# Patient Record
Sex: Female | Born: 1980 | Race: Black or African American | Hispanic: No | Marital: Married | Smoking: Never smoker
Health system: Southern US, Community
[De-identification: ages and names within clinical notes are randomized; demographics above are authoritative.]

## PROBLEM LIST (undated history)

## (undated) ENCOUNTER — Inpatient Hospital Stay (HOSPITAL_COMMUNITY): Payer: Self-pay

## (undated) DIAGNOSIS — R87619 Unspecified abnormal cytological findings in specimens from cervix uteri: Secondary | ICD-10-CM

## (undated) DIAGNOSIS — O24419 Gestational diabetes mellitus in pregnancy, unspecified control: Secondary | ICD-10-CM

## (undated) DIAGNOSIS — IMO0002 Reserved for concepts with insufficient information to code with codable children: Secondary | ICD-10-CM

## (undated) HISTORY — DX: Reserved for concepts with insufficient information to code with codable children: IMO0002

## (undated) HISTORY — PX: NO PAST SURGERIES: SHX2092

## (undated) HISTORY — DX: Unspecified abnormal cytological findings in specimens from cervix uteri: R87.619

---

## 2004-04-29 LAB — SICKLE CELL SCREEN: Sickle Cell Screen: NEGATIVE

## 2004-05-01 ENCOUNTER — Ambulatory Visit (HOSPITAL_COMMUNITY): Admission: RE | Admit: 2004-05-01 | Discharge: 2004-05-01 | Payer: Self-pay | Admitting: *Deleted

## 2004-06-02 ENCOUNTER — Inpatient Hospital Stay (HOSPITAL_COMMUNITY): Admission: AD | Admit: 2004-06-02 | Discharge: 2004-06-02 | Payer: Self-pay | Admitting: Obstetrics and Gynecology

## 2004-06-02 ENCOUNTER — Inpatient Hospital Stay (HOSPITAL_COMMUNITY): Admission: AD | Admit: 2004-06-02 | Discharge: 2004-06-02 | Payer: Self-pay | Admitting: *Deleted

## 2004-06-12 ENCOUNTER — Ambulatory Visit: Payer: Self-pay | Admitting: Family Medicine

## 2004-06-26 ENCOUNTER — Ambulatory Visit: Payer: Self-pay | Admitting: Family Medicine

## 2004-07-10 ENCOUNTER — Ambulatory Visit: Payer: Self-pay | Admitting: Family Medicine

## 2004-07-16 ENCOUNTER — Ambulatory Visit: Payer: Self-pay | Admitting: *Deleted

## 2004-07-23 ENCOUNTER — Ambulatory Visit: Payer: Self-pay | Admitting: *Deleted

## 2004-07-30 ENCOUNTER — Ambulatory Visit: Payer: Self-pay | Admitting: *Deleted

## 2004-08-06 ENCOUNTER — Ambulatory Visit: Payer: Self-pay | Admitting: Obstetrics & Gynecology

## 2004-08-13 ENCOUNTER — Inpatient Hospital Stay (HOSPITAL_COMMUNITY): Admission: AD | Admit: 2004-08-13 | Discharge: 2004-08-16 | Payer: Self-pay | Admitting: Obstetrics and Gynecology

## 2004-08-13 ENCOUNTER — Ambulatory Visit: Payer: Self-pay | Admitting: Obstetrics & Gynecology

## 2004-08-14 ENCOUNTER — Ambulatory Visit: Payer: Self-pay | Admitting: Obstetrics and Gynecology

## 2006-01-07 ENCOUNTER — Ambulatory Visit (HOSPITAL_COMMUNITY): Admission: RE | Admit: 2006-01-07 | Discharge: 2006-01-07 | Payer: Self-pay | Admitting: *Deleted

## 2006-01-19 ENCOUNTER — Ambulatory Visit (HOSPITAL_COMMUNITY): Admission: RE | Admit: 2006-01-19 | Discharge: 2006-01-19 | Payer: Self-pay | Admitting: *Deleted

## 2006-05-27 ENCOUNTER — Inpatient Hospital Stay (HOSPITAL_COMMUNITY): Admission: AD | Admit: 2006-05-27 | Discharge: 2006-05-27 | Payer: Self-pay | Admitting: Obstetrics and Gynecology

## 2006-05-27 ENCOUNTER — Ambulatory Visit: Payer: Self-pay | Admitting: Obstetrics and Gynecology

## 2006-06-03 ENCOUNTER — Ambulatory Visit: Payer: Self-pay | Admitting: *Deleted

## 2006-06-03 ENCOUNTER — Inpatient Hospital Stay (HOSPITAL_COMMUNITY): Admission: RE | Admit: 2006-06-03 | Discharge: 2006-06-06 | Payer: Self-pay | Admitting: Obstetrics & Gynecology

## 2007-10-25 ENCOUNTER — Inpatient Hospital Stay (HOSPITAL_COMMUNITY): Admission: AD | Admit: 2007-10-25 | Discharge: 2007-10-25 | Payer: Self-pay | Admitting: Obstetrics & Gynecology

## 2008-03-23 ENCOUNTER — Ambulatory Visit (HOSPITAL_COMMUNITY): Admission: RE | Admit: 2008-03-23 | Discharge: 2008-03-23 | Payer: Self-pay | Admitting: Family Medicine

## 2008-03-26 ENCOUNTER — Ambulatory Visit: Payer: Self-pay | Admitting: Obstetrics & Gynecology

## 2008-03-26 ENCOUNTER — Encounter: Admission: RE | Admit: 2008-03-26 | Discharge: 2008-05-21 | Payer: Self-pay | Admitting: Obstetrics & Gynecology

## 2008-04-02 ENCOUNTER — Ambulatory Visit: Payer: Self-pay | Admitting: Family Medicine

## 2008-04-12 ENCOUNTER — Ambulatory Visit: Payer: Self-pay | Admitting: Family Medicine

## 2008-04-16 ENCOUNTER — Ambulatory Visit: Payer: Self-pay | Admitting: Obstetrics & Gynecology

## 2008-04-23 ENCOUNTER — Ambulatory Visit (HOSPITAL_COMMUNITY): Admission: RE | Admit: 2008-04-23 | Discharge: 2008-04-23 | Payer: Self-pay | Admitting: Obstetrics & Gynecology

## 2008-04-23 ENCOUNTER — Ambulatory Visit: Payer: Self-pay | Admitting: Obstetrics & Gynecology

## 2008-04-26 ENCOUNTER — Ambulatory Visit: Payer: Self-pay | Admitting: Gynecology

## 2008-04-30 ENCOUNTER — Ambulatory Visit: Payer: Self-pay | Admitting: Family Medicine

## 2008-05-07 ENCOUNTER — Ambulatory Visit: Payer: Self-pay | Admitting: Obstetrics & Gynecology

## 2008-05-10 ENCOUNTER — Ambulatory Visit: Payer: Self-pay | Admitting: Family Medicine

## 2008-05-14 ENCOUNTER — Ambulatory Visit: Payer: Self-pay | Admitting: Obstetrics & Gynecology

## 2008-05-17 ENCOUNTER — Ambulatory Visit: Payer: Self-pay | Admitting: Obstetrics & Gynecology

## 2008-05-21 ENCOUNTER — Ambulatory Visit: Payer: Self-pay | Admitting: Obstetrics & Gynecology

## 2008-05-24 ENCOUNTER — Ambulatory Visit: Payer: Self-pay | Admitting: Gynecology

## 2008-05-28 ENCOUNTER — Ambulatory Visit (HOSPITAL_COMMUNITY): Admission: RE | Admit: 2008-05-28 | Discharge: 2008-05-28 | Payer: Self-pay | Admitting: Obstetrics & Gynecology

## 2008-05-28 ENCOUNTER — Ambulatory Visit: Payer: Self-pay | Admitting: Obstetrics & Gynecology

## 2008-05-29 ENCOUNTER — Ambulatory Visit: Payer: Self-pay | Admitting: Advanced Practice Midwife

## 2008-05-29 ENCOUNTER — Inpatient Hospital Stay (HOSPITAL_COMMUNITY): Admission: RE | Admit: 2008-05-29 | Discharge: 2008-05-31 | Payer: Self-pay | Admitting: Obstetrics & Gynecology

## 2008-06-08 ENCOUNTER — Ambulatory Visit: Payer: Self-pay | Admitting: Obstetrics & Gynecology

## 2008-06-08 ENCOUNTER — Inpatient Hospital Stay (HOSPITAL_COMMUNITY): Admission: AD | Admit: 2008-06-08 | Discharge: 2008-06-08 | Payer: Self-pay | Admitting: Obstetrics & Gynecology

## 2011-07-01 LAB — CBC
HCT: 37.9
MCHC: 34.9
MCV: 78
Platelets: 283
RDW: 13.1

## 2011-07-01 LAB — URINE MICROSCOPIC-ADD ON

## 2011-07-01 LAB — URINALYSIS, ROUTINE W REFLEX MICROSCOPIC
Bilirubin Urine: NEGATIVE
Glucose, UA: NEGATIVE
Ketones, ur: NEGATIVE
Nitrite: NEGATIVE
Specific Gravity, Urine: 1.01
Urobilinogen, UA: 0.2

## 2011-07-01 LAB — POCT PREGNANCY, URINE
Operator id: 113551
Preg Test, Ur: POSITIVE

## 2011-07-01 LAB — ABO/RH: ABO/RH(D): B POS

## 2011-07-09 LAB — POCT URINALYSIS DIP (DEVICE)
Bilirubin Urine: NEGATIVE
Bilirubin Urine: NEGATIVE
Bilirubin Urine: NEGATIVE
Glucose, UA: 100 — AB
Glucose, UA: NEGATIVE
Glucose, UA: NEGATIVE
Glucose, UA: NEGATIVE
Hgb urine dipstick: NEGATIVE
Hgb urine dipstick: NEGATIVE
Ketones, ur: 15 — AB
Ketones, ur: 15 — AB
Ketones, ur: 15 — AB
Ketones, ur: NEGATIVE
Nitrite: NEGATIVE
Nitrite: NEGATIVE
Nitrite: NEGATIVE
Nitrite: NEGATIVE
Nitrite: NEGATIVE
Operator id: 297281
Operator id: 148111
Operator id: 194561
Protein, ur: NEGATIVE
Specific Gravity, Urine: 1.02
Urobilinogen, UA: 1
pH: 6.5
pH: 7

## 2011-07-10 LAB — POCT URINALYSIS DIP (DEVICE)
Bilirubin Urine: NEGATIVE
Glucose, UA: NEGATIVE
Hgb urine dipstick: NEGATIVE
Hgb urine dipstick: NEGATIVE
Ketones, ur: 15 — AB
Ketones, ur: 40 — AB
Nitrite: NEGATIVE
Nitrite: NEGATIVE
Nitrite: NEGATIVE
Operator id: 194561
Operator id: 297281
Protein, ur: 30 — AB
Protein, ur: NEGATIVE
Protein, ur: NEGATIVE
Specific Gravity, Urine: 1.015
Specific Gravity, Urine: 1.02
Specific Gravity, Urine: 1.01
Urobilinogen, UA: 1
Urobilinogen, UA: 0.2
Urobilinogen, UA: 0.2
pH: 6.5

## 2011-10-13 NOTE — L&D Delivery Note (Addendum)
Delivery Note At 11:24 PM a viable female was delivered via  (Presentation: ;Left Occiput Anterior ) after approximately 30second shoulder dystocia resolved w/ mcroberts and suprapubic pressure.  Spontaneous cry after stimulation on mom's abdomen. APGAR:8/9 ; Weight: 9lb 2.7oz, Placenta status: Intact, Spontaneous.  Cord: 3 vessels with the following complications: None.    Anesthesia: None  Episiotomy: None Lacerations: None Suture Repair: n/a Est. Blood Loss (mL):  Mom to postpartum.  Baby to nursery-stable.  Plans to breast/bottlefeed, wants nexplanon.  Desires circumcision, self pay- undecided about inpatient vs. Outpatient  Marge Duncans 06/25/2012, 11:46 PM

## 2011-12-25 ENCOUNTER — Encounter (HOSPITAL_COMMUNITY): Payer: Self-pay | Admitting: *Deleted

## 2011-12-25 ENCOUNTER — Inpatient Hospital Stay (HOSPITAL_COMMUNITY)
Admission: AD | Admit: 2011-12-25 | Discharge: 2011-12-25 | Disposition: A | Discharge status: Home / Self Care | Payer: Self-pay | Source: Ambulatory Visit | Attending: Obstetrics & Gynecology | Admitting: Obstetrics & Gynecology

## 2011-12-25 DIAGNOSIS — O219 Vomiting of pregnancy, unspecified: Secondary | ICD-10-CM

## 2011-12-25 DIAGNOSIS — O210 Mild hyperemesis gravidarum: Secondary | ICD-10-CM | POA: Insufficient documentation

## 2011-12-25 DIAGNOSIS — Z3480 Encounter for supervision of other normal pregnancy, unspecified trimester: Secondary | ICD-10-CM

## 2011-12-25 DIAGNOSIS — O99891 Other specified diseases and conditions complicating pregnancy: Secondary | ICD-10-CM | POA: Insufficient documentation

## 2011-12-25 DIAGNOSIS — R109 Unspecified abdominal pain: Secondary | ICD-10-CM | POA: Insufficient documentation

## 2011-12-25 HISTORY — DX: Gestational diabetes mellitus in pregnancy, unspecified control: O24.419

## 2011-12-25 LAB — GLUCOSE, CAPILLARY

## 2011-12-25 LAB — URINALYSIS, ROUTINE W REFLEX MICROSCOPIC
Glucose, UA: NEGATIVE mg/dL
Hgb urine dipstick: NEGATIVE
Leukocytes, UA: NEGATIVE
Specific Gravity, Urine: 1.015 (ref 1.005–1.030)
pH: 6 (ref 5.0–8.0)

## 2011-12-25 LAB — POCT PREGNANCY, URINE: Preg Test, Ur: POSITIVE — AB

## 2011-12-25 LAB — WET PREP, GENITAL
Trich, Wet Prep: NONE SEEN
Yeast Wet Prep HPF POC: NONE SEEN

## 2011-12-25 MED ORDER — PRENATAL RX 60-1 MG PO TABS: 1.0000 | ORAL_TABLET | Freq: Every day | ORAL | Status: AC

## 2011-12-25 MED ORDER — PROMETHAZINE HCL 12.5 MG PO TABS: 12.5000 mg | ORAL_TABLET | Freq: Four times a day (QID) | ORAL | Status: AC | PRN

## 2011-12-25 NOTE — Discharge Instructions (Signed)
Prenatal Care [facility]    [facility]  [phone]     Phone: [phone]          [facility]                      [facility]  @[place]  [place]     Phone: [phone]  Phone: [phone]         [facility]     Phone: [phone]  Phone: [phone]           [facility] [place]                hone: [phone]  Phone: [phone]         [facility] Francoise Ceo      Phone: [phone]  Phone: [phone]         [facility] Dept[facility]                Phone: [phone]  Women's Health   [phone]    Family [address] [place])          Phone: [phone] [facility]   Phone: [phone] of Pregnancy A Antepartum care is very important. Be sure you see your doctor and get prenatal care as soon as you think you are pregnant. At this time, you will be tested for infection, genetic abnormalities and potential problems with you and the pregnancy. This is the time to discuss diet, exercise, work, medications, labor, pain medication during labor and the possibility of a cesarean delivery. Ask any questions that may concern you. It is important to see your doctor regularly throughout your pregnancy. Avoid exposure to toxic substances and chemicals - such as cleaning solvents, lead and mercury, some insecticides, and paint. Pregnant women should avoid exposure to paint fumes, and fumes that cause you to feel ill, dizzy or faint. When possible, it is a good idea to have a pre-pregnancy consultation with your caregiver to begin some important recommendations your caregiver suggests such as, taking folic acid, exercising, quitting smoking, avoiding alcoholic beverages, etc. B Breastfeeding is the healthiest choice for both you and your baby. It has many nutritional benefits for the  baby and health benefits for the mother. It also creates a very tight and loving bond between the baby and mother. Talk to your doctor, your family and friends, and your employer about how you choose to feed your baby and how they can support you in your decision. Not all birth defects can be prevented, but a woman can take actions that may increase her chance of having a healthy baby. Many birth defects happen very early in pregnancy, sometimes before a woman even knows she is pregnant. Birth defects or abnormalities of any child in your or the father's family should be discussed with your caregiver. Get a good support bra as your breast size changes. Wear it especially when you exercise and when nursing.  C Celebrate the news of your pregnancy with the your spouse/father and family. Childbirth classes are helpful to take for you and the spouse/father because it helps to understand what happens during the pregnancy, labor and delivery. Cesarean delivery should be discussed with your doctor so you are prepared for that possibility. The pros and cons of circumcision if it is a boy, should be discussed with your  pediatrician. Cigarette smoking during pregnancy can result in low birth weight babies. It has been associated with infertility, miscarriages, tubal pregnancies, infant death (mortality) and poor health (morbidity) in childhood. Additionally, cigarette smoking may cause long-term learning disabilities. If you smoke, you should try to quit before getting pregnant and not smoke during the pregnancy. Secondary smoke may also harm a mother and her developing baby. It is a good idea to ask people to stop smoking around you during your pregnancy and after the baby is born. Extra calcium is necessary when you are pregnant and is found in your prenatal vitamin, in dairy products, green leafy vegetables and in calcium supplements. D A healthy diet according to your current weight and height, along with vitamins and  mineral supplements should be discussed with your caregiver. Domestic abuse or violence should be made known to your doctor right away to get the situation corrected. Drink more water when you exercise to keep hydrated. Discomfort of your back and legs usually develops and progresses from the middle of the second trimester through to delivery of the baby. This is because of the enlarging baby and uterus, which may also affect your balance. Do not take illegal drugs. Illegal drugs can seriously harm the baby and you. Drink extra fluids (water is best) throughout pregnancy to help your body keep up with the increases in your blood volume. Drink at least 6 to 8 glasses of water, fruit juice, or milk each day. A good way to know you are drinking enough fluid is when your urine looks almost like clear water or is very light yellow.  E Eat healthy to get the nutrients you and your unborn baby need. Your meals should include the five basic food groups. Exercise (30 minutes of light to moderate exercise a day) is important and encouraged during pregnancy, if there are no medical problems or problems with the pregnancy. Exercise that causes discomfort or dizziness should be stopped and reported to your caregiver. Emotions during pregnancy can change from being ecstatic to depression and should be understood by you, your partner and your family. F Fetal screening with ultrasound, amniocentesis and monitoring during pregnancy and labor is common and sometimes necessary. Take 400 micrograms of folic acid daily both before, when possible, and during the first few months of pregnancy to reduce the risk of birth defects of the brain and spine. All women who could possibly become pregnant should take a vitamin with folic acid, every day. It is also important to eat a healthy diet with fortified foods (enriched grain products, including cereals, rice, breads, and pastas) and foods with natural sources of folate (orange juice,  green leafy vegetables, beans, peanuts, broccoli, asparagus, peas, and lentils). The father should be involved with all aspects of the pregnancy including, the prenatal care, childbirth classes, labor, delivery, and postpartum time. Fathers may also have emotional concerns about being a father, financial needs, and raising a family. G Genetic testing should be done appropriately. It is important to know your family and the father's history. If there have been problems with pregnancies or birth defects in your family, report these to your doctor. Also, genetic counselors can talk with you about the information you might need in making decisions about having a family. You can call a major medical center in your area for help in finding a board-certified genetic counselor. Genetic testing and counseling should be done before pregnancy when possible, especially if there is a history of problems in the mother's  or father's family. Certain ethnic backgrounds are more at risk for genetic defects. H Get familiar with the hospital where you will be having your baby. Get to know how long it takes to get there, the labor and delivery area, and the hospital procedures. Be sure your medical insurance is accepted there. Get your home ready for the baby including, clothes, the baby's room (when possible), furniture and car seat. Hand washing is important throughout the day, especially after handling raw meat and poultry, changing the baby's diaper or using the bathroom. This can help prevent the spread of many bacteria and viruses that cause infection. Your hair may become dry and thinner, but will return to normal a few weeks after the baby is born. Heartburn is a common problem that can be treated by taking antacids recommended by your caregiver, eating smaller meals 5 or 6 times a day, not drinking liquids when eating, drinking between meals and raising the head of your bed 2 to 3 inches. I Insurance to cover you, the  baby, doctor and hospital should be reviewed so that you will be prepared to pay any costs not covered by your insurance plan. If you do not have medical insurance, there are usually clinics and services available for you in your community. Take 30 milligrams of iron during your pregnancy as prescribed by your doctor to reduce the risk of low red blood cells (anemia) later in pregnancy. All women of childbearing age should eat a diet rich in iron. J There should be a joint effort for the mother, father and any other children to adapt to the pregnancy financially, emotionally, and psychologically during the pregnancy. Join a support group for moms-to-be. Or, join a class on parenting or childbirth. Have the family participate when possible. K Know your limits. Let your caregiver know if you experience any of the following:   Pain of any kind.   Strong cramps.   You develop a lot of weight in a short period of time (5 pounds in 3 to 5 days).   Vaginal bleeding, leaking of amniotic fluid.   Headache, vision problems.   Dizziness, fainting, shortness of breath.   Chest pain.   Fever of 102 F (38.9 C) or higher.   Gush of clear fluid from your vagina.   Painful urination.   Domestic violence.   Irregular heartbeat (palpitations).   Rapid beating of the heart (tachycardia).   Constant feeling sick to your stomach (nauseous) and vomiting.   Trouble walking, fluid retention (edema).   Muscle weakness.   If your baby has decreased activity.   Persistent diarrhea.   Abnormal vaginal discharge.   Uterine contractions at 20-minute intervals.   Back pain that travels down your leg.  L Learn and practice that what you eat and drink should be in moderation and healthy for you and your baby. Legal drugs such as alcohol and caffeine are important issues for pregnant women. There is no safe amount of alcohol a woman can drink while pregnant. Fetal alcohol syndrome, a disorder  characterized by growth retardation, facial abnormalities, and central nervous system dysfunction, is caused by a woman's use of alcohol during pregnancy. Caffeine, found in tea, coffee, soft drinks and chocolate, should also be limited. Be sure to read labels when trying to cut down on caffeine during pregnancy. More than 200 foods, beverages, and over-the-counter medications contain caffeine and have a high salt content! There are coffees and teas that do not contain caffeine. M Medical  conditions such as diabetes, epilepsy, and high blood pressure should be treated and kept under control before pregnancy when possible, but especially during pregnancy. Ask your caregiver about any medications that may need to be changed or adjusted during pregnancy. If you are currently taking any medications, ask your caregiver if it is safe to take them while you are pregnant or before getting pregnant when possible. Also, be sure to discuss any herbs or vitamins you are taking. They are medicines, too! Discuss with your doctor all medications, prescribed and over-the-counter, that you are taking. During your prenatal visit, discuss the medications your doctor may give you during labor and delivery. N Never be afraid to ask your doctor or caregiver questions about your health, the progress of the pregnancy, family problems, stressful situations, and recommendation for a pediatrician, if you do not have one. It is better to take all precautions and discuss any questions or concerns you may have during your office visits. It is a good idea to write down your questions before you visit the doctor. O Over-the-counter cough and cold remedies may contain alcohol or other ingredients that should be avoided during pregnancy. Ask your caregiver about prescription, herbs or over-the-counter medications that you are taking or may consider taking while pregnant.  P Physical activity during pregnancy can benefit both you and your  baby by lessening discomfort and fatigue, providing a sense of well-being, and increasing the likelihood of early recovery after delivery. Light to moderate exercise during pregnancy strengthens the belly (abdominal) and back muscles. This helps improve posture. Practicing yoga, walking, swimming, and cycling on a stationary bicycle are usually safe exercises for pregnant women. Avoid scuba diving, exercise at high altitudes (over 3000 feet), skiing, horseback riding, contact sports, etc. Always check with your doctor before beginning any kind of exercise, especially during pregnancy and especially if you did not exercise before getting pregnant. Q Queasiness, stomach upset and morning sickness are common during pregnancy. Eating a couple of crackers or dry toast before getting out of bed. Foods that you normally love may make you feel sick to your stomach. You may need to substitute other nutritious foods. Eating 5 or 6 small meals a day instead of 3 large ones may make you feel better. Do not drink with your meals, drink between meals. Questions that you have should be written down and asked during your prenatal visits. R Read about and make plans to baby-proof your home. There are important tips for making your home a safer environment for your baby. Review the tips and make your home safer for you and your baby. Read food labels regarding calories, salt and fat content in the food. S Saunas, hot tubs, and steam rooms should be avoided while you are pregnant. Excessive high heat may be harmful during your pregnancy. Your caregiver will screen and examine you for sexually transmitted diseases and genetic disorders during your prenatal visits. Learn the signs of labor. Sexual relations while pregnant is safe unless there is a medical or pregnancy problem and your caregiver advises against it. T Traveling long distances should be avoided especially in the third trimester of your pregnancy. If you do have to  travel out of state, be sure to take a copy of your medical records and medical insurance plan with you. You should not travel long distances without seeing your doctor first. Most airlines will not allow you to travel after 36 weeks of pregnancy. Toxoplasmosis is an infection caused by a parasite that  can seriously harm an unborn baby. Avoid eating undercooked meat and handling cat litter. Be sure to wear gloves when gardening. Tingling of the hands and fingers is not unusual and is due to fluid retention. This will go away after the baby is born. U Womb (uterus) size increases during the first trimester. Your kidneys will begin to function more efficiently. This may cause you to feel the need to urinate more often. You may also leak urine when sneezing, coughing or laughing. This is due to the growing uterus pressing against your bladder, which lies directly in front of and slightly under the uterus during the first few months of pregnancy. If you experience burning along with frequency of urination or bloody urine, be sure to tell your doctor. The size of your uterus in the third trimester may cause a problem with your balance. It is advisable to maintain good posture and avoid wearing high heels during this time. An ultrasound of your baby may be necessary during your pregnancy and is safe for you and your baby. V Vaccinations are an important concern for pregnant women. Get needed vaccines before pregnancy. Center for Disease Control ([employer]) has clear guidelines for the use of vaccines during pregnancy. Review the list, be sure to discuss it with your doctor. Prenatal vitamins are helpful and healthy for you and the baby. Do not take extra vitamins except what is recommended. Taking too much of certain vitamins can cause overdose problems. Continuous vomiting should be reported to your caregiver. Varicose veins may appear especially if there is a family history of varicose veins. They should subside  after the delivery of the baby. Support hose helps if there is leg discomfort. W Being overweight or underweight during pregnancy may cause problems. Try to get within 15 pounds of your ideal weight before pregnancy. Remember, pregnancy is not a time to be dieting! Do not stop eating or start skipping meals as your weight increases. Both you and your baby need the calories and nutrition you receive from a healthy diet. Be sure to consult with your doctor about your diet. There is a formula and diet plan available depending on whether you are overweight or underweight. Your caregiver or nutritionist can help and advise you if necessary. X Avoid X-rays. If you must have dental work or diagnostic tests, tell your dentist or physician that you are pregnant so that extra care can be taken. X-rays should only be taken when the risks of not taking them outweigh the risk of taking them. If needed, only the minimum amount of radiation should be used. When X-rays are necessary, protective lead shields should be used to cover areas of the body that are not being X-rayed. Y Your baby loves you. Breastfeeding your baby creates a loving and very close bond between the two of you. Give your baby a healthy environment to live in while you are pregnant. Infants and children require constant care and guidance. Their health and safety should be carefully watched at all times. After the baby is born, rest or take a nap when the baby is sleeping. Z Get your ZZZs. Be sure to get plenty of rest. Resting on your side as often as possible, especially on your left side is advised. It provides the best circulation to your baby and helps reduce swelling. Try taking a nap for 30 to 45 minutes in the afternoon when possible. After the baby is born rest or take a nap when the baby is sleeping. Try  elevating your feet for that amount of time when possible. It helps the circulation in your legs and helps reduce swelling.  Most information  courtesy of the CDC. Document Released: 09/28/2005 Document Revised: 09/17/2011 Document Reviewed: 06/12/2009 [facility] Patient Information 2012 [employer]0

## 2011-12-25 NOTE — MAU Note (Signed)
No period for 3 months. C/O abdominal pain and bloating. Had a baby last August. States periods not regular since then.

## 2011-12-25 NOTE — MAU Provider Note (Signed)
Erika Hamadou30 y.[id number] @[redacted]w[redacted]d  Chief Complaint  Patient presents with  . Abdominal Pain    SUBJECTIVE  HPI: 2 wk hx of intermittent bilateral lower abd crampy pain, a little better when she lies down. Also with dysuria and urinary frequency and urgency. On OTC Monistat for vaginal itch, improved over past few days. Also persistent nausea and some vomiting.Hx ID GDM with P1 only. NPC.   Past Medical History  Diagnosis Date  . Gestational diabetes    Past Surgical History  Procedure Date  . No past surgeries    History   Social History  . Marital Status: Married    Spouse Name: N/A    Number of Children: N/A  . Years of Education: N/A   Occupational History  . Not on file.   Social History Main Topics  . Smoking status: Never Smoker   . Smokeless tobacco: Not on file  . Alcohol Use: No  . Drug Use: No  . Sexually Active: Yes   Other Topics Concern  . Not on file   Social History Narrative  . No narrative on file   No current facility-administered medications on file prior to encounter.   No current outpatient prescriptions on file prior to encounter.   No Known Allergies  ROS: Pertinent items in HPI  OBJECTIVE Blood pressure 134/86, pulse 107, temperature 99.1 F (37.3 C), temperature source Oral, resp. rate 18, last menstrual period 09/26/2011, SpO2 100.00%. GENERAL: Well-developed, well-nourished female in no acute distress.  ABDOMEN: Soft, nontender EXTREMITIES: Nontender, no edema EXTERNAL GENITALIA: normal Speculum  Exam: physiologic discharge, cx clean,long/closed/high, uterus 12 wk size    LAB RESULTS Results for orders placed during the hospital encounter of 12/25/11 (from the past 24 hour(s))  URINALYSIS, ROUTINE W REFLEX MICROSCOPIC     Status: Normal   Collection Time   12/25/11  8:56 AM      Component Value Range   Color, Urine YELLOW  YELLOW    APPearance CLEAR  CLEAR    Specific Gravity, Urine 1.015  1.005 - 1.030    pH 6.0  5.0 -  8.0    Glucose, UA NEGATIVE  NEGATIVE (mg/dL)   Hgb urine dipstick NEGATIVE  NEGATIVE    Bilirubin Urine NEGATIVE  NEGATIVE    Ketones, ur NEGATIVE  NEGATIVE (mg/dL)   Protein, ur NEGATIVE  NEGATIVE (mg/dL)   Urobilinogen, UA 0.2  0.0 - 1.0 (mg/dL)   Nitrite NEGATIVE  NEGATIVE    Leukocytes, UA NEGATIVE  NEGATIVE   POCT PREGNANCY, URINE     Status: Abnormal   Collection Time   12/25/11  8:59 AM      Component Value Range   Preg Test, Ur POSITIVE (*) NEGATIVE   WET PREP, GENITAL     Status: Abnormal   Collection Time   12/25/11 10:05 AM      Component Value Range   Yeast Wet Prep HPF POC NONE SEEN  NONE SEEN    Trich, Wet Prep NONE SEEN  NONE SEEN    Clue Cells Wet Prep HPF POC NONE SEEN  NONE SEEN    WBC, Wet Prep HPF POC FEW (*) NONE SEEN   GLUCOSE, CAPILLARY     Status: Abnormal   Collection Time   12/25/11 10:15 AM      Component Value Range   Glucose-Capillary 101 (*) 70 - 99 (mg/dL)   Comment 1 Notify RN      IMAGING  Bedside US: normal FHR and fetal activity ASSESSMENT  [id number] viable IOUP at [redacted]w[redacted]d, NPC  PLAN PNV, list of providers, preg verification letter

## 2011-12-26 LAB — URINE CULTURE
Colony Count: 10000
Culture  Setup Time: 201303151336

## 2011-12-26 LAB — GC/CHLAMYDIA PROBE AMP, GENITAL: GC Probe Amp, Genital: NEGATIVE

## 2011-12-27 ENCOUNTER — Other Ambulatory Visit: Payer: Self-pay | Admitting: Advanced Practice Midwife

## 2011-12-27 DIAGNOSIS — O2340 Unspecified infection of urinary tract in pregnancy, unspecified trimester: Secondary | ICD-10-CM

## 2011-12-27 MED ORDER — AMOXICILLIN 500 MG PO CAPS: 500.0000 mg | ORAL_CAPSULE | Freq: Two times a day (BID) | ORAL | Status: AC

## 2011-12-27 NOTE — Progress Notes (Signed)
Pt seen in MAU with crampy low abd pain on 3/15. UA negative, urine culture sent. Urine culture shows + GBS. Rx sent to pharmacy for Amoxicillin 500 mg 1 po bid x 7 days. Left message on patient's mobile number to call MAU on 3/17 at 6:10 PM.

## 2012-03-23 ENCOUNTER — Other Ambulatory Visit (HOSPITAL_COMMUNITY): Payer: Self-pay | Admitting: Family

## 2012-03-23 DIAGNOSIS — Z3689 Encounter for other specified antenatal screening: Secondary | ICD-10-CM

## 2012-03-23 LAB — OB RESULTS CONSOLE GBS: GBS: POSITIVE

## 2012-03-23 LAB — OB RESULTS CONSOLE GC/CHLAMYDIA: Gonorrhea: NEGATIVE

## 2012-03-23 LAB — OB RESULTS CONSOLE VARICELLA ZOSTER ANTIBODY, IGG
Varicella: IMMUNE
Varicella: IMMUNE

## 2012-03-23 LAB — OB RESULTS CONSOLE RUBELLA ANTIBODY, IGM: Rubella: IMMUNE

## 2012-03-23 LAB — OB RESULTS CONSOLE ABO/RH: RH Type: POSITIVE

## 2012-03-23 LAB — OB RESULTS CONSOLE HIV ANTIBODY (ROUTINE TESTING): HIV: NONREACTIVE

## 2012-03-23 LAB — CYSTIC FIBROSIS DIAGNOSTIC STUDY: INTERPRETATION-CFDNA: NEGATIVE

## 2012-03-23 LAB — GLUCOSE, 1 HOUR: Glucose, 1 hour: 256

## 2012-03-23 LAB — OB RESULTS CONSOLE HEPATITIS B SURFACE ANTIGEN: Hepatitis B Surface Ag: NEGATIVE

## 2012-03-28 ENCOUNTER — Other Ambulatory Visit: Payer: Self-pay

## 2012-03-28 ENCOUNTER — Encounter: Payer: Medicaid Other | Attending: Obstetrics and Gynecology | Admitting: Dietician

## 2012-03-28 DIAGNOSIS — O99810 Abnormal glucose complicating pregnancy: Secondary | ICD-10-CM | POA: Insufficient documentation

## 2012-03-28 DIAGNOSIS — Z713 Dietary counseling and surveillance: Secondary | ICD-10-CM | POA: Insufficient documentation

## 2012-03-28 LAB — POCT URINALYSIS DIP (DEVICE)
Glucose, UA: 250 mg/dL — AB
Hgb urine dipstick: NEGATIVE
Nitrite: NEGATIVE
Protein, ur: NEGATIVE mg/dL
Specific Gravity, Urine: 1.02 (ref 1.005–1.030)
Urobilinogen, UA: 0.2 mg/dL (ref 0.0–1.0)

## 2012-03-28 NOTE — Progress Notes (Unsigned)
Diabetes Education:  Seen for first time.  One Glucose at 167.  Completed review of the physiology of GDM.  Review of the diet for GDM.  Provided [place] handout which she stated she could read.  Nutirtion, Diabetes and Pregnancy  And the Carb Counting Guide.  Medicaid is not approved.  Provided a True Track Meter Kit Lot [id number] EXP: 2014/05/11 and 1 box of Strips Lot [id number] EXP: 2014/07/11 and 1 box lancets Lot: [id number] Exp: 2016/01/21.  Completed a return demonstration and the blood glucose following breakfast was 169 following a meal of cereal and milk.  Instructed to bring meter and glucose log to every clinic appointment.  Erika Matalyn Nawaz, RN, RD, CDE

## 2012-03-29 ENCOUNTER — Ambulatory Visit (HOSPITAL_COMMUNITY)
Admission: RE | Admit: 2012-03-29 | Discharge: 2012-03-29 | Disposition: A | Discharge status: Home / Self Care | Payer: Medicaid Other | Source: Ambulatory Visit | Attending: Family | Admitting: Family

## 2012-03-29 DIAGNOSIS — O358XX0 Maternal care for other (suspected) fetal abnormality and damage, not applicable or unspecified: Secondary | ICD-10-CM | POA: Insufficient documentation

## 2012-03-29 DIAGNOSIS — Z1389 Encounter for screening for other disorder: Secondary | ICD-10-CM | POA: Insufficient documentation

## 2012-03-29 DIAGNOSIS — Z363 Encounter for antenatal screening for malformations: Secondary | ICD-10-CM | POA: Insufficient documentation

## 2012-03-29 DIAGNOSIS — Z3689 Encounter for other specified antenatal screening: Secondary | ICD-10-CM

## 2012-03-29 DIAGNOSIS — O24419 Gestational diabetes mellitus in pregnancy, unspecified control: Secondary | ICD-10-CM

## 2012-03-30 DIAGNOSIS — O24419 Gestational diabetes mellitus in pregnancy, unspecified control: Secondary | ICD-10-CM

## 2012-04-04 ENCOUNTER — Ambulatory Visit (INDEPENDENT_AMBULATORY_CARE_PROVIDER_SITE_OTHER): Payer: Medicaid Other | Admitting: Obstetrics & Gynecology

## 2012-04-04 ENCOUNTER — Encounter: Payer: Medicaid Other | Admitting: Dietician

## 2012-04-04 VITALS — BP 109/63 | Temp 98.0°F | Ht 70.0 in | Wt 191.8 lb

## 2012-04-04 DIAGNOSIS — O0930 Supervision of pregnancy with insufficient antenatal care, unspecified trimester: Secondary | ICD-10-CM | POA: Insufficient documentation

## 2012-04-04 DIAGNOSIS — O0990 Supervision of high risk pregnancy, unspecified, unspecified trimester: Secondary | ICD-10-CM

## 2012-04-04 DIAGNOSIS — O24419 Gestational diabetes mellitus in pregnancy, unspecified control: Secondary | ICD-10-CM

## 2012-04-04 DIAGNOSIS — O99810 Abnormal glucose complicating pregnancy: Secondary | ICD-10-CM

## 2012-04-04 LAB — POCT URINALYSIS DIP (DEVICE)
Bilirubin Urine: NEGATIVE
Glucose, UA: NEGATIVE mg/dL
Hgb urine dipstick: NEGATIVE
Leukocytes, UA: NEGATIVE
Nitrite: NEGATIVE
Urobilinogen, UA: 0.2 mg/dL (ref 0.0–1.0)

## 2012-04-04 MED ORDER — GLYBURIDE 2.5 MG PO TABS: 2.5000 mg | ORAL_TABLET | Freq: Two times a day (BID) | ORAL | Status: DC

## 2012-04-04 NOTE — Patient Instructions (Signed)
Gestational Diabetes Mellitus Gestational diabetes mellitus (GDM) is diabetes that occurs only during pregnancy. This happens when the body cannot properly handle the glucose (sugar) that increases in the blood after eating. During pregnancy, insulin resistance (reduced sensitivity to insulin) occurs because of the release of hormones from the placenta. Usually, the pancreas of pregnant women produces enough insulin to overcome the resistance that occurs. However, in gestational diabetes, the insulin is there but it does not work effectively. If the resistance is severe enough that the pancreas does not produce enough insulin, extra glucose builds up in the blood.  WHO IS AT RISK FOR DEVELOPING GESTATIONAL DIABETES?  Women with a history of diabetes in the family.   Women over age 25.   Women who are overweight.   Women in certain ethnic groups (Hispanic, African American, Native American, Asian and Pacific Islander).  WHAT CAN HAPPEN TO THE BABY? If the mother's blood glucose is too high while she is pregnant, the extra sugar will travel through the umbilical cord to the baby. Some of the problems the baby may have are:  Large Baby - If the baby receives too much sugar, the baby will gain more weight. This may cause the baby to be too large to be born normally (vaginally) and a Cesarean section (C-section) may be needed.   Low Blood Glucose (hypoglycemia) - The baby makes extra insulin, in response to the extra sugar its gets from its mother. When the baby is born and no longer needs this extra insulin, the baby's blood glucose level may drop.   Jaundice (yellow coloring of the skin and eyes) - This is fairly common in babies. It is caused from a build-up of the chemical called bilirubin. This is rarely serious, but is seen more often in babies whose mothers had gestational diabetes.  RISKS TO THE MOTHER Women who have had gestational diabetes may be at higher risk for some problems,  including:  Preeclampsia or toxemia, which includes problems with high blood pressure. Blood pressure and protein levels in the urine must be checked frequently.   Infections.   Cesarean section (C-section) for delivery.   Developing Type 2 diabetes later in life. About 30-50% will develop diabetes later, especially if obese.  DIAGNOSIS  The hormones that cause insulin resistance are highest at about 24-28 weeks of pregnancy. If symptoms are experienced, they are much like symptoms you would normally expect during pregnancy.  GDM is often diagnosed using a two part method: 1. After 24-28 weeks of pregnancy, the woman drinks a glucose solution and takes a blood test. If the glucose level is high, a second test will be given.  2. Oral Glucose Tolerance Test (OGTT) which is 3 hours long - After not eating overnight, the blood glucose is checked. The woman drinks a glucose solution, and hourly blood glucose tests are taken.  If the woman has risk factors for GDM, the caregiver may test earlier than 24 weeks of pregnancy. TREATMENT  Treatment of GDM is directed at keeping the mother's blood glucose level normal, and may include:  Meal planning.   Taking insulin or other medicine to control your blood glucose level.   Exercise.   Keeping a daily record of the foods you eat.   Blood glucose monitoring and keeping a record of your blood glucose levels.   May monitor ketone levels in the urine, although this is no longer considered necessary in most pregnancies.  HOME CARE INSTRUCTIONS  While you are pregnant:    Follow your caregiver's advice regarding your prenatal appointments, meal planning, exercise, medicines, vitamins, blood and other tests, and physical activities.   Keep a record of your meals, blood glucose tests, and the amount of insulin you are taking (if any). Show this to your caregiver at every prenatal visit.   If you have GDM, you may have problems with hypoglycemia (low  blood glucose). You may suspect this if you become suddenly dizzy, feel shaky, and/or weak. If you think this is happening and you have a glucose meter, try to test your blood glucose level. Follow your caregiver's advice for when and how to treat your low blood glucose. Generally, the 15:15 rule is followed: Treat by consuming 15 grams of carbohydrates, wait 15 minutes, and recheck blood glucose. Examples of 15 grams of carbohydrates are:   1 cup skim or low-fat milk.    cup juice.   3-4 glucose tablets.   5-6 hard candies.   1 small box raisins.    cup regular soda pop.   Practice good hygiene, to avoid infections.   Do not smoke.  SEEK MEDICAL CARE IF:   You develop abnormal vaginal discharge, with or without itching.   You become weak and tired more than expected.   You seem to sweat a lot.   You have a sudden increase in weight, 5 pounds or more in one week.   You are losing weight, 3 pounds or more in a week.   Your blood glucose level is high, and you need instructions on what to do about it.  SEEK IMMEDIATE MEDICAL CARE IF:   You develop a severe headache.   You faint or pass out.   You develop nausea and vomiting.   You become disoriented or confused.   You have a convulsion.   You develop vision problems.   You develop stomach pain.   You develop vaginal bleeding.   You develop uterine contractions.   You have leaking or a gush of fluid from the vagina.  AFTER YOU HAVE THE BABY:  Go to all of your follow-up appointments, and have blood tests as advised by your caregiver.   Maintain a healthy lifestyle, to prevent diabetes in the future. This includes:   Following a healthy meal plan.   Controlling your weight.   Getting enough exercise and proper rest.   Do not smoke.   Breastfeed your baby if you can. This will lower the chance of you and your baby developing diabetes later in life.  For more information about diabetes, go to the American  Diabetes Association at: www.americandiabetesassociation.org. For more information about gestational diabetes, go to the American Congress of Obstetricians and Gynecologists at: www.acog.org. Document Released: 01/04/2001 Document Revised: 09/17/2011 Document Reviewed: 07/29/2009 [employer] Patient Information 2012 [employer]. 

## 2012-04-04 NOTE — Addendum Note (Signed)
Addended by: Faythe Casa on: 04/04/2012 04:02 PM   Modules accepted: Orders

## 2012-04-04 NOTE — Progress Notes (Signed)
First visit in [facility] this pregnancy. Had GDM prior pregnancy, late prenatal care.  Normal anatomy, EFW 81% on 03/29/12 scan.  Has meter from last time, started to check sugars.  Fasting 112-131, 2hr postprandials B: 114-169; L 99-180, D 110-189.  Discussed with Maggie (DM educator) who said they may be falsely elevated given that the battery is old; patient given a new meter. She was insulin dependent last pregnancy; and she reports adhering to a diabetic diet. Prescribed Glyburide 2.5 mg po bid, reevaluate in one week.  No other complaints or concerns.  Fetal movement and labor precautions reviewed.

## 2012-04-04 NOTE — Progress Notes (Signed)
Pulse 110 Patient reports some pelvic pressure and occasional contractions

## 2012-04-04 NOTE — Progress Notes (Signed)
Diabetes Education:  Review of diet and need to get the daily walking in.  Has elevated blood glucose across the day, fasting and post meal.  Review of dietary intake and recommended no cereal for breakfast, use as afternoon snack.  At mealtime, limit cooked rice to 2/3 cup rather than the 1 cup for lunch and dinner and to increase her intake of the non-starchy vegetables.  In previous pregnancy, she eventually used insulin to control her glucose.  Her desire is to try to not use insulin with this pregnancy.  She wished to not continuing to have GDM with each pregnancy.  To start glyburide 2.5 mg BID.  Will f/u next Monday.  Maggie Kylo Gavin, RN, RD, CDE

## 2012-04-07 LAB — CULTURE, OB URINE: Colony Count: 10000

## 2012-04-08 ENCOUNTER — Encounter: Payer: Self-pay | Admitting: Advanced Practice Midwife

## 2012-04-08 DIAGNOSIS — O99820 Streptococcus B carrier state complicating pregnancy: Secondary | ICD-10-CM | POA: Insufficient documentation

## 2012-04-11 ENCOUNTER — Ambulatory Visit (INDEPENDENT_AMBULATORY_CARE_PROVIDER_SITE_OTHER): Payer: Medicaid Other | Admitting: Obstetrics and Gynecology

## 2012-04-11 ENCOUNTER — Encounter: Payer: Self-pay | Admitting: Obstetrics and Gynecology

## 2012-04-11 VITALS — BP 103/63 | Temp 97.5°F | Wt 192.6 lb

## 2012-04-11 DIAGNOSIS — B951 Streptococcus, group B, as the cause of diseases classified elsewhere: Secondary | ICD-10-CM

## 2012-04-11 DIAGNOSIS — O24419 Gestational diabetes mellitus in pregnancy, unspecified control: Secondary | ICD-10-CM

## 2012-04-11 DIAGNOSIS — O0930 Supervision of pregnancy with insufficient antenatal care, unspecified trimester: Secondary | ICD-10-CM

## 2012-04-11 DIAGNOSIS — O99810 Abnormal glucose complicating pregnancy: Secondary | ICD-10-CM

## 2012-04-11 DIAGNOSIS — O99820 Streptococcus B carrier state complicating pregnancy: Secondary | ICD-10-CM

## 2012-04-11 DIAGNOSIS — O0990 Supervision of high risk pregnancy, unspecified, unspecified trimester: Secondary | ICD-10-CM

## 2012-04-11 LAB — POCT URINALYSIS DIP (DEVICE)
Glucose, UA: NEGATIVE mg/dL
Leukocytes, UA: NEGATIVE
Nitrite: NEGATIVE
Protein, ur: NEGATIVE mg/dL
Specific Gravity, Urine: 1.02 (ref 1.005–1.030)
Urobilinogen, UA: 0.2 mg/dL (ref 0.0–1.0)

## 2012-04-11 MED ORDER — GLYBURIDE 2.5 MG PO TABS: 5.0000 mg | ORAL_TABLET | Freq: Two times a day (BID) | ORAL | Status: DC

## 2012-04-11 NOTE — Progress Notes (Signed)
P=101, c/o pelvic pressure sometimes that she has been feeling for a while, c/o contractions  Sometimes, states not everyday,

## 2012-04-11 NOTE — Progress Notes (Signed)
CBG f 103-127, 2hr pp 81-174 (2 within range). Patient doing well without complaints. Informed of poorly controlled CBG and will increase glyburide to 5 mg BID. Patient informed that she may need to be started on insulin despite our best efforts. FM/PTL precautions reviewed

## 2012-04-21 ENCOUNTER — Telehealth: Payer: Self-pay | Admitting: Obstetrics and Gynecology

## 2012-04-21 NOTE — Telephone Encounter (Signed)
Patient called requesting test strips; she only has 2 strips left. Patient gets it from the clinic. Advised to come to the clinic today or tomorrow and we will supply. Patient agrees.

## 2012-04-25 ENCOUNTER — Encounter: Payer: Self-pay | Admitting: Family Medicine

## 2012-04-25 ENCOUNTER — Ambulatory Visit (INDEPENDENT_AMBULATORY_CARE_PROVIDER_SITE_OTHER): Payer: Medicaid Other | Admitting: Family Medicine

## 2012-04-25 VITALS — BP 104/65 | Temp 98.1°F | Wt 191.7 lb

## 2012-04-25 DIAGNOSIS — O99810 Abnormal glucose complicating pregnancy: Secondary | ICD-10-CM

## 2012-04-25 DIAGNOSIS — O24419 Gestational diabetes mellitus in pregnancy, unspecified control: Secondary | ICD-10-CM

## 2012-04-25 LAB — POCT URINALYSIS DIP (DEVICE)
Glucose, UA: NEGATIVE mg/dL
Hgb urine dipstick: NEGATIVE
Nitrite: NEGATIVE
Specific Gravity, Urine: 1.02 (ref 1.005–1.030)
Urobilinogen, UA: 0.2 mg/dL (ref 0.0–1.0)
pH: 7 (ref 5.0–8.0)

## 2012-04-25 MED ORDER — ACCU-CHEK NANO SMARTVIEW W/DEVICE KIT: 1.0000 [IU] | PACK | Status: DC

## 2012-04-25 MED ORDER — GLUCOSE BLOOD VI STRP: ORAL_STRIP | Status: DC

## 2012-04-25 MED ORDER — GLYBURIDE 5 MG PO TABS: 10.0000 mg | ORAL_TABLET | Freq: Two times a day (BID) | ORAL | Status: DC

## 2012-04-25 MED ORDER — ACCU-CHEK FASTCLIX LANCETS MISC: 1.0000 [IU] | Freq: Four times a day (QID) | Status: DC

## 2012-04-25 NOTE — Progress Notes (Signed)
FBS 93-121 2 hour pp 74-181--Lengthy discussion had with pt. About elevated BS.  She desires to not go on insulin.  Reports Glyburide is giving her diarrhea.  Discussed addition of Glucophage, likely to increase this effect.  Offered insulin, which she does not want.  Will increase Glyburide to 10 mg bid and if no improvement next week, she will go on insulin. U/S for growth Start 2x/wk testing at 32 wks.

## 2012-04-25 NOTE — Progress Notes (Signed)
U/S scheduled April 27, 2012 at 215pm.

## 2012-04-25 NOTE — Progress Notes (Signed)
Pulse: 104 Pt has low back pain and contractions.

## 2012-04-25 NOTE — Patient Instructions (Addendum)
You should increase your glyburide to 10 mg twice daily.  If your old prescription (2.5 mg tablet) is used this would mean 4 tablets twice daily.  Your new prescription (5mg  tablets) is for 2 tablets twice daily.  Gestational Diabetes Mellitus Gestational diabetes mellitus (GDM) is diabetes that occurs only during pregnancy. This happens when the body cannot properly handle the glucose (sugar) that increases in the blood after eating. During pregnancy, insulin resistance (reduced sensitivity to insulin) occurs because of the release of hormones from the placenta. Usually, the pancreas of pregnant women produces enough insulin to overcome the resistance that occurs. However, in gestational diabetes, the insulin is there but it does not work effectively. If the resistance is severe enough that the pancreas does not produce enough insulin, extra glucose builds up in the blood.  WHO IS AT RISK FOR DEVELOPING GESTATIONAL DIABETES?  Women with a history of diabetes in the family.   Women over age 13.   Women who are overweight.   Women in certain ethnic groups (Hispanic, African American, Native American, [place] and [place]er).  WHAT CAN HAPPEN TO THE BABY? If the mother's blood glucose is too high while she is pregnant, the extra sugar will travel through the umbilical cord to the baby. Some of the problems the baby may have are:  Large Baby - If the baby receives too much sugar, the baby will gain more weight. This may cause the baby to be too large to be born normally (vaginally) and a Cesarean section (C-section) may be needed.   Low Blood Glucose (hypoglycemia) - The baby makes extra insulin, in response to the extra sugar its gets from its mother. When the baby is born and no longer needs this extra insulin, the baby's blood glucose level may drop.   Jaundice (yellow coloring of the skin and eyes) - This is fairly common in babies. It is caused from a build-up of the chemical called  bilirubin. This is rarely serious, but is seen more often in babies whose mothers had gestational diabetes.  RISKS TO THE MOTHER Women who have had gestational diabetes may be at higher risk for some problems, including:  Preeclampsia or toxemia, which includes problems with high blood pressure. Blood pressure and protein levels in the urine must be checked frequently.   Infections.   Cesarean section (C-section) for delivery.   Developing Type 2 diabetes later in life. About 30-50% will develop diabetes later, especially if obese.  DIAGNOSIS  The hormones that cause insulin resistance are highest at about 24-28 weeks of pregnancy. If symptoms are experienced, they are much like symptoms you would normally expect during pregnancy.  GDM is often diagnosed using a two part method: 1. After 24-28 weeks of pregnancy, the woman drinks a glucose solution and takes a blood test. If the glucose level is high, a second test will be given.  2. Oral Glucose Tolerance Test (OGTT) which is 3 hours long - After not eating overnight, the blood glucose is checked. The woman drinks a glucose solution, and hourly blood glucose tests are taken.  If the woman has risk factors for GDM, the caregiver may test earlier than 24 weeks of pregnancy. TREATMENT  Treatment of GDM is directed at keeping the mother's blood glucose level normal, and may include:  Meal planning.   Taking insulin or other medicine to control your blood glucose level.   Exercise.   Keeping a daily record of the foods you eat.   Blood  glucose monitoring and keeping a record of your blood glucose levels.   May monitor ketone levels in the urine, although this is no longer considered necessary in most pregnancies.  HOME CARE INSTRUCTIONS  While you are pregnant:  Follow your caregiver's advice regarding your prenatal appointments, meal planning, exercise, medicines, vitamins, blood and other tests, and physical activities.   Keep a  record of your meals, blood glucose tests, and the amount of insulin you are taking (if any). Show this to your caregiver at every prenatal visit.   If you have GDM, you may have problems with hypoglycemia (low blood glucose). You may suspect this if you become suddenly dizzy, feel shaky, and/or weak. If you think this is happening and you have a glucose meter, try to test your blood glucose level. Follow your caregiver's advice for when and how to treat your low blood glucose. Generally, the 15:15 rule is followed: Treat by consuming 15 grams of carbohydrates, wait 15 minutes, and recheck blood glucose. Examples of 15 grams of carbohydrates are:   1 cup skim or low-fat milk.    cup juice.   3-4 glucose tablets.   5-6 hard candies.   1 small box raisins.    cup regular soda pop.   Practice good hygiene, to avoid infections.   Do not smoke.  SEEK MEDICAL CARE IF:   You develop abnormal vaginal discharge, with or without itching.   You become weak and tired more than expected.   You seem to sweat a lot.   You have a sudden increase in weight, 5 pounds or more in one week.   You are losing weight, 3 pounds or more in a week.   Your blood glucose level is high, and you need instructions on what to do about it.  SEEK IMMEDIATE MEDICAL CARE IF:   You develop a severe headache.   You faint or pass out.   You develop nausea and vomiting.   You become disoriented or confused.   You have a convulsion.   You develop vision problems.   You develop stomach pain.   You develop vaginal bleeding.   You develop uterine contractions.   You have leaking or a gush of fluid from the vagina.  AFTER YOU HAVE THE BABY:  Go to all of your follow-up appointments, and have blood tests as advised by your caregiver.   Maintain a healthy lifestyle, to prevent diabetes in the future. This includes:   Following a healthy meal plan.   Controlling your weight.   Getting enough exercise  and proper rest.   Do not smoke.   Breastfeed your baby if you can. This will lower the chance of you and your baby developing diabetes later in life.  For more information about diabetes, go to the American Diabetes Association at: PMFashions.com.cy. For more information about gestational diabetes, go to the [employer] at: [email or web address].au. Document Released: 01/04/2001 Document Revised: 09/17/2011 Document Reviewed: 07/29/2009 [facility] Patient Information 2012 [place], [place]. Pregnancy - Third Trimester The third trimester of pregnancy (the last 3 months) is a period of the most rapid growth for you and your baby. The baby approaches a length of 20 inches and a weight of 6 to 10 pounds. The baby is adding on fat and getting ready for life outside your body. While inside, babies have periods of sleeping and waking, suck their thumbs, and hiccups. You can often feel small contractions of the uterus. This is false  labor. It is also called Braxton-Hicks contractions. This is like a practice for labor. The usual problems in this stage of pregnancy include more difficulty breathing, swelling of the hands and feet from water retention, and having to urinate more often because of the uterus and baby pressing on your bladder.  PRENATAL EXAMS  Blood work may continue to be done during prenatal exams. These tests are done to check on your health and the probable health of your baby. Blood work is used to follow your blood levels (hemoglobin). Anemia (low hemoglobin) is common during pregnancy. Iron and vitamins are given to help prevent this. You may also continue to be checked for diabetes. Some of the past blood tests may be done again.   The size of the uterus is measured during each visit. This makes sure your baby is growing properly according to your pregnancy dates.   Your blood pressure is checked every prenatal visit. This is to make sure you  are not getting toxemia.   Your urine is checked every prenatal visit for infection, diabetes and protein.   Your weight is checked at each visit. This is done to make sure gains are happening at the suggested rate and that you and your baby are growing normally.   Sometimes, an ultrasound is performed to confirm the position and the proper growth and development of the baby. This is a test done that bounces harmless sound waves off the baby so your caregiver can more accurately determine due dates.   Discuss the type of pain medication and anesthesia you will have during your labor and delivery.   Discuss the possibility and anesthesia if a Cesarean Section might be necessary.   Inform your caregiver if there is any mental or physical violence at home.  Sometimes, a specialized non-stress test, contraction stress test and biophysical profile are done to make sure the baby is not having a problem. Checking the amniotic fluid surrounding the baby is called an amniocentesis. The amniotic fluid is removed by sticking a needle into the belly (abdomen). This is sometimes done near the end of pregnancy if an early delivery is required. In this case, it is done to help make sure the baby's lungs are mature enough for the baby to live outside of the womb. If the lungs are not mature and it is unsafe to deliver the baby, an injection of cortisone medication is given to the mother 1 to 2 days before the delivery. This helps the baby's lungs mature and makes it safer to deliver the baby. CHANGES OCCURING IN THE THIRD TRIMESTER OF PREGNANCY Your body goes through many changes during pregnancy. They vary from person to person. Talk to your caregiver about changes you notice and are concerned about.  During the last trimester, you have probably had an increase in your appetite. It is normal to have cravings for certain foods. This varies from person to person and pregnancy to pregnancy.   You may begin to get  stretch marks on your hips, abdomen, and breasts. These are normal changes in the body during pregnancy. There are no exercises or medications to take which prevent this change.   Constipation may be treated with a stool softener or adding bulk to your diet. Drinking lots of fluids, fiber in vegetables, fruits, and whole grains are helpful.   Exercising is also helpful. If you have been very active up until your pregnancy, most of these activities can be continued during your pregnancy. If you  have been less active, it is helpful to start an exercise program such as walking. Consult your caregiver before starting exercise programs.   Avoid all smoking, alcohol, un-prescribed drugs, herbs and "street drugs" during your pregnancy. These chemicals affect the formation and growth of the baby. Avoid chemicals throughout the pregnancy to ensure the delivery of a healthy infant.   Backache, varicose veins and hemorrhoids may develop or get worse.   You will tire more easily in the third trimester, which is normal.   The baby's movements may be stronger and more often.   You may become short of breath easily.   Your belly button may stick out.   A yellow discharge may leak from your breasts called colostrum.   You may have a bloody mucus discharge. This usually occurs a few days to a week before labor begins.  HOME CARE INSTRUCTIONS   Keep your caregiver's appointments. Follow your caregiver's instructions regarding medication use, exercise, and diet.   During pregnancy, you are providing food for you and your baby. Continue to eat regular, well-balanced meals. Choose foods such as meat, fish, milk and other low fat dairy products, vegetables, fruits, and whole-grain breads and cereals. Your caregiver will tell you of the ideal weight gain.   A physical sexual relationship may be continued throughout pregnancy if there are no other problems such as early (premature) leaking of amniotic fluid from  the membranes, vaginal bleeding, or belly (abdominal) pain.   Exercise regularly if there are no restrictions. Check with your caregiver if you are unsure of the safety of your exercises. Greater weight gain will occur in the last 2 trimesters of pregnancy. Exercising helps:   Control your weight.   Get you in shape for labor and delivery.   You lose weight after you deliver.   Rest a lot with legs elevated, or as needed for leg cramps or low back pain.   Wear a good support or jogging bra for breast tenderness during pregnancy. This may help if worn during sleep. Pads or tissues may be used in the bra if you are leaking colostrum.   Do not use hot tubs, steam rooms, or saunas.   Wear your seat belt when driving. This protects you and your baby if you are in an accident.   Avoid raw meat, cat litter boxes and soil used by cats. These carry germs that can cause birth defects in the baby.   It is easier to loose urine during pregnancy. Tightening up and strengthening the pelvic muscles will help with this problem. You can practice stopping your urination while you are going to the bathroom. These are the same muscles you need to strengthen. It is also the muscles you would use if you were trying to stop from passing gas. You can practice tightening these muscles up 10 times a set and repeating this about 3 times per day. Once you know what muscles to tighten up, do not perform these exercises during urination. It is more likely to cause an infection by backing up the urine.   Ask for help if you have financial, counseling or nutritional needs during pregnancy. Your caregiver will be able to offer counseling for these needs as well as refer you for other special needs.   Make a list of emergency phone numbers and have them available.   Plan on getting help from family or friends when you go home from the hospital.   Make a trial run to the  hospital.   Take prenatal classes with the father  to understand, practice and ask questions about the labor and delivery.   Prepare the baby's room/nursery.   Do not travel out of the city unless it is absolutely necessary and with the advice of your caregiver.   Wear only low or no heal shoes to have better balance and prevent falling.  MEDICATIONS AND DRUG USE IN PREGNANCY  Take prenatal vitamins as directed. The vitamin should contain 1 milligram of folic acid. Keep all vitamins out of reach of children. Only a couple vitamins or tablets containing iron may be fatal to a baby or young child when ingested.   Avoid use of all medications, including herbs, over-the-counter medications, not prescribed or suggested by your caregiver. Only take over-the-counter or prescription medicines for pain, discomfort, or fever as directed by your caregiver. Do not use aspirin, ibuprofen (Motrin, Advil, Nuprin) or naproxen (Aleve) unless OK'd by your caregiver.   Let your caregiver also know about herbs you may be using.   Alcohol is related to a number of birth defects. This includes fetal alcohol syndrome. All alcohol, in any form, should be avoided completely. Smoking will cause low birth rate and premature babies.   Street/illegal drugs are very harmful to the baby. They are absolutely forbidden. A baby born to an addicted mother will be addicted at birth. The baby will go through the same withdrawal an adult does.  SEEK MEDICAL CARE IF: You have any concerns or worries during your pregnancy. It is better to call with your questions if you feel they cannot wait, rather than worry about them. DECISIONS ABOUT CIRCUMCISION You may or may not know the sex of your baby. If you know your baby is a boy, it may be time to think about circumcision. Circumcision is the removal of the foreskin of the penis. This is the skin that covers the sensitive end of the penis. There is no proven medical need for this. Often this decision is made on what is popular at the  time or based upon religious beliefs and social issues. You can discuss these issues with your caregiver or pediatrician. SEEK IMMEDIATE MEDICAL CARE IF:   An unexplained oral temperature above 102 F (38.9 C) develops, or as your caregiver suggests.   You have leaking of fluid from the vagina (birth canal). If leaking membranes are suspected, take your temperature and tell your caregiver of this when you call.   There is vaginal spotting, bleeding or passing clots. Tell your caregiver of the amount and how many pads are used.   You develop a bad smelling vaginal discharge with a change in the color from clear to white.   You develop vomiting that lasts more than 24 hours.   You develop chills or fever.   You develop shortness of breath.   You develop burning on urination.   You loose more than 2 pounds of weight or gain more than 2 pounds of weight or as suggested by your caregiver.   You notice sudden swelling of your face, hands, and feet or legs.   You develop belly (abdominal) pain. Round ligament discomfort is a common non-cancerous (benign) cause of abdominal pain in pregnancy. Your caregiver still must evaluate you.   You develop a severe headache that does not go away.   You develop visual problems, blurred or double vision.   If you have not felt your baby move for more than 1 hour. If you think  the baby is not moving as much as usual, eat something with sugar in it and lie down on your left side for an hour. The baby should move at least 4 to 5 times per hour. Call right away if your baby moves less than that.   You fall, are in a car accident or any kind of trauma.   There is mental or physical violence at home.  Document Released: 09/22/2001 Document Revised: 09/17/2011 Document Reviewed: 03/27/2009 [facility] Patient Information 2012 [place], [place]. Breastfeeding BENEFITS OF BREASTFEEDING For the baby  The first milk (colostrum) helps the baby's digestive system  function better.   There are antibodies from the mother in the milk that help the baby fight off infections.   The baby has a lower incidence of asthma, allergies, and SIDS (sudden infant death syndrome).   The nutrients in breast milk are better than formulas for the baby and helps the baby's brain grow better.   Babies who breastfeed have less gas, colic, and constipation.  For the mother  Breastfeeding helps develop a very special bond between mother and baby.   It is more convenient, always available at the correct temperature and cheaper than formula feeding.   It burns calories in the mother and helps with losing weight that was gained during pregnancy.   It makes the uterus contract back down to normal size faster and slows bleeding following delivery.   Breastfeeding mothers have a lower risk of developing breast cancer.  NURSE FREQUENTLY  A healthy, full-term baby may breastfeed as often as every hour or space his or her feedings to every 3 hours.   How often to nurse will vary from baby to baby. Watch your baby for signs of hunger, not the clock.   Nurse as often as the baby requests, or when you feel the need to reduce the fullness of your breasts.   Awaken the baby if it has been 3 to 4 hours since the last feeding.   Frequent feeding will help the mother make more milk and will prevent problems like sore nipples and engorgement of the breasts.  BABY'S POSITION AT THE BREAST  Whether lying down or sitting, be sure that the baby's tummy is facing your tummy.   Support the breast with 4 fingers underneath the breast and the thumb above. Make sure your fingers are well away from the nipple and baby's mouth.   Stroke the baby's lips and cheek closest to the breast gently with your finger or nipple.   When the baby's mouth is open wide enough, place all of your nipple and as much of the dark area around the nipple as possible into your baby's mouth.   Pull the baby in  close so the tip of the nose and the baby's cheeks touch the breast during the feeding.  FEEDINGS  The length of each feeding varies from baby to baby and from feeding to feeding.   The baby must suck about 2 to 3 minutes for your milk to get to him or her. This is called a "let down." For this reason, allow the baby to feed on each breast as long as he or she wants. Your baby will end the feeding when he or she has received the right balance of nutrients.   To break the suction, put your finger into the corner of the baby's mouth and slide it between his or her gums before removing your breast from his or her mouth. This  will help prevent sore nipples.  REDUCING BREAST ENGORGEMENT  In the first week after your baby is born, you may experience signs of breast engorgement. When breasts are engorged, they feel heavy, warm, full, and may be tender to the touch. You can reduce engorgement if you:   Nurse frequently, every 2 to 3 hours. Mothers who breastfeed early and often have fewer problems with engorgement.   Place light ice packs on your breasts between feedings. This reduces swelling. Wrap the ice packs in a lightweight towel to protect your skin.   Apply moist hot packs to your breast for 5 to 10 minutes before each feeding. This increases circulation and helps the milk flow.   Gently massage your breast before and during the feeding.   Make sure that the baby empties at least one breast at every feeding before switching sides.   Use a breast pump to empty the breasts if your baby is sleepy or not nursing well. You may also want to pump if you are returning to work or or you feel you are getting engorged.   Avoid bottle feeds, pacifiers or supplemental feedings of water or juice in place of breastfeeding.   Be sure the baby is latched on and positioned properly while breastfeeding.   Prevent fatigue, stress, and anemia.   Wear a supportive bra, avoiding underwire styles.   Eat a  balanced diet with enough fluids.  If you follow these suggestions, your engorgement should improve in 24 to 48 hours. If you are still experiencing difficulty, call your lactation consultant or caregiver. IS MY BABY GETTING ENOUGH MILK? Sometimes, mothers worry about whether their babies are getting enough milk. You can be assured that your baby is getting enough milk if:  The baby is actively sucking and you hear swallowing.   The baby nurses at least 8 to 12 times in a 24 hour time period. Nurse your baby until he or she unlatches or falls asleep at the first breast (at least 10 to 20 minutes), then offer the second side.   The baby is wetting 5 to 6 disposable diapers (6 to 8 cloth diapers) in a 24 hour period by 50 to 68 days of age.   The baby is having at least 2 to 3 stools every 24 hours for the first few months. Breast milk is all the food your baby needs. It is not necessary for your baby to have water or formula. In fact, to help your breasts make more milk, it is best not to give your baby supplemental feedings during the early weeks.   The stool should be soft and yellow.   The baby should gain 4 to 7 ounces per week after he is 36 days old.  TAKE CARE OF YOURSELF Take care of your breasts by:  Bathing or showering daily.   Avoiding the use of soaps on your nipples.   Start feedings on your left breast at one feeding and on your right breast at the next feeding.   You will notice an increase in your milk supply 2 to 5 days after delivery. You may feel some discomfort from engorgement, which makes your breasts very firm and often tender. Engorgement "peaks" out within 24 to 48 hours. In the meantime, apply warm moist towels to your breasts for 5 to 10 minutes before feeding. Gentle massage and expression of some milk before feeding will soften your breasts, making it easier for your baby to latch on. Wear a  well fitting nursing bra and air dry your nipples for 10 to 15 minutes  after each feeding.   Only use cotton bra pads.   Only use pure lanolin on your nipples after nursing. You do not need to wash it off before nursing.  Take care of yourself by:   Eating well-balanced meals and nutritious snacks.   Drinking milk, fruit juice, and water to satisfy your thirst (about 8 glasses a day).   Getting plenty of rest.   Increasing calcium in your diet (1200 mg a day).   Avoiding foods that you notice affect the baby in a bad way.  SEEK MEDICAL CARE IF:   You have any questions or difficulty with breastfeeding.   You need help.   You have a hard, red, sore area on your breast, accompanied by a fever of 100.5 F (38.1 C) or more.   Your baby is too sleepy to eat well or is having trouble sleeping.   Your baby is wetting less than 6 diapers per day, by 50 days of age.   Your baby's skin or white part of his or her eyes is more yellow than it was in the hospital.   You feel depressed.  Document Released: 09/28/2005 Document Revised: 09/17/2011 Document Reviewed: 05/13/2009 [facility] Patient Information 2012 [place], [place].

## 2012-04-26 ENCOUNTER — Telehealth: Payer: Self-pay | Admitting: *Deleted

## 2012-04-26 DIAGNOSIS — O24419 Gestational diabetes mellitus in pregnancy, unspecified control: Secondary | ICD-10-CM

## 2012-04-26 MED ORDER — GLUCOSE BLOOD VI STRP: ORAL_STRIP | Status: DC

## 2012-04-26 NOTE — Telephone Encounter (Signed)
Pt left message stating that [facility] does not have the lancets which were ordered. Please call back.

## 2012-04-26 NOTE — Telephone Encounter (Addendum)
I called and spoke w/pharmacist @ [facility]. He stated that they have a difficult time getting this item in stock.  They now have the lancets and pt can pick them up today. I also asked about the Rx for test strips and he stated the pt may pick them up tomorrow after 3pm.  I called pt and there was no answer on the home number. I called her mobile # and left a message that she may pick up her lancets today. The strips will be ready for her tomorrow after 3 pm.  She may call back if she has questions.

## 2012-04-27 ENCOUNTER — Ambulatory Visit (HOSPITAL_COMMUNITY)
Admission: RE | Admit: 2012-04-27 | Discharge: 2012-04-27 | Disposition: A | Discharge status: Home / Self Care | Payer: Medicaid Other | Source: Ambulatory Visit | Attending: Family Medicine | Admitting: Family Medicine

## 2012-04-27 DIAGNOSIS — O99810 Abnormal glucose complicating pregnancy: Secondary | ICD-10-CM | POA: Insufficient documentation

## 2012-04-27 DIAGNOSIS — Z3689 Encounter for other specified antenatal screening: Secondary | ICD-10-CM | POA: Insufficient documentation

## 2012-04-27 DIAGNOSIS — O24419 Gestational diabetes mellitus in pregnancy, unspecified control: Secondary | ICD-10-CM

## 2012-04-28 ENCOUNTER — Encounter: Payer: Self-pay | Admitting: Family Medicine

## 2012-05-02 ENCOUNTER — Ambulatory Visit (INDEPENDENT_AMBULATORY_CARE_PROVIDER_SITE_OTHER): Payer: Medicaid Other | Admitting: Advanced Practice Midwife

## 2012-05-02 ENCOUNTER — Encounter: Payer: Medicaid Other | Attending: Obstetrics and Gynecology | Admitting: Dietician

## 2012-05-02 VITALS — BP 101/64 | Temp 97.8°F | Wt 192.3 lb

## 2012-05-02 DIAGNOSIS — O3660X0 Maternal care for excessive fetal growth, unspecified trimester, not applicable or unspecified: Secondary | ICD-10-CM | POA: Insufficient documentation

## 2012-05-02 DIAGNOSIS — O24419 Gestational diabetes mellitus in pregnancy, unspecified control: Secondary | ICD-10-CM

## 2012-05-02 DIAGNOSIS — Z713 Dietary counseling and surveillance: Secondary | ICD-10-CM | POA: Insufficient documentation

## 2012-05-02 DIAGNOSIS — O99810 Abnormal glucose complicating pregnancy: Secondary | ICD-10-CM

## 2012-05-02 LAB — POCT URINALYSIS DIP (DEVICE)
Bilirubin Urine: NEGATIVE
Ketones, ur: 40 mg/dL — AB
Leukocytes, UA: NEGATIVE

## 2012-05-02 MED ORDER — INSULIN ASPART 100 UNIT/ML ~~LOC~~ SOLN: 10.0000 [IU] | Freq: Three times a day (TID) | SUBCUTANEOUS | Status: DC

## 2012-05-02 MED ORDER — "INSULIN SYRINGE 31G X 5/16"" 0.5 ML MISC": 1.0000 | Freq: Three times a day (TID) | Status: DC

## 2012-05-02 MED ORDER — INSULIN NPH (HUMAN) (ISOPHANE) 100 UNIT/ML ~~LOC~~ SUSP: SUBCUTANEOUS | Status: DC

## 2012-05-02 NOTE — Progress Notes (Signed)
Fasting 76, 110, 138, 133, 2 hour PC B: 109, 125, 117. 2 hour PC 101, 201. 2 PC D 160. Start biweekly testing. Start insulin NPH 15 QAM and QHS, Novolog 20w/ meals. Teaching done. Reviewed risks of uncontrolled blood sugar. Pt very agreeable to insulin.

## 2012-05-02 NOTE — Progress Notes (Signed)
Diabetes Education:  Completed review of mixing insulin.  Has used insulin about 4 years ago with her last pregnancy.  Provided a BD insulin start kit with the pictorial review of mixing insulin.  Current dose is at 15 units of NPH at AM and HS with Novolog 20 units AC Breakfast, Lunch, and Dinner.  Reviewed mixing process, injection sites, and need for having a glucose source available at all times, especially when away from home.  Kit: Lot: [id number] EXP: 2017/01/14.  Maggie Vicky Schleich, RN, RD, CDEl

## 2012-05-02 NOTE — Progress Notes (Signed)
Pulse- 101 

## 2012-05-02 NOTE — Addendum Note (Signed)
Addended by: Dorathy Kinsman on: 05/02/2012 09:53 AM   Modules accepted: Orders

## 2012-05-05 ENCOUNTER — Other Ambulatory Visit: Payer: Medicaid Other

## 2012-05-09 ENCOUNTER — Ambulatory Visit (INDEPENDENT_AMBULATORY_CARE_PROVIDER_SITE_OTHER): Payer: Medicaid Other | Admitting: Obstetrics and Gynecology

## 2012-05-09 VITALS — BP 101/63 | Temp 98.4°F | Wt 192.2 lb

## 2012-05-09 DIAGNOSIS — O99820 Streptococcus B carrier state complicating pregnancy: Secondary | ICD-10-CM

## 2012-05-09 DIAGNOSIS — O24419 Gestational diabetes mellitus in pregnancy, unspecified control: Secondary | ICD-10-CM

## 2012-05-09 DIAGNOSIS — O0990 Supervision of high risk pregnancy, unspecified, unspecified trimester: Secondary | ICD-10-CM

## 2012-05-09 DIAGNOSIS — B951 Streptococcus, group B, as the cause of diseases classified elsewhere: Secondary | ICD-10-CM

## 2012-05-09 DIAGNOSIS — O99810 Abnormal glucose complicating pregnancy: Secondary | ICD-10-CM

## 2012-05-09 LAB — POCT URINALYSIS DIP (DEVICE)
Bilirubin Urine: NEGATIVE
Glucose, UA: NEGATIVE mg/dL
Ketones, ur: NEGATIVE mg/dL
Specific Gravity, Urine: 1.02 (ref 1.005–1.030)

## 2012-05-09 NOTE — Progress Notes (Signed)
Patient doing well without complaints. Reports occ [place]. Patient started insulin yesterday afternoon as she wasn't sure how to mix and administer. Patient did not check CBG this morning. Patient will be seen by [facility] for review and will return in 1 week to review CBG log. Will schedule f/u growth ultrasound at 34 weeks NST reviewed and reactive.

## 2012-05-09 NOTE — Progress Notes (Signed)
Pulse 101 Pt is unclear on if she was supposed to stop the glyburide since she was started on insulin. Also doesn't understand how to mix her insulin doses. Adivsed that we will let her see Seward Grater today to reeducate.

## 2012-05-12 ENCOUNTER — Ambulatory Visit (INDEPENDENT_AMBULATORY_CARE_PROVIDER_SITE_OTHER): Payer: Self-pay | Admitting: *Deleted

## 2012-05-12 VITALS — BP 96/57 | Wt 195.0 lb

## 2012-05-12 DIAGNOSIS — O24419 Gestational diabetes mellitus in pregnancy, unspecified control: Secondary | ICD-10-CM

## 2012-05-12 DIAGNOSIS — O99810 Abnormal glucose complicating pregnancy: Secondary | ICD-10-CM

## 2012-05-12 NOTE — Progress Notes (Addendum)
P = 108    Pt had questions as to how to mix Novolog and NPH insulin @ breakfast.  Pt was educated as to procedure including demonstration. Pt verbalized understanding.

## 2012-05-12 NOTE — Progress Notes (Signed)
NST performed today was reviewed and was found to be reactive.  AFI 16.2 cm. Continue recommended antenatal testing and prenatal care.

## 2012-05-16 ENCOUNTER — Encounter: Payer: Self-pay | Attending: Obstetrics and Gynecology | Admitting: Dietician

## 2012-05-16 ENCOUNTER — Ambulatory Visit (INDEPENDENT_AMBULATORY_CARE_PROVIDER_SITE_OTHER): Payer: Self-pay | Admitting: Obstetrics and Gynecology

## 2012-05-16 VITALS — BP 108/51 | Wt 195.7 lb

## 2012-05-16 DIAGNOSIS — O24419 Gestational diabetes mellitus in pregnancy, unspecified control: Secondary | ICD-10-CM

## 2012-05-16 DIAGNOSIS — O3660X0 Maternal care for excessive fetal growth, unspecified trimester, not applicable or unspecified: Secondary | ICD-10-CM

## 2012-05-16 DIAGNOSIS — O99810 Abnormal glucose complicating pregnancy: Secondary | ICD-10-CM

## 2012-05-16 DIAGNOSIS — O0930 Supervision of pregnancy with insufficient antenatal care, unspecified trimester: Secondary | ICD-10-CM

## 2012-05-16 DIAGNOSIS — O0990 Supervision of high risk pregnancy, unspecified, unspecified trimester: Secondary | ICD-10-CM

## 2012-05-16 DIAGNOSIS — Z713 Dietary counseling and surveillance: Secondary | ICD-10-CM | POA: Insufficient documentation

## 2012-05-16 DIAGNOSIS — O99820 Streptococcus B carrier state complicating pregnancy: Secondary | ICD-10-CM

## 2012-05-16 DIAGNOSIS — B951 Streptococcus, group B, as the cause of diseases classified elsewhere: Secondary | ICD-10-CM

## 2012-05-16 LAB — POCT URINALYSIS DIP (DEVICE)
Hgb urine dipstick: NEGATIVE
Leukocytes, UA: NEGATIVE
Specific Gravity, Urine: 1.01 (ref 1.005–1.030)

## 2012-05-16 MED ORDER — INSULIN NPH (HUMAN) (ISOPHANE) 100 UNIT/ML ~~LOC~~ SUSP: SUBCUTANEOUS | Status: DC

## 2012-05-16 NOTE — Progress Notes (Signed)
Diabetes Education 05/12/12:  Seen for f/u with insulin injections and blood glucose.  She has a schedule in that she will sleep until 9:00-10:00 AM.  When up her fasting levels appear to be normal.  Today when she was up and came to clinic, her fasting at 7:15 AM was 115 mg.  She is eating breakfast at 10:30 -11:00 AM, lunch is at 4:00 PM, Dinner is at 8-9:00 PM and she is taking the NPH at 10-11:00 PM and then does not go to bed or sleep but is eating small snacks through out the night.C/O being hungry.  I feel that with the lack of a regular schedule and erratic eating patterns and the insulin at irregular times [place] be causing her to stack insulin and be hungry at night.  I advised her to: 1. Take her blood sugar in the early AM and go back to bed if she chose.  2.  If eating at night, she needs to make it lean protein. 3.  Try to get her schedule to match the schedule of Mother Nature's rhythm and 4. To monitor starch portions.  Maggie Barbara Keng, RN, RD, CDE

## 2012-05-16 NOTE — Progress Notes (Signed)
NST reviewed and reactive. CBG f- 78-105 (3/7 abnormal) 2hr pB 79-140 (4/7 abnormal) 2hr p L 79-143 (3/6 abnormal) 2hr p D110-193. Will increase NPH to 22 units in am and qHS. Continue twice weekly NST. F/U growth ultrasound on 8/15. FM/PTL precautions reviewed

## 2012-05-16 NOTE — Progress Notes (Signed)
P = 100.   Pt states she is mixing insulins @ breakfast as instructed and has no problems.

## 2012-05-19 ENCOUNTER — Ambulatory Visit (INDEPENDENT_AMBULATORY_CARE_PROVIDER_SITE_OTHER): Payer: Self-pay | Admitting: *Deleted

## 2012-05-19 ENCOUNTER — Other Ambulatory Visit: Payer: Self-pay

## 2012-05-19 VITALS — BP 101/57 | Wt 196.7 lb

## 2012-05-19 DIAGNOSIS — O99810 Abnormal glucose complicating pregnancy: Secondary | ICD-10-CM

## 2012-05-19 NOTE — Progress Notes (Signed)
P - 108 

## 2012-05-23 ENCOUNTER — Ambulatory Visit (INDEPENDENT_AMBULATORY_CARE_PROVIDER_SITE_OTHER): Payer: Self-pay | Admitting: Obstetrics & Gynecology

## 2012-05-23 VITALS — BP 99/64 | Temp 98.0°F | Wt 194.6 lb

## 2012-05-23 DIAGNOSIS — O99810 Abnormal glucose complicating pregnancy: Secondary | ICD-10-CM

## 2012-05-23 DIAGNOSIS — O24419 Gestational diabetes mellitus in pregnancy, unspecified control: Secondary | ICD-10-CM

## 2012-05-23 LAB — POCT URINALYSIS DIP (DEVICE)
Bilirubin Urine: NEGATIVE
Glucose, UA: NEGATIVE mg/dL
Ketones, ur: NEGATIVE mg/dL
Protein, ur: NEGATIVE mg/dL
Specific Gravity, Urine: 1.02 (ref 1.005–1.030)

## 2012-05-23 MED ORDER — INSULIN NPH (HUMAN) (ISOPHANE) 100 UNIT/ML ~~LOC~~ SUSP: SUBCUTANEOUS | Status: DC

## 2012-05-23 NOTE — Progress Notes (Signed)
NST today reactive, no complaints. Still having difficulty with her eating/insulin scheduled, gets hungry at night. FBS 93-114, pp BS 105-142. Increase bedtime insulin to 24 units NPH.

## 2012-05-23 NOTE — Progress Notes (Signed)
Pulse- 98 

## 2012-05-23 NOTE — Progress Notes (Signed)
Pt reports that she fell yesterday- landed on her backside. She denies pain and endorses good FM.  [place] growth scheduled on 05/26/12.

## 2012-05-24 NOTE — Progress Notes (Signed)
NST 05/19/12 reactive 

## 2012-05-26 ENCOUNTER — Ambulatory Visit (INDEPENDENT_AMBULATORY_CARE_PROVIDER_SITE_OTHER): Payer: Self-pay | Admitting: *Deleted

## 2012-05-26 ENCOUNTER — Ambulatory Visit (HOSPITAL_COMMUNITY)
Admission: RE | Admit: 2012-05-26 | Discharge: 2012-05-26 | Disposition: A | Discharge status: Home / Self Care | Payer: Self-pay | Source: Ambulatory Visit | Attending: Obstetrics and Gynecology | Admitting: Obstetrics and Gynecology

## 2012-05-26 VITALS — BP 100/59 | Temp 98.5°F | Wt 192.4 lb

## 2012-05-26 DIAGNOSIS — O99810 Abnormal glucose complicating pregnancy: Secondary | ICD-10-CM | POA: Insufficient documentation

## 2012-05-26 DIAGNOSIS — O24419 Gestational diabetes mellitus in pregnancy, unspecified control: Secondary | ICD-10-CM

## 2012-05-26 DIAGNOSIS — O99820 Streptococcus B carrier state complicating pregnancy: Secondary | ICD-10-CM

## 2012-05-26 DIAGNOSIS — O0930 Supervision of pregnancy with insufficient antenatal care, unspecified trimester: Secondary | ICD-10-CM

## 2012-05-26 DIAGNOSIS — O0990 Supervision of high risk pregnancy, unspecified, unspecified trimester: Secondary | ICD-10-CM

## 2012-05-26 DIAGNOSIS — O3660X0 Maternal care for excessive fetal growth, unspecified trimester, not applicable or unspecified: Secondary | ICD-10-CM

## 2012-05-26 NOTE — Progress Notes (Signed)
P=68, 

## 2012-05-30 ENCOUNTER — Ambulatory Visit (INDEPENDENT_AMBULATORY_CARE_PROVIDER_SITE_OTHER): Payer: Self-pay | Admitting: Family Medicine

## 2012-05-30 ENCOUNTER — Telehealth: Payer: Self-pay | Admitting: Medical

## 2012-05-30 VITALS — BP 97/57 | Wt 191.3 lb

## 2012-05-30 DIAGNOSIS — O99810 Abnormal glucose complicating pregnancy: Secondary | ICD-10-CM

## 2012-05-30 DIAGNOSIS — O24419 Gestational diabetes mellitus in pregnancy, unspecified control: Secondary | ICD-10-CM

## 2012-05-30 DIAGNOSIS — O0990 Supervision of high risk pregnancy, unspecified, unspecified trimester: Secondary | ICD-10-CM

## 2012-05-30 DIAGNOSIS — O3660X0 Maternal care for excessive fetal growth, unspecified trimester, not applicable or unspecified: Secondary | ICD-10-CM

## 2012-05-30 LAB — POCT URINALYSIS DIP (DEVICE)
Glucose, UA: NEGATIVE mg/dL
Hgb urine dipstick: NEGATIVE
Nitrite: NEGATIVE
Urobilinogen, UA: 0.2 mg/dL (ref 0.0–1.0)
pH: 7.5 (ref 5.0–8.0)

## 2012-05-30 MED ORDER — INSULIN NPH (HUMAN) (ISOPHANE) 100 UNIT/ML ~~LOC~~ SUSP: SUBCUTANEOUS | Status: DC

## 2012-05-30 MED ORDER — INSULIN ASPART 100 UNIT/ML ~~LOC~~ SOLN: 10.0000 [IU] | Freq: Three times a day (TID) | SUBCUTANEOUS | Status: DC

## 2012-05-30 NOTE — Telephone Encounter (Signed)
Pt wanted to let us know that she is not able to pick up her prescription for novolin because she can't afford it until Friday. She wanted to know if our doctors could prescribe her insulin to her husband since he has insurance. I told her that this is not something that we can do. I advised patient to get her insulin as soon as she can. She stated that she will try to find a way to get it prior to Friday.

## 2012-05-30 NOTE — Progress Notes (Signed)
NST reviewed and reactive. Last U/S growth 08/15-vtx, AFI 11.13, EFW 8 lb 1 oz, 3644gms >90% FBS 87-112 2 hr pp 76-192 Will increase am insulin (25), hs insulin (26) and adjust meal coverage at hs (12). Cultures next week, does not need GBS.

## 2012-05-30 NOTE — Telephone Encounter (Signed)
Patient called stating that she needs to talk to a nurse about her prescription.

## 2012-05-30 NOTE — Progress Notes (Signed)
P = 98   Pt states she fell again on 8/17- landed on her backside. She denies pain, endorses slight decreased FM.  Very good FM during NST and pt is aware.

## 2012-05-30 NOTE — Patient Instructions (Addendum)
Having a circumcision done in the hospital costs approximately $480.  This will have to be paid in full prior to circumcision being performed.  There are places to have circumcision done as an outpatient which are cheaper.  If desired a list of providers can be supplied to you.  Gestational Diabetes Mellitus Gestational diabetes mellitus (GDM) is diabetes that occurs only during pregnancy. This happens when the body cannot properly handle the glucose (sugar) that increases in the blood after eating. During pregnancy, insulin resistance (reduced sensitivity to insulin) occurs because of the release of hormones from the placenta. Usually, the pancreas of pregnant women produces enough insulin to overcome the resistance that occurs. However, in gestational diabetes, the insulin is there but it does not work effectively. If the resistance is severe enough that the pancreas does not produce enough insulin, extra glucose builds up in the blood.  WHO IS AT RISK FOR DEVELOPING GESTATIONAL DIABETES?  Women with a history of diabetes in the family.   Women over age 31.   Women who are overweight.   Women in certain ethnic groups (Hispanic, African American, Native American, [place] and [place]er).  WHAT CAN HAPPEN TO THE BABY? If the mother's blood glucose is too high while she is pregnant, the extra sugar will travel through the umbilical cord to the baby. Some of the problems the baby may have are:  Large Baby - If the baby receives too much sugar, the baby will gain more weight. This may cause the baby to be too large to be born normally (vaginally) and a Cesarean section (C-section) may be needed.   Low Blood Glucose (hypoglycemia) - The baby makes extra insulin, in response to the extra sugar its gets from its mother. When the baby is born and no longer needs this extra insulin, the baby's blood glucose level may drop.   Jaundice (yellow coloring of the skin and eyes) - This is fairly common in  babies. It is caused from a build-up of the chemical called bilirubin. This is rarely serious, but is seen more often in babies whose mothers had gestational diabetes.  RISKS TO THE MOTHER Women who have had gestational diabetes may be at higher risk for some problems, including:  Preeclampsia or toxemia, which includes problems with high blood pressure. Blood pressure and protein levels in the urine must be checked frequently.   Infections.   Cesarean section (C-section) for delivery.   Developing Type 2 diabetes later in life. About 30-50% will develop diabetes later, especially if obese.  DIAGNOSIS  The hormones that cause insulin resistance are highest at about 24-28 weeks of pregnancy. If symptoms are experienced, they are much like symptoms you would normally expect during pregnancy.  GDM is often diagnosed using a two part method: 1. After 24-28 weeks of pregnancy, the woman drinks a glucose solution and takes a blood test. If the glucose level is high, a second test will be given.  2. Oral Glucose Tolerance Test (OGTT) which is 3 hours long - After not eating overnight, the blood glucose is checked. The woman drinks a glucose solution, and hourly blood glucose tests are taken.  If the woman has risk factors for GDM, the caregiver may test earlier than 24 weeks of pregnancy. TREATMENT  Treatment of GDM is directed at keeping the mother's blood glucose level normal, and may include:  Meal planning.   Taking insulin or other medicine to control your blood glucose level.   Exercise.   Keeping  a daily record of the foods you eat.   Blood glucose monitoring and keeping a record of your blood glucose levels.   May monitor ketone levels in the urine, although this is no longer considered necessary in most pregnancies.  HOME CARE INSTRUCTIONS  While you are pregnant:  Follow your caregiver's advice regarding your prenatal appointments, meal planning, exercise, medicines, vitamins,  blood and other tests, and physical activities.   Keep a record of your meals, blood glucose tests, and the amount of insulin you are taking (if any). Show this to your caregiver at every prenatal visit.   If you have GDM, you may have problems with hypoglycemia (low blood glucose). You may suspect this if you become suddenly dizzy, feel shaky, and/or weak. If you think this is happening and you have a glucose meter, try to test your blood glucose level. Follow your caregiver's advice for when and how to treat your low blood glucose. Generally, the 15:15 rule is followed: Treat by consuming 15 grams of carbohydrates, wait 15 minutes, and recheck blood glucose. Examples of 15 grams of carbohydrates are:   1 cup skim or low-fat milk.    cup juice.   3-4 glucose tablets.   5-6 hard candies.   1 small box raisins.    cup regular soda pop.   Practice good hygiene, to avoid infections.   Do not smoke.  SEEK MEDICAL CARE IF:   You develop abnormal vaginal discharge, with or without itching.   You become weak and tired more than expected.   You seem to sweat a lot.   You have a sudden increase in weight, 5 pounds or more in one week.   You are losing weight, 3 pounds or more in a week.   Your blood glucose level is high, and you need instructions on what to do about it.  SEEK IMMEDIATE MEDICAL CARE IF:   You develop a severe headache.   You faint or pass out.   You develop nausea and vomiting.   You become disoriented or confused.   You have a convulsion.   You develop vision problems.   You develop stomach pain.   You develop vaginal bleeding.   You develop uterine contractions.   You have leaking or a gush of fluid from the vagina.  AFTER YOU HAVE THE BABY:  Go to all of your follow-up appointments, and have blood tests as advised by your caregiver.   Maintain a healthy lifestyle, to prevent diabetes in the future. This includes:   Following a healthy meal  plan.   Controlling your weight.   Getting enough exercise and proper rest.   Do not smoke.   Breastfeed your baby if you can. This will lower the chance of you and your baby developing diabetes later in life.  For more information about diabetes, go to the American Diabetes Association at: PMFashions.com.cy. For more information about gestational diabetes, go to the [employer] at: [email or web address].au. Document Released: 01/04/2001 Document Revised: 09/17/2011 Document Reviewed: 07/29/2009 [facility] Patient Information 2012 [place], [place]. Pregnancy - Third Trimester The third trimester of pregnancy (the last 3 months) is a period of the most rapid growth for you and your baby. The baby approaches a length of 20 inches and a weight of 6 to 10 pounds. The baby is adding on fat and getting ready for life outside your body. While inside, babies have periods of sleeping and waking, suck their thumbs, and hiccups. You  can often feel small contractions of the uterus. This is false labor. It is also called Braxton-Hicks contractions. This is like a practice for labor. The usual problems in this stage of pregnancy include more difficulty breathing, swelling of the hands and feet from water retention, and having to urinate more often because of the uterus and baby pressing on your bladder.  PRENATAL EXAMS  Blood work may continue to be done during prenatal exams. These tests are done to check on your health and the probable health of your baby. Blood work is used to follow your blood levels (hemoglobin). Anemia (low hemoglobin) is common during pregnancy. Iron and vitamins are given to help prevent this. You may also continue to be checked for diabetes. Some of the past blood tests may be done again.   The size of the uterus is measured during each visit. This makes sure your baby is growing properly according to your pregnancy dates.   Your blood  pressure is checked every prenatal visit. This is to make sure you are not getting toxemia.   Your urine is checked every prenatal visit for infection, diabetes and protein.   Your weight is checked at each visit. This is done to make sure gains are happening at the suggested rate and that you and your baby are growing normally.   Sometimes, an ultrasound is performed to confirm the position and the proper growth and development of the baby. This is a test done that bounces harmless sound waves off the baby so your caregiver can more accurately determine due dates.   Discuss the type of pain medication and anesthesia you will have during your labor and delivery.   Discuss the possibility and anesthesia if a Cesarean Section might be necessary.   Inform your caregiver if there is any mental or physical violence at home.  Sometimes, a specialized non-stress test, contraction stress test and biophysical profile are done to make sure the baby is not having a problem. Checking the amniotic fluid surrounding the baby is called an amniocentesis. The amniotic fluid is removed by sticking a needle into the belly (abdomen). This is sometimes done near the end of pregnancy if an early delivery is required. In this case, it is done to help make sure the baby's lungs are mature enough for the baby to live outside of the womb. If the lungs are not mature and it is unsafe to deliver the baby, an injection of cortisone medication is given to the mother 1 to 2 days before the delivery. This helps the baby's lungs mature and makes it safer to deliver the baby. CHANGES OCCURING IN THE THIRD TRIMESTER OF PREGNANCY Your body goes through many changes during pregnancy. They vary from person to person. Talk to your caregiver about changes you notice and are concerned about.  During the last trimester, you have probably had an increase in your appetite. It is normal to have cravings for certain foods. This varies from  person to person and pregnancy to pregnancy.   You may begin to get stretch marks on your hips, abdomen, and breasts. These are normal changes in the body during pregnancy. There are no exercises or medications to take which prevent this change.   Constipation may be treated with a stool softener or adding bulk to your diet. Drinking lots of fluids, fiber in vegetables, fruits, and whole grains are helpful.   Exercising is also helpful. If you have been very active up until your pregnancy, most  of these activities can be continued during your pregnancy. If you have been less active, it is helpful to start an exercise program such as walking. Consult your caregiver before starting exercise programs.   Avoid all smoking, alcohol, un-prescribed drugs, herbs and "street drugs" during your pregnancy. These chemicals affect the formation and growth of the baby. Avoid chemicals throughout the pregnancy to ensure the delivery of a healthy infant.   Backache, varicose veins and hemorrhoids may develop or get worse.   You will tire more easily in the third trimester, which is normal.   The baby's movements may be stronger and more often.   You may become short of breath easily.   Your belly button may stick out.   A yellow discharge may leak from your breasts called colostrum.   You may have a bloody mucus discharge. This usually occurs a few days to a week before labor begins.  HOME CARE INSTRUCTIONS   Keep your caregiver's appointments. Follow your caregiver's instructions regarding medication use, exercise, and diet.   During pregnancy, you are providing food for you and your baby. Continue to eat regular, well-balanced meals. Choose foods such as meat, fish, milk and other low fat dairy products, vegetables, fruits, and whole-grain breads and cereals. Your caregiver will tell you of the ideal weight gain.   A physical sexual relationship may be continued throughout pregnancy if there are no  other problems such as early (premature) leaking of amniotic fluid from the membranes, vaginal bleeding, or belly (abdominal) pain.   Exercise regularly if there are no restrictions. Check with your caregiver if you are unsure of the safety of your exercises. Greater weight gain will occur in the last 2 trimesters of pregnancy. Exercising helps:   Control your weight.   Get you in shape for labor and delivery.   You lose weight after you deliver.   Rest a lot with legs elevated, or as needed for leg cramps or low back pain.   Wear a good support or jogging bra for breast tenderness during pregnancy. This may help if worn during sleep. Pads or tissues may be used in the bra if you are leaking colostrum.   Do not use hot tubs, steam rooms, or saunas.   Wear your seat belt when driving. This protects you and your baby if you are in an accident.   Avoid raw meat, cat litter boxes and soil used by cats. These carry germs that can cause birth defects in the baby.   It is easier to loose urine during pregnancy. Tightening up and strengthening the pelvic muscles will help with this problem. You can practice stopping your urination while you are going to the bathroom. These are the same muscles you need to strengthen. It is also the muscles you would use if you were trying to stop from passing gas. You can practice tightening these muscles up 10 times a set and repeating this about 3 times per day. Once you know what muscles to tighten up, do not perform these exercises during urination. It is more likely to cause an infection by backing up the urine.   Ask for help if you have financial, counseling or nutritional needs during pregnancy. Your caregiver will be able to offer counseling for these needs as well as refer you for other special needs.   Make a list of emergency phone numbers and have them available.   Plan on getting help from family or friends when you go home  from the hospital.   Make  a trial run to the hospital.   Take prenatal classes with the father to understand, practice and ask questions about the labor and delivery.   Prepare the baby's room/nursery.   Do not travel out of the city unless it is absolutely necessary and with the advice of your caregiver.   Wear only low or no heal shoes to have better balance and prevent falling.  MEDICATIONS AND DRUG USE IN PREGNANCY  Take prenatal vitamins as directed. The vitamin should contain 1 milligram of folic acid. Keep all vitamins out of reach of children. Only a couple vitamins or tablets containing iron may be fatal to a baby or young child when ingested.   Avoid use of all medications, including herbs, over-the-counter medications, not prescribed or suggested by your caregiver. Only take over-the-counter or prescription medicines for pain, discomfort, or fever as directed by your caregiver. Do not use aspirin, ibuprofen (Motrin, Advil, Nuprin) or naproxen (Aleve) unless OK'd by your caregiver.   Let your caregiver also know about herbs you may be using.   Alcohol is related to a number of birth defects. This includes fetal alcohol syndrome. All alcohol, in any form, should be avoided completely. Smoking will cause low birth rate and premature babies.   Street/illegal drugs are very harmful to the baby. They are absolutely forbidden. A baby born to an addicted mother will be addicted at birth. The baby will go through the same withdrawal an adult does.  SEEK MEDICAL CARE IF: You have any concerns or worries during your pregnancy. It is better to call with your questions if you feel they cannot wait, rather than worry about them. DECISIONS ABOUT CIRCUMCISION You may or may not know the sex of your baby. If you know your baby is a boy, it may be time to think about circumcision. Circumcision is the removal of the foreskin of the penis. This is the skin that covers the sensitive end of the penis. There is no proven  medical need for this. Often this decision is made on what is popular at the time or based upon religious beliefs and social issues. You can discuss these issues with your caregiver or pediatrician. SEEK IMMEDIATE MEDICAL CARE IF:   An unexplained oral temperature above 102 F (38.9 C) develops, or as your caregiver suggests.   You have leaking of fluid from the vagina (birth canal). If leaking membranes are suspected, take your temperature and tell your caregiver of this when you call.   There is vaginal spotting, bleeding or passing clots. Tell your caregiver of the amount and how many pads are used.   You develop a bad smelling vaginal discharge with a change in the color from clear to white.   You develop vomiting that lasts more than 24 hours.   You develop chills or fever.   You develop shortness of breath.   You develop burning on urination.   You loose more than 2 pounds of weight or gain more than 2 pounds of weight or as suggested by your caregiver.   You notice sudden swelling of your face, hands, and feet or legs.   You develop belly (abdominal) pain. Round ligament discomfort is a common non-cancerous (benign) cause of abdominal pain in pregnancy. Your caregiver still must evaluate you.   You develop a severe headache that does not go away.   You develop visual problems, blurred or double vision.   If you have not felt  your baby move for more than 1 hour. If you think the baby is not moving as much as usual, eat something with sugar in it and lie down on your left side for an hour. The baby should move at least 4 to 5 times per hour. Call right away if your baby moves less than that.   You fall, are in a car accident or any kind of trauma.   There is mental or physical violence at home.  Document Released: 09/22/2001 Document Revised: 09/17/2011 Document Reviewed: 03/27/2009 [facility] Patient Information 2012 [place], [place]. Contraception Choices Contraception  (birth control) is the use of any methods or devices to prevent pregnancy. Below are some methods to help avoid pregnancy. HORMONAL METHODS   Contraceptive implant. This is a thin, plastic tube containing progesterone hormone. It does not contain estrogen hormone. Your caregiver inserts the tube in the inner part of the upper arm. The tube can remain in place for up to 3 years. After 3 years, the implant must be removed. The implant prevents the ovaries from releasing an egg (ovulation), thickens the cervical mucus which prevents sperm from entering the uterus, and thins the lining of the inside of the uterus.   Progesterone-only injections. These injections are given every 3 months by your caregiver to prevent pregnancy. This synthetic progesterone hormone stops the ovaries from releasing eggs. It also thickens cervical mucus and changes the uterine lining. This makes it harder for sperm to survive in the uterus.   Birth control pills. These pills contain estrogen and progesterone hormone. They work by stopping the egg from forming in the ovary (ovulation). Birth control pills are prescribed by a caregiver.Birth control pills can also be used to treat heavy periods.   Minipill. This type of birth control pill contains only the progesterone hormone. They are taken every day of each month and must be prescribed by your caregiver.   Birth control patch. The patch contains hormones similar to those in birth control pills. It must be changed once a week and is prescribed by a caregiver.   Vaginal ring. The ring contains hormones similar to those in birth control pills. It is left in the vagina for 3 weeks, removed for 1 week, and then a new one is put back in place. The patient must be comfortable inserting and removing the ring from the vagina.A caregiver's prescription is necessary.   Emergency contraception. Emergency contraceptives prevent pregnancy after unprotected sexual intercourse. This pill  can be taken right after sex or up to 5 days after unprotected sex. It is most effective the sooner you take the pills after having sexual intercourse. Emergency contraceptive pills are available without a prescription. Check with your pharmacist. Do not use emergency contraception as your only form of birth control.  BARRIER METHODS   Female condom. This is a thin sheath (latex or rubber) that is worn over the penis during sexual intercourse. It can be used with spermicide to increase effectiveness.   Female condom. This is a soft, loose-fitting sheath that is put into the vagina before sexual intercourse.   Diaphragm. This is a soft, latex, dome-shaped barrier that must be fitted by a caregiver. It is inserted into the vagina, along with a spermicidal jelly. It is inserted before intercourse. The diaphragm should be left in the vagina for 6 to 8 hours after intercourse.   Cervical cap. This is a round, soft, latex or plastic cup that fits over the cervix and must be fitted by  a caregiver. The cap can be left in place for up to 48 hours after intercourse.   Sponge. This is a soft, circular piece of polyurethane foam. The sponge has spermicide in it. It is inserted into the vagina after wetting it and before sexual intercourse.   Spermicides. These are chemicals that kill or block sperm from entering the cervix and uterus. They come in the form of creams, jellies, suppositories, foam, or tablets. They do not require a prescription. They are inserted into the vagina with an applicator before having sexual intercourse. The process must be repeated every time you have sexual intercourse.  INTRAUTERINE CONTRACEPTION  Intrauterine device (IUD). This is a T-shaped device that is put in a woman's uterus during a menstrual period to prevent pregnancy. There are 2 types:   Copper IUD. This type of IUD is wrapped in copper wire and is placed inside the uterus. Copper makes the uterus and fallopian tubes  produce a fluid that kills sperm. It can stay in place for 10 years.   Hormone IUD. This type of IUD contains the hormone progestin (synthetic progesterone). The hormone thickens the cervical mucus and prevents sperm from entering the uterus, and it also thins the uterine lining to prevent implantation of a fertilized egg. The hormone can weaken or kill the sperm that get into the uterus. It can stay in place for 5 years.  PERMANENT METHODS OF CONTRACEPTION  Female tubal ligation. This is when the woman's fallopian tubes are surgically sealed, tied, or blocked to prevent the egg from traveling to the uterus.   Female sterilization. This is when the female has the tubes that carry sperm tied off (vasectomy).This blocks sperm from entering the vagina during sexual intercourse. After the procedure, the man can still ejaculate fluid (semen).  NATURAL PLANNING METHODS  Natural family planning. This is not having sexual intercourse or using a barrier method (condom, diaphragm, cervical cap) on days the woman could become pregnant.   Calendar method. This is keeping track of the length of each menstrual cycle and identifying when you are fertile.   Ovulation method. This is avoiding sexual intercourse during ovulation.   Symptothermal method. This is avoiding sexual intercourse during ovulation, using a thermometer and ovulation symptoms.   Post-ovulation method. This is timing sexual intercourse after you have ovulated.  Regardless of which type or method of contraception you choose, it is important that you use condoms to protect against the transmission of sexually transmitted diseases (STDs). Talk with your caregiver about which form of contraception is most appropriate for you. Document Released: 09/28/2005 Document Revised: 09/17/2011 Document Reviewed: 02/04/2011 [facility] Patient Information 2012 [place], [place]. Breastfeeding BENEFITS OF BREASTFEEDING For the baby  The first milk (colostrum)  helps the baby's digestive system function better.   There are antibodies from the mother in the milk that help the baby fight off infections.   The baby has a lower incidence of asthma, allergies, and SIDS (sudden infant death syndrome).   The nutrients in breast milk are better than formulas for the baby and helps the baby's brain grow better.   Babies who breastfeed have less gas, colic, and constipation.  For the mother  Breastfeeding helps develop a very special bond between mother and baby.   It is more convenient, always available at the correct temperature and cheaper than formula feeding.   It burns calories in the mother and helps with losing weight that was gained during pregnancy.   It makes the uterus  contract back down to normal size faster and slows bleeding following delivery.   Breastfeeding mothers have a lower risk of developing breast cancer.  NURSE FREQUENTLY  A healthy, full-term baby may breastfeed as often as every hour or space his or her feedings to every 3 hours.   How often to nurse will vary from baby to baby. Watch your baby for signs of hunger, not the clock.   Nurse as often as the baby requests, or when you feel the need to reduce the fullness of your breasts.   Awaken the baby if it has been 3 to 4 hours since the last feeding.   Frequent feeding will help the mother make more milk and will prevent problems like sore nipples and engorgement of the breasts.  BABY'S POSITION AT THE BREAST  Whether lying down or sitting, be sure that the baby's tummy is facing your tummy.   Support the breast with 4 fingers underneath the breast and the thumb above. Make sure your fingers are well away from the nipple and baby's mouth.   Stroke the baby's lips and cheek closest to the breast gently with your finger or nipple.   When the baby's mouth is open wide enough, place all of your nipple and as much of the dark area around the nipple as possible into your  baby's mouth.   Pull the baby in close so the tip of the nose and the baby's cheeks touch the breast during the feeding.  FEEDINGS  The length of each feeding varies from baby to baby and from feeding to feeding.   The baby must suck about 2 to 3 minutes for your milk to get to him or her. This is called a "let down." For this reason, allow the baby to feed on each breast as long as he or she wants. Your baby will end the feeding when he or she has received the right balance of nutrients.   To break the suction, put your finger into the corner of the baby's mouth and slide it between his or her gums before removing your breast from his or her mouth. This will help prevent sore nipples.  REDUCING BREAST ENGORGEMENT  In the first week after your baby is born, you may experience signs of breast engorgement. When breasts are engorged, they feel heavy, warm, full, and may be tender to the touch. You can reduce engorgement if you:   Nurse frequently, every 2 to 3 hours. Mothers who breastfeed early and often have fewer problems with engorgement.   Place light ice packs on your breasts between feedings. This reduces swelling. Wrap the ice packs in a lightweight towel to protect your skin.   Apply moist hot packs to your breast for 5 to 10 minutes before each feeding. This increases circulation and helps the milk flow.   Gently massage your breast before and during the feeding.   Make sure that the baby empties at least one breast at every feeding before switching sides.   Use a breast pump to empty the breasts if your baby is sleepy or not nursing well. You may also want to pump if you are returning to work or or you feel you are getting engorged.   Avoid bottle feeds, pacifiers or supplemental feedings of water or juice in place of breastfeeding.   Be sure the baby is latched on and positioned properly while breastfeeding.   Prevent fatigue, stress, and anemia.   Wear a supportive bra,  avoiding underwire styles.   Eat a balanced diet with enough fluids.  If you follow these suggestions, your engorgement should improve in 24 to 48 hours. If you are still experiencing difficulty, call your lactation consultant or caregiver. IS MY BABY GETTING ENOUGH MILK? Sometimes, mothers worry about whether their babies are getting enough milk. You can be assured that your baby is getting enough milk if:  The baby is actively sucking and you hear swallowing.   The baby nurses at least 8 to 12 times in a 24 hour time period. Nurse your baby until he or she unlatches or falls asleep at the first breast (at least 10 to 20 minutes), then offer the second side.   The baby is wetting 5 to 6 disposable diapers (6 to 8 cloth diapers) in a 24 hour period by 53 to 23 days of age.   The baby is having at least 2 to 3 stools every 24 hours for the first few months. Breast milk is all the food your baby needs. It is not necessary for your baby to have water or formula. In fact, to help your breasts make more milk, it is best not to give your baby supplemental feedings during the early weeks.   The stool should be soft and yellow.   The baby should gain 4 to 7 ounces per week after he is 71 days old.  TAKE CARE OF YOURSELF Take care of your breasts by:  Bathing or showering daily.   Avoiding the use of soaps on your nipples.   Start feedings on your left breast at one feeding and on your right breast at the next feeding.   You will notice an increase in your milk supply 2 to 5 days after delivery. You may feel some discomfort from engorgement, which makes your breasts very firm and often tender. Engorgement "peaks" out within 24 to 48 hours. In the meantime, apply warm moist towels to your breasts for 5 to 10 minutes before feeding. Gentle massage and expression of some milk before feeding will soften your breasts, making it easier for your baby to latch on. Wear a well fitting nursing bra and air dry  your nipples for 10 to 15 minutes after each feeding.   Only use cotton bra pads.   Only use pure lanolin on your nipples after nursing. You do not need to wash it off before nursing.  Take care of yourself by:   Eating well-balanced meals and nutritious snacks.   Drinking milk, fruit juice, and water to satisfy your thirst (about 8 glasses a day).   Getting plenty of rest.   Increasing calcium in your diet (1200 mg a day).   Avoiding foods that you notice affect the baby in a bad way.  SEEK MEDICAL CARE IF:   You have any questions or difficulty with breastfeeding.   You need help.   You have a hard, red, sore area on your breast, accompanied by a fever of 100.5 F (38.1 C) or more.   Your baby is too sleepy to eat well or is having trouble sleeping.   Your baby is wetting less than 6 diapers per day, by 54 days of age.   Your baby's skin or white part of his or her eyes is more yellow than it was in the hospital.   You feel depressed.  Document Released: 09/28/2005 Document Revised: 09/17/2011 Document Reviewed: 05/13/2009 [facility] Patient Information 2012 [place], [place].

## 2012-06-02 ENCOUNTER — Ambulatory Visit (INDEPENDENT_AMBULATORY_CARE_PROVIDER_SITE_OTHER): Payer: Self-pay | Admitting: *Deleted

## 2012-06-02 VITALS — BP 96/59 | Wt 192.6 lb

## 2012-06-02 DIAGNOSIS — O99810 Abnormal glucose complicating pregnancy: Secondary | ICD-10-CM

## 2012-06-02 MED ORDER — INSULIN REGULAR HUMAN 100 UNIT/ML IJ SOLN: INTRAMUSCULAR | Status: DC

## 2012-06-02 NOTE — Progress Notes (Signed)
P-89 

## 2012-06-06 ENCOUNTER — Ambulatory Visit (INDEPENDENT_AMBULATORY_CARE_PROVIDER_SITE_OTHER): Payer: Self-pay | Admitting: Family Medicine

## 2012-06-06 ENCOUNTER — Encounter: Payer: Self-pay | Admitting: Family Medicine

## 2012-06-06 VITALS — BP 95/55 | Wt 192.6 lb

## 2012-06-06 DIAGNOSIS — O99810 Abnormal glucose complicating pregnancy: Secondary | ICD-10-CM

## 2012-06-06 DIAGNOSIS — O24419 Gestational diabetes mellitus in pregnancy, unspecified control: Secondary | ICD-10-CM

## 2012-06-06 LAB — POCT URINALYSIS DIP (DEVICE)
Leukocytes, UA: NEGATIVE
Nitrite: NEGATIVE
Protein, ur: NEGATIVE mg/dL
pH: 6.5 (ref 5.0–8.0)

## 2012-06-06 MED ORDER — INSULIN NPH (HUMAN) (ISOPHANE) 100 UNIT/ML ~~LOC~~ SUSP: SUBCUTANEOUS | Status: DC

## 2012-06-06 MED ORDER — INSULIN REGULAR HUMAN 100 UNIT/ML IJ SOLN: INTRAMUSCULAR | Status: DC

## 2012-06-06 NOTE — Patient Instructions (Addendum)
New insulin dosing: 33 u NPH (cloudy) mixed with 18 u Reg (clear) in the morning, no lunch insulin, 12 u Reg (clear) before evening meal, 12 u NPH (cloudy) at bedtime.  Gestational Diabetes Mellitus Gestational diabetes mellitus (GDM) is diabetes that occurs only during pregnancy. This happens when the body cannot properly handle the glucose (sugar) that increases in the blood after eating. During pregnancy, insulin resistance (reduced sensitivity to insulin) occurs because of the release of hormones from the placenta. Usually, the pancreas of pregnant women produces enough insulin to overcome the resistance that occurs. However, in gestational diabetes, the insulin is there but it does not work effectively. If the resistance is severe enough that the pancreas does not produce enough insulin, extra glucose builds up in the blood.  WHO IS AT RISK FOR DEVELOPING GESTATIONAL DIABETES?  Women with a history of diabetes in the family.   Women over age 36.   Women who are overweight.   Women in certain ethnic groups (Hispanic, African American, Native American, [place] and [place]er).  WHAT CAN HAPPEN TO THE BABY? If the mother's blood glucose is too high while she is pregnant, the extra sugar will travel through the umbilical cord to the baby. Some of the problems the baby may have are:  Large Baby - If the baby receives too much sugar, the baby will gain more weight. This may cause the baby to be too large to be born normally (vaginally) and a Cesarean section (C-section) may be needed.   Low Blood Glucose (hypoglycemia) - The baby makes extra insulin, in response to the extra sugar its gets from its mother. When the baby is born and no longer needs this extra insulin, the baby's blood glucose level may drop.   Jaundice (yellow coloring of the skin and eyes) - This is fairly common in babies. It is caused from a build-up of the chemical called bilirubin. This is rarely serious, but is seen more  often in babies whose mothers had gestational diabetes.  RISKS TO THE MOTHER Women who have had gestational diabetes may be at higher risk for some problems, including:  Preeclampsia or toxemia, which includes problems with high blood pressure. Blood pressure and protein levels in the urine must be checked frequently.   Infections.   Cesarean section (C-section) for delivery.   Developing Type 2 diabetes later in life. About 30-50% will develop diabetes later, especially if obese.  DIAGNOSIS  The hormones that cause insulin resistance are highest at about 24-28 weeks of pregnancy. If symptoms are experienced, they are much like symptoms you would normally expect during pregnancy.  GDM is often diagnosed using a two part method: 1. After 24-28 weeks of pregnancy, the woman drinks a glucose solution and takes a blood test. If the glucose level is high, a second test will be given.  2. Oral Glucose Tolerance Test (OGTT) which is 3 hours long - After not eating overnight, the blood glucose is checked. The woman drinks a glucose solution, and hourly blood glucose tests are taken.  If the woman has risk factors for GDM, the caregiver may test earlier than 24 weeks of pregnancy. TREATMENT  Treatment of GDM is directed at keeping the mother's blood glucose level normal, and may include:  Meal planning.   Taking insulin or other medicine to control your blood glucose level.   Exercise.   Keeping a daily record of the foods you eat.   Blood glucose monitoring and keeping a record of  your blood glucose levels.   May monitor ketone levels in the urine, although this is no longer considered necessary in most pregnancies.  HOME CARE INSTRUCTIONS  While you are pregnant:  Follow your caregiver's advice regarding your prenatal appointments, meal planning, exercise, medicines, vitamins, blood and other tests, and physical activities.   Keep a record of your meals, blood glucose tests, and the  amount of insulin you are taking (if any). Show this to your caregiver at every prenatal visit.   If you have GDM, you may have problems with hypoglycemia (low blood glucose). You may suspect this if you become suddenly dizzy, feel shaky, and/or weak. If you think this is happening and you have a glucose meter, try to test your blood glucose level. Follow your caregiver's advice for when and how to treat your low blood glucose. Generally, the 15:15 rule is followed: Treat by consuming 15 grams of carbohydrates, wait 15 minutes, and recheck blood glucose. Examples of 15 grams of carbohydrates are:   1 cup skim or low-fat milk.    cup juice.   3-4 glucose tablets.   5-6 hard candies.   1 small box raisins.    cup regular soda pop.   Practice good hygiene, to avoid infections.   Do not smoke.  SEEK MEDICAL CARE IF:   You develop abnormal vaginal discharge, with or without itching.   You become weak and tired more than expected.   You seem to sweat a lot.   You have a sudden increase in weight, 5 pounds or more in one week.   You are losing weight, 3 pounds or more in a week.   Your blood glucose level is high, and you need instructions on what to do about it.  SEEK IMMEDIATE MEDICAL CARE IF:   You develop a severe headache.   You faint or pass out.   You develop nausea and vomiting.   You become disoriented or confused.   You have a convulsion.   You develop vision problems.   You develop stomach pain.   You develop vaginal bleeding.   You develop uterine contractions.   You have leaking or a gush of fluid from the vagina.  AFTER YOU HAVE THE BABY:  Go to all of your follow-up appointments, and have blood tests as advised by your caregiver.   Maintain a healthy lifestyle, to prevent diabetes in the future. This includes:   Following a healthy meal plan.   Controlling your weight.   Getting enough exercise and proper rest.   Do not smoke.   Breastfeed  your baby if you can. This will lower the chance of you and your baby developing diabetes later in life.  For more information about diabetes, go to the American Diabetes Association at: PMFashions.com.cy. For more information about gestational diabetes, go to the [employer] at: RentRule.com.au. Document Released: 01/04/2001 Document Revised: 09/17/2011 Document Reviewed: 07/29/2009 [facility] Patient Information 2012 [place], [place]. Breastfeeding BENEFITS OF BREASTFEEDING For the baby  The first milk (colostrum) helps the baby's digestive system function better.   There are antibodies from the mother in the milk that help the baby fight off infections.   The baby has a lower incidence of asthma, allergies, and SIDS (sudden infant death syndrome).   The nutrients in breast milk are better than formulas for the baby and helps the baby's brain grow better.   Babies who breastfeed have less gas, colic, and constipation.  For the  mother  Breastfeeding helps develop a very special bond between mother and baby.   It is more convenient, always available at the correct temperature and cheaper than formula feeding.   It burns calories in the mother and helps with losing weight that was gained during pregnancy.   It makes the uterus contract back down to normal size faster and slows bleeding following delivery.   Breastfeeding mothers have a lower risk of developing breast cancer.  NURSE FREQUENTLY  A healthy, full-term baby may breastfeed as often as every hour or space his or her feedings to every 3 hours.   How often to nurse will vary from baby to baby. Watch your baby for signs of hunger, not the clock.   Nurse as often as the baby requests, or when you feel the need to reduce the fullness of your breasts.   Awaken the baby if it has been 3 to 4 hours since the last feeding.   Frequent feeding will help the mother make more  milk and will prevent problems like sore nipples and engorgement of the breasts.  BABY'S POSITION AT THE BREAST  Whether lying down or sitting, be sure that the baby's tummy is facing your tummy.   Support the breast with 4 fingers underneath the breast and the thumb above. Make sure your fingers are well away from the nipple and baby's mouth.   Stroke the baby's lips and cheek closest to the breast gently with your finger or nipple.   When the baby's mouth is open wide enough, place all of your nipple and as much of the dark area around the nipple as possible into your baby's mouth.   Pull the baby in close so the tip of the nose and the baby's cheeks touch the breast during the feeding.  FEEDINGS  The length of each feeding varies from baby to baby and from feeding to feeding.   The baby must suck about 2 to 3 minutes for your milk to get to him or her. This is called a "let down." For this reason, allow the baby to feed on each breast as long as he or she wants. Your baby will end the feeding when he or she has received the right balance of nutrients.   To break the suction, put your finger into the corner of the baby's mouth and slide it between his or her gums before removing your breast from his or her mouth. This will help prevent sore nipples.  REDUCING BREAST ENGORGEMENT  In the first week after your baby is born, you may experience signs of breast engorgement. When breasts are engorged, they feel heavy, warm, full, and may be tender to the touch. You can reduce engorgement if you:   Nurse frequently, every 2 to 3 hours. Mothers who breastfeed early and often have fewer problems with engorgement.   Place light ice packs on your breasts between feedings. This reduces swelling. Wrap the ice packs in a lightweight towel to protect your skin.   Apply moist hot packs to your breast for 5 to 10 minutes before each feeding. This increases circulation and helps the milk flow.   Gently  massage your breast before and during the feeding.   Make sure that the baby empties at least one breast at every feeding before switching sides.   Use a breast pump to empty the breasts if your baby is sleepy or not nursing well. You may also want to pump if you are  returning to work or or you feel you are getting engorged.   Avoid bottle feeds, pacifiers or supplemental feedings of water or juice in place of breastfeeding.   Be sure the baby is latched on and positioned properly while breastfeeding.   Prevent fatigue, stress, and anemia.   Wear a supportive bra, avoiding underwire styles.   Eat a balanced diet with enough fluids.  If you follow these suggestions, your engorgement should improve in 24 to 48 hours. If you are still experiencing difficulty, call your lactation consultant or caregiver. IS MY BABY GETTING ENOUGH MILK? Sometimes, mothers worry about whether their babies are getting enough milk. You can be assured that your baby is getting enough milk if:  The baby is actively sucking and you hear swallowing.   The baby nurses at least 8 to 12 times in a 24 hour time period. Nurse your baby until he or she unlatches or falls asleep at the first breast (at least 10 to 20 minutes), then offer the second side.   The baby is wetting 5 to 6 disposable diapers (6 to 8 cloth diapers) in a 24 hour period by 28 to 3 days of age.   The baby is having at least 2 to 3 stools every 24 hours for the first few months. Breast milk is all the food your baby needs. It is not necessary for your baby to have water or formula. In fact, to help your breasts make more milk, it is best not to give your baby supplemental feedings during the early weeks.   The stool should be soft and yellow.   The baby should gain 4 to 7 ounces per week after he is 78 days old.  TAKE CARE OF YOURSELF Take care of your breasts by:  Bathing or showering daily.   Avoiding the use of soaps on your nipples.   Start  feedings on your left breast at one feeding and on your right breast at the next feeding.   You will notice an increase in your milk supply 2 to 5 days after delivery. You may feel some discomfort from engorgement, which makes your breasts very firm and often tender. Engorgement "peaks" out within 24 to 48 hours. In the meantime, apply warm moist towels to your breasts for 5 to 10 minutes before feeding. Gentle massage and expression of some milk before feeding will soften your breasts, making it easier for your baby to latch on. Wear a well fitting nursing bra and air dry your nipples for 10 to 15 minutes after each feeding.   Only use cotton bra pads.   Only use pure lanolin on your nipples after nursing. You do not need to wash it off before nursing.  Take care of yourself by:   Eating well-balanced meals and nutritious snacks.   Drinking milk, fruit juice, and water to satisfy your thirst (about 8 glasses a day).   Getting plenty of rest.   Increasing calcium in your diet (1200 mg a day).   Avoiding foods that you notice affect the baby in a bad way.  SEEK MEDICAL CARE IF:   You have any questions or difficulty with breastfeeding.   You need help.   You have a hard, red, sore area on your breast, accompanied by a fever of 100.5 F (38.1 C) or more.   Your baby is too sleepy to eat well or is having trouble sleeping.   Your baby is wetting less than 6 diapers per  day, by 79 days of age.   Your baby's skin or white part of his or her eyes is more yellow than it was in the hospital.   You feel depressed.  Document Released: 09/28/2005 Document Revised: 09/17/2011 Document Reviewed: 05/13/2009 [facility] Patient Information 2012 [place], [place].

## 2012-06-06 NOTE — Progress Notes (Signed)
NST reviewed and reactive. BS reviewed.  Could not afford Novolog, so switched to reg.  BS low to 63, and low's overnight.  Will adjust dosing to standard 3x/day 2/3 in am dosing for NPH and Reg.  33u NPH am, 12 u NPH at hs.  18 u Reg am, 12 u Reg q ac dinner. Cultures today Schedule u/s for growth at 38 wks.

## 2012-06-06 NOTE — Progress Notes (Signed)
P= 96          Pt started insulin on 8/24. She states she has been taking Novolin N 22 units @ breakfast and 24 units @ bedtime- she thought this was her prescribed dose.

## 2012-06-09 ENCOUNTER — Ambulatory Visit (INDEPENDENT_AMBULATORY_CARE_PROVIDER_SITE_OTHER): Payer: Self-pay | Admitting: *Deleted

## 2012-06-09 VITALS — BP 108/66 | Wt 193.1 lb

## 2012-06-09 DIAGNOSIS — O99810 Abnormal glucose complicating pregnancy: Secondary | ICD-10-CM

## 2012-06-09 NOTE — Progress Notes (Signed)
P = 105  [place] growth scheduled 06/17/12 @ 0945

## 2012-06-14 ENCOUNTER — Ambulatory Visit (INDEPENDENT_AMBULATORY_CARE_PROVIDER_SITE_OTHER): Payer: Self-pay | Admitting: *Deleted

## 2012-06-14 VITALS — BP 96/88 | Wt 193.7 lb

## 2012-06-14 DIAGNOSIS — O99810 Abnormal glucose complicating pregnancy: Secondary | ICD-10-CM

## 2012-06-14 NOTE — Progress Notes (Signed)
P - 104 

## 2012-06-14 NOTE — Progress Notes (Signed)
NST reviewed and reactive.  Erika Potts, M.D., FACOG    

## 2012-06-16 NOTE — Progress Notes (Signed)
NST reactive on 05/26/12 

## 2012-06-17 ENCOUNTER — Ambulatory Visit (HOSPITAL_COMMUNITY)
Admission: RE | Admit: 2012-06-17 | Discharge: 2012-06-17 | Disposition: A | Discharge status: Home / Self Care | Payer: Self-pay | Source: Ambulatory Visit | Attending: Obstetrics and Gynecology | Admitting: Obstetrics and Gynecology

## 2012-06-17 ENCOUNTER — Ambulatory Visit (INDEPENDENT_AMBULATORY_CARE_PROVIDER_SITE_OTHER): Payer: Self-pay | Admitting: *Deleted

## 2012-06-17 VITALS — BP 104/53 | Wt 194.7 lb

## 2012-06-17 DIAGNOSIS — O0930 Supervision of pregnancy with insufficient antenatal care, unspecified trimester: Secondary | ICD-10-CM

## 2012-06-17 DIAGNOSIS — O0990 Supervision of high risk pregnancy, unspecified, unspecified trimester: Secondary | ICD-10-CM

## 2012-06-17 DIAGNOSIS — O24419 Gestational diabetes mellitus in pregnancy, unspecified control: Secondary | ICD-10-CM

## 2012-06-17 DIAGNOSIS — O99810 Abnormal glucose complicating pregnancy: Secondary | ICD-10-CM

## 2012-06-17 DIAGNOSIS — O3660X0 Maternal care for excessive fetal growth, unspecified trimester, not applicable or unspecified: Secondary | ICD-10-CM

## 2012-06-17 DIAGNOSIS — O99820 Streptococcus B carrier state complicating pregnancy: Secondary | ICD-10-CM

## 2012-06-17 NOTE — Progress Notes (Signed)
P = 94   [place] growth today.

## 2012-06-20 ENCOUNTER — Telehealth (HOSPITAL_COMMUNITY): Payer: Self-pay | Admitting: *Deleted

## 2012-06-20 ENCOUNTER — Encounter (HOSPITAL_COMMUNITY): Payer: Self-pay | Admitting: *Deleted

## 2012-06-20 ENCOUNTER — Ambulatory Visit (INDEPENDENT_AMBULATORY_CARE_PROVIDER_SITE_OTHER): Payer: Self-pay | Admitting: Obstetrics & Gynecology

## 2012-06-20 VITALS — BP 109/63 | Wt 195.2 lb

## 2012-06-20 DIAGNOSIS — O0930 Supervision of pregnancy with insufficient antenatal care, unspecified trimester: Secondary | ICD-10-CM

## 2012-06-20 DIAGNOSIS — O3660X0 Maternal care for excessive fetal growth, unspecified trimester, not applicable or unspecified: Secondary | ICD-10-CM

## 2012-06-20 DIAGNOSIS — O99820 Streptococcus B carrier state complicating pregnancy: Secondary | ICD-10-CM

## 2012-06-20 DIAGNOSIS — O24419 Gestational diabetes mellitus in pregnancy, unspecified control: Secondary | ICD-10-CM

## 2012-06-20 DIAGNOSIS — B951 Streptococcus, group B, as the cause of diseases classified elsewhere: Secondary | ICD-10-CM

## 2012-06-20 DIAGNOSIS — O0990 Supervision of high risk pregnancy, unspecified, unspecified trimester: Secondary | ICD-10-CM

## 2012-06-20 DIAGNOSIS — O99810 Abnormal glucose complicating pregnancy: Secondary | ICD-10-CM

## 2012-06-20 LAB — POCT URINALYSIS DIP (DEVICE)
Glucose, UA: NEGATIVE mg/dL
Hgb urine dipstick: NEGATIVE
Specific Gravity, Urine: 1.02 (ref 1.005–1.030)
Urobilinogen, UA: 0.2 mg/dL (ref 0.0–1.0)
pH: 6.5 (ref 5.0–8.0)

## 2012-06-20 NOTE — Progress Notes (Signed)
P = 101     [place] growth done 9/6.    IOL scheduled 06/25/12 @ 0700

## 2012-06-20 NOTE — Progress Notes (Signed)
NST performed today was reviewed and was found to be reactive.  Continue recommended antenatal testing and prenatal care.  9/6 EFW 3666g/>90%, AFI 11.58, vertex. Good blood sugars. No other complaints or concerns.  Fetal movement and labor precautions reviewed. IOL scheduled 06/25/12.

## 2012-06-20 NOTE — Progress Notes (Signed)
Diabetes Education:  Provided box of True Track strips [id number] Exp: 09/08/14.  Maggie Danyel Tobey, RN, CDE

## 2012-06-20 NOTE — Patient Instructions (Signed)
Return to clinic for any obstetric concerns or go to [facility] for evaluation  

## 2012-06-20 NOTE — Telephone Encounter (Signed)
Preadmission screen  

## 2012-06-23 ENCOUNTER — Ambulatory Visit (INDEPENDENT_AMBULATORY_CARE_PROVIDER_SITE_OTHER): Payer: Self-pay | Admitting: *Deleted

## 2012-06-23 VITALS — BP 117/65 | Wt 195.5 lb

## 2012-06-23 DIAGNOSIS — O99810 Abnormal glucose complicating pregnancy: Secondary | ICD-10-CM

## 2012-06-23 DIAGNOSIS — O24419 Gestational diabetes mellitus in pregnancy, unspecified control: Secondary | ICD-10-CM

## 2012-06-23 NOTE — Progress Notes (Signed)
NST performed today was reviewed and was found to be reactive. AFI 18.5 cm.  Scheduled for IOL on 06/25/12.

## 2012-06-23 NOTE — Progress Notes (Signed)
P = 108  Pt is scheduled for IOL on 06/25/12.

## 2012-06-25 ENCOUNTER — Inpatient Hospital Stay (HOSPITAL_COMMUNITY)
Admission: RE | Admit: 2012-06-25 | Discharge: 2012-06-27 | DRG: 775 | Disposition: A | Discharge status: Home / Self Care | Payer: Medicaid Other | Source: Ambulatory Visit | Attending: Obstetrics and Gynecology | Admitting: Obstetrics and Gynecology

## 2012-06-25 VITALS — BP 129/72 | HR 84 | Temp 98.3°F | Resp 18 | Ht 64.0 in | Wt 195.0 lb

## 2012-06-25 DIAGNOSIS — O3660X0 Maternal care for excessive fetal growth, unspecified trimester, not applicable or unspecified: Secondary | ICD-10-CM

## 2012-06-25 DIAGNOSIS — O99820 Streptococcus B carrier state complicating pregnancy: Secondary | ICD-10-CM

## 2012-06-25 DIAGNOSIS — O99814 Abnormal glucose complicating childbirth: Principal | ICD-10-CM | POA: Diagnosis present

## 2012-06-25 DIAGNOSIS — O0990 Supervision of high risk pregnancy, unspecified, unspecified trimester: Secondary | ICD-10-CM

## 2012-06-25 DIAGNOSIS — O0930 Supervision of pregnancy with insufficient antenatal care, unspecified trimester: Secondary | ICD-10-CM

## 2012-06-25 DIAGNOSIS — O24419 Gestational diabetes mellitus in pregnancy, unspecified control: Secondary | ICD-10-CM

## 2012-06-25 LAB — CBC
HCT: 32.5 % — ABNORMAL LOW (ref 36.0–46.0)
MCH: 22.3 pg — ABNORMAL LOW (ref 26.0–34.0)
MCHC: 31.7 g/dL (ref 30.0–36.0)
MCV: 70.3 fL — ABNORMAL LOW (ref 78.0–100.0)
RDW: 16.6 % — ABNORMAL HIGH (ref 11.5–15.5)

## 2012-06-25 LAB — GLUCOSE, CAPILLARY
Glucose-Capillary: 112 mg/dL — ABNORMAL HIGH (ref 70–99)
Glucose-Capillary: 85 mg/dL (ref 70–99)
Glucose-Capillary: 88 mg/dL (ref 70–99)
Glucose-Capillary: 74 mg/dL (ref 70–99)
Glucose-Capillary: 77 mg/dL (ref 70–99)
Glucose-Capillary: 82 mg/dL (ref 70–99)
Glucose-Capillary: 131 mg/dL — ABNORMAL HIGH (ref 70–99)

## 2012-06-25 LAB — TYPE AND SCREEN: ABO/RH(D): B POS

## 2012-06-25 MED ORDER — CITRIC ACID-SODIUM CITRATE 334-500 MG/5ML PO SOLN: 30.0000 mL | ORAL | Status: DC | PRN

## 2012-06-25 MED ORDER — OXYCODONE-ACETAMINOPHEN 5-325 MG PO TABS: 1.0000 | ORAL_TABLET | ORAL | Status: DC | PRN

## 2012-06-25 MED ORDER — SODIUM CHLORIDE 0.9 % IV SOLN: INTRAVENOUS | Status: DC

## 2012-06-25 MED ORDER — DEXTROSE-NACL 5-0.45 % IV SOLN
INTRAVENOUS | Status: DC
  Administered 2012-06-25: 13:00:00 via INTRAVENOUS

## 2012-06-25 MED ORDER — FLEET ENEMA 7-19 GM/118ML RE ENEM: 1.0000 | ENEMA | RECTAL | Status: DC | PRN

## 2012-06-25 MED ORDER — FENTANYL CITRATE 0.05 MG/ML IJ SOLN
50.0000 ug | INTRAMUSCULAR | Status: DC | PRN
  Filled 2012-06-25: qty 2

## 2012-06-25 MED ORDER — OXYTOCIN 40 UNITS IN LACTATED RINGERS INFUSION - SIMPLE MED
62.5000 mL/h | Freq: Once | INTRAVENOUS | Status: AC
  Administered 2012-06-25: 999 mL/h via INTRAVENOUS
  Filled 2012-06-25: qty 1000

## 2012-06-25 MED ORDER — ACETAMINOPHEN 325 MG PO TABS: 650.0000 mg | ORAL_TABLET | ORAL | Status: DC | PRN

## 2012-06-25 MED ORDER — OXYTOCIN 40 UNITS IN LACTATED RINGERS INFUSION - SIMPLE MED
1.0000 m[IU]/min | INTRAVENOUS | Status: DC
  Administered 2012-06-25: 1 m[IU]/min via INTRAVENOUS
  Filled 2012-06-25: qty 1000

## 2012-06-25 MED ORDER — INSULIN REGULAR HUMAN 100 UNIT/ML IJ SOLN
INTRAMUSCULAR | Status: DC
  Administered 2012-06-25: 0.2 [IU]/h via INTRAVENOUS
  Filled 2012-06-25: qty 1

## 2012-06-25 MED ORDER — HYDROXYZINE HCL 50 MG/ML IM SOLN
50.0000 mg | Freq: Four times a day (QID) | INTRAMUSCULAR | Status: DC | PRN
  Filled 2012-06-25: qty 1

## 2012-06-25 MED ORDER — LIDOCAINE HCL (PF) 1 % IJ SOLN
30.0000 mL | INTRAMUSCULAR | Status: DC | PRN
  Filled 2012-06-25: qty 30

## 2012-06-25 MED ORDER — LACTATED RINGERS IV SOLN: 500.0000 mL | INTRAVENOUS | Status: DC | PRN

## 2012-06-25 MED ORDER — OXYTOCIN BOLUS FROM INFUSION
500.0000 mL | Freq: Once | INTRAVENOUS | Status: DC
  Filled 2012-06-25: qty 500

## 2012-06-25 MED ORDER — PENICILLIN G POTASSIUM 5000000 UNITS IJ SOLR
2.5000 10*6.[IU] | INTRAVENOUS | Status: DC
  Administered 2012-06-25 (×2): 2.5 10*6.[IU] via INTRAVENOUS
  Filled 2012-06-25 (×7): qty 2.5

## 2012-06-25 MED ORDER — IBUPROFEN 600 MG PO TABS
600.0000 mg | ORAL_TABLET | Freq: Four times a day (QID) | ORAL | Status: DC | PRN
  Administered 2012-06-25: 600 mg via ORAL
  Filled 2012-06-25: qty 1

## 2012-06-25 MED ORDER — ONDANSETRON HCL 4 MG/2ML IJ SOLN: 4.0000 mg | Freq: Four times a day (QID) | INTRAMUSCULAR | Status: DC | PRN

## 2012-06-25 MED ORDER — NALBUPHINE SYRINGE 5 MG/0.5 ML: 5.0000 mg | INJECTION | INTRAMUSCULAR | Status: DC | PRN

## 2012-06-25 MED ORDER — HYDROXYZINE HCL 50 MG PO TABS: 50.0000 mg | ORAL_TABLET | Freq: Four times a day (QID) | ORAL | Status: DC | PRN

## 2012-06-25 MED ORDER — PENICILLIN G POTASSIUM 5000000 UNITS IJ SOLR
5.0000 10*6.[IU] | Freq: Once | INTRAVENOUS | Status: AC
  Administered 2012-06-25: 5 10*6.[IU] via INTRAVENOUS
  Filled 2012-06-25: qty 5

## 2012-06-25 MED ORDER — TERBUTALINE SULFATE 1 MG/ML IJ SOLN: 0.2500 mg | Freq: Once | INTRAMUSCULAR | Status: AC | PRN

## 2012-06-25 MED ORDER — DEXTROSE 50 % IV SOLN: 25.0000 mL | INTRAVENOUS | Status: DC | PRN

## 2012-06-25 NOTE — Progress Notes (Signed)
Erika Potts is a 31 y.o. [id number] at [redacted]w[redacted]d admitted for induction of labor due to A2DM.  Subjective: Feeling some pain w/ uc's.  Doesn't want pain meds or epidural.  States always has to have water broken for labor. FOB supportive at bs.  Objective: BP 122/67  Pulse 84  Temp 98.7 F (37.1 C) (Oral)  Resp 18  Ht 5\' 4"  (1.626 m)  Wt 88.451 kg (195 lb)  BMI 33.47 kg/m2  LMP 09/26/2011    CBGs have been wnl, only has required insulin per glucostabilizer protocol x 1 since admission  FHT:  FHR: 135 bpm, variability: moderate,  accelerations:  Present,  decelerations:  Absent UC:   regular, every 1-4 minutes SVE:   Dilation: 4.5 Effacement (%): 50 Station: -2 Exam by:: Okie Jansson,CNM More anterior,  AROM large amount clear fluid  Labs: Lab Results  Component Value Date   WBC 7.5 06/25/2012   HGB 10.3* 06/25/2012   HCT 32.5* 06/25/2012   MCV 70.3* 06/25/2012   PLT 190 06/25/2012    Assessment / Plan: IOL d/t A2DM, now AROM'd clear fluid, on pitocin @11mu /min  Labor: progressing slowly on pitocin, now arom'd Preeclampsia:  n/a Fetal Wellbeing:  Category I Pain Control:  Labor support without medications I/D:  PCN per protocol for gbs+ Anticipated MOD:  NSVD  Marge Duncans 06/25/2012, 9:25 PM

## 2012-06-25 NOTE — Progress Notes (Signed)
Erika Potts is a 31 y.o. [id number] at [redacted]w[redacted]d admitted for induction of labor due to A2DM.  Subjective: Dealing well w/ uc's, states they are getting painful  Objective: BP 113/70  Pulse 100  Temp 98.7 F (37.1 C) (Oral)  Resp 20  Ht 5\' 4"  (1.626 m)  Wt 88.451 kg (195 lb)  BMI 33.47 kg/m2  LMP 09/26/2011      FHT:  FHR: 130 bpm, variability: moderate,  accelerations:  Present,  decelerations:  Absent UC:   regular, every 2-4 minutes, moderate to palpation SVE:   Dilation: 3 Effacement (%): Thick Station: -2 Exam by:: kim Neelie Welshans, cnm Very posterior and unable to reach cervix initially, had pt place fists under buttocks and was able to reach- unable to determine presenting part by exam BS u/s confirmed vtx presentation  Labs: Lab Results  Component Value Date   WBC 7.5 06/25/2012   HGB 10.3* 06/25/2012   HCT 32.5* 06/25/2012   MCV 70.3* 06/25/2012   PLT 190 06/25/2012    Assessment / Plan: IOL d/t A2DM, on pitocin @ 49mu/min, continue to increase per protocol  Labor: not yet Preeclampsia:  n/a Fetal Wellbeing:  Category I Pain Control:  Labor support without medications I/D:  PCN per protocol when in active labor Anticipated MOD:  NSVD  Marge Duncans 06/25/2012, 4:28 PM

## 2012-06-25 NOTE — H&P (Signed)
Erika Potts is a 31 y.o. G63P3003  female at [redacted]w[redacted]d by LMP which correlates well w/ 2nd trimester u/s, presenting for IOL d/t A2DM.  H/O GDM w/ previous pregnancy as well.  Begun on insulin at 31.2wks, current regimen: NPH 33units a am, 12units q pm, Regular 18units q am, 12units before dinner. Per pt, FBS ranges 80-92, 2hr pp ranges 112-125.  Did not take insulin this am. CBG on admission 112.  U/S on 9/6: cephalic, AFI 11.58, 8lb 1oz >90%, w/ continued prominent bladder, outlet difficult to see, no ureterelectasis or pyelectasis, possible partial bladder outlet obstruction- recommend postnatal u/s.  Pelvis proven to 8lb 6oz x 2.  Late to care @ [facility] @ 27.2wks.  Reports good fm, denies LOF, VB.  Perceives some uc's.   Maternal Medical History:  Reason for admission: Reason for Admission:   nauseaIOL for A2DM  Contractions: Frequency: irregular.   Perceived severity is mild.    Fetal activity: Perceived fetal activity is normal.   Last perceived fetal movement was within the past hour.    Prenatal complications: Late onset care @ 27.2wks, LGA >90%  Prenatal Complications - Diabetes: gestational. Diabetes is managed by insulin injections.   A2DM, insulin since 31.2wks, GDM w/ previous pregnancy as well  OB History    Grav Para Term Preterm Abortions TAB SAB Ect Mult Living   4 3 3       3      Past Medical History  Diagnosis Date  . Gestational diabetes   . Abnormal Pap smear     ASCUS 2007  . Dementia     previous pregnancy   Past Surgical History  Procedure Date  . No past surgeries    Family History: family history is not on file. Social History:  reports that she has never smoked. She has never used smokeless tobacco. She reports that she does not drink alcohol or use illicit drugs.   Prenatal Transfer Tool  Maternal Diabetes: Yes:  Diabetes Type:  Insulin/Medication controlled Genetic Screening: Declined Maternal Ultrasounds/Referrals: Abnormal:  Findings:    Other:LGA >90%, prominent bladder, can not r/o partial bladder outlet obstruction- recommend postnatal u/s Fetal Ultrasounds or other Referrals:  None Maternal Substance Abuse:  No Significant Maternal Medications:  Meds include: Other: NPH & Regular insulin Significant Maternal Lab Results:  Lab values include: Group B Strep positive Other Comments:  Late to care @ 27.2wks  Review of Systems  Constitutional: Negative.   HENT: Negative.   Eyes: Negative.  Negative for blurred vision and double vision.  Respiratory: Negative.   Cardiovascular: Negative.   Gastrointestinal: Negative.  Negative for heartburn, nausea, vomiting and abdominal pain.  Genitourinary: Negative.   Musculoskeletal: Negative.   Skin: Negative.   Neurological: Negative.  Negative for headaches.  Endo/Heme/Allergies: Negative.   Psychiatric/Behavioral: Negative.     Dilation: 3 Effacement (%): Thick Station: -2 Exam by:: kim Sharice Harriss, cnm Blood pressure 113/77, pulse 103, temperature 98.5 F (36.9 C), temperature source Oral, resp. rate 16, height 5\' 4"  (1.626 m), weight 88.451 kg (195 lb), last menstrual period 09/26/2011. Maternal Exam:  Uterine Assessment: Contraction strength is mild.  Contraction frequency is irregular.   Abdomen: Patient reports no abdominal tenderness. Estimated fetal weight is 9/6: 8lb 1oz >90%.   Fetal presentation: vertex  Introitus: Normal vulva. Normal vagina.  Pelvis: adequate for delivery.   Cervix: Cervix evaluated by digital exam.     Fetal Exam Fetal Monitor Review: Mode: ultrasound.   Baseline rate: 135.  Variability:  moderate (6-25 bpm).   Pattern: accelerations present and no decelerations.    Fetal State Assessment: Category I - tracings are normal.     Physical Exam  Constitutional: She is oriented to person, place, and time. She appears well-developed and well-nourished.  HENT:  Head: Normocephalic.  Neck: Normal range of motion.  Cardiovascular: Normal  rate and regular rhythm.   Respiratory: Effort normal and breath sounds normal.  GI: Soft.       gravid  Genitourinary: Vagina normal and uterus normal.  Musculoskeletal: Normal range of motion. She exhibits no edema.  Neurological: She is alert and oriented to person, place, and time. She has normal reflexes.  Skin: Skin is warm and dry.  Psychiatric: She has a normal mood and affect. Her behavior is normal. Judgment and thought content normal.      Prenatal labs: ABO, Rh: B/Positive/-- (06/12 0000) Antibody: Negative (06/12 0000) Rubella: Immune (06/12 0000) RPR: Nonreactive (06/12 0000)  HBsAg: Negative (06/12 0000)  HIV: Non-reactive (06/12 0000)  GBS: Positive (06/12 0000)   Pt & POC discussed w/ Dr. Jolayne Panther  Assessment/Plan: A:  [redacted]w[redacted]d SIUP  IOL for A2DM, insulin dependent since 31.2wks  Cat I FHR  GBS pos  LGA >90% on 9/6 u/s  Possible fetal partial bladder outlet obstruction- recommend postnatal u/s  Late onset care @ 27.2wks   P:  Admit to BS for IOL  Pitocin per low-dose protocol  Glucostabilizer per protocol  PCN per protocol when in active labor  Anticipate NSVD   Marge Duncans 06/25/2012, 11:45 AM

## 2012-06-26 ENCOUNTER — Encounter (HOSPITAL_COMMUNITY): Payer: Self-pay

## 2012-06-26 MED ORDER — BISACODYL 10 MG RE SUPP: 10.0000 mg | Freq: Every day | RECTAL | Status: DC | PRN

## 2012-06-26 MED ORDER — MEASLES, MUMPS & RUBELLA VAC ~~LOC~~ INJ
0.5000 mL | INJECTION | Freq: Once | SUBCUTANEOUS | Status: DC
  Filled 2012-06-26: qty 0.5

## 2012-06-26 MED ORDER — SENNOSIDES-DOCUSATE SODIUM 8.6-50 MG PO TABS
2.0000 | ORAL_TABLET | Freq: Every day | ORAL | Status: DC
  Administered 2012-06-26: 2 via ORAL

## 2012-06-26 MED ORDER — SODIUM CHLORIDE 0.9 % IJ SOLN: 3.0000 mL | Freq: Two times a day (BID) | INTRAMUSCULAR | Status: DC

## 2012-06-26 MED ORDER — SODIUM CHLORIDE 0.9 % IJ SOLN: 3.0000 mL | INTRAMUSCULAR | Status: DC | PRN

## 2012-06-26 MED ORDER — DIBUCAINE 1 % RE OINT: 1.0000 "application " | TOPICAL_OINTMENT | RECTAL | Status: DC | PRN

## 2012-06-26 MED ORDER — WITCH HAZEL-GLYCERIN EX PADS: 1.0000 "application " | MEDICATED_PAD | CUTANEOUS | Status: DC | PRN

## 2012-06-26 MED ORDER — ONDANSETRON HCL 4 MG PO TABS: 4.0000 mg | ORAL_TABLET | ORAL | Status: DC | PRN

## 2012-06-26 MED ORDER — DIPHENHYDRAMINE HCL 25 MG PO CAPS: 25.0000 mg | ORAL_CAPSULE | Freq: Four times a day (QID) | ORAL | Status: DC | PRN

## 2012-06-26 MED ORDER — LANOLIN HYDROUS EX OINT: TOPICAL_OINTMENT | CUTANEOUS | Status: DC | PRN

## 2012-06-26 MED ORDER — ONDANSETRON HCL 4 MG/2ML IJ SOLN: 4.0000 mg | INTRAMUSCULAR | Status: DC | PRN

## 2012-06-26 MED ORDER — FLEET ENEMA 7-19 GM/118ML RE ENEM: 1.0000 | ENEMA | Freq: Every day | RECTAL | Status: DC | PRN

## 2012-06-26 MED ORDER — TETANUS-DIPHTH-ACELL PERTUSSIS 5-2.5-18.5 LF-MCG/0.5 IM SUSP: 0.5000 mL | Freq: Once | INTRAMUSCULAR | Status: DC

## 2012-06-26 MED ORDER — OXYCODONE-ACETAMINOPHEN 5-325 MG PO TABS: 1.0000 | ORAL_TABLET | ORAL | Status: DC | PRN

## 2012-06-26 MED ORDER — BENZOCAINE-MENTHOL 20-0.5 % EX AERO: 1.0000 "application " | INHALATION_SPRAY | CUTANEOUS | Status: DC | PRN

## 2012-06-26 MED ORDER — PRENATAL MULTIVITAMIN CH
1.0000 | ORAL_TABLET | Freq: Every day | ORAL | Status: DC
  Administered 2012-06-26 – 2012-06-27 (×2): 1 via ORAL
  Filled 2012-06-26 (×2): qty 1

## 2012-06-26 MED ORDER — SODIUM CHLORIDE 0.9 % IV SOLN: 250.0000 mL | INTRAVENOUS | Status: DC | PRN

## 2012-06-26 MED ORDER — ZOLPIDEM TARTRATE 5 MG PO TABS: 5.0000 mg | ORAL_TABLET | Freq: Every evening | ORAL | Status: DC | PRN

## 2012-06-26 MED ORDER — IBUPROFEN 600 MG PO TABS
600.0000 mg | ORAL_TABLET | Freq: Four times a day (QID) | ORAL | Status: DC
  Administered 2012-06-26 – 2012-06-27 (×6): 600 mg via ORAL
  Filled 2012-06-26 (×6): qty 1

## 2012-06-26 MED ORDER — OXYTOCIN 40 UNITS IN LACTATED RINGERS INFUSION - SIMPLE MED: 62.5000 mL/h | INTRAVENOUS | Status: DC | PRN

## 2012-06-26 MED ORDER — SIMETHICONE 80 MG PO CHEW: 80.0000 mg | CHEWABLE_TABLET | ORAL | Status: DC | PRN

## 2012-06-26 NOTE — Progress Notes (Signed)
2nd RN at bedside

## 2012-06-26 NOTE — Progress Notes (Signed)
Post Partum Day 1 Subjective: no complaints, up ad lib, voiding and tolerating PO  Objective: Blood pressure 127/66, pulse 104, temperature 98.6 F (37 C), temperature source Oral, resp. rate 18, height 5\' 4"  (1.626 m), weight 88.451 kg (195 lb), last menstrual period 09/26/2011, SpO2 100.00%, unknown if currently breastfeeding.  Physical Exam:  General: alert, cooperative and no distress Lochia: appropriate Uterine Fundus: firm Incision: n/a DVT Evaluation: No evidence of DVT seen on physical exam. Negative Homan's sign. No cords or calf tenderness. No significant calf/ankle edema.   Basename 06/25/12 0820  HGB 10.3*  HCT 32.5*    Assessment/Plan: Plan for discharge tomorrow, Breastfeeding and Contraception nexplanon, undecided about inpatient vs. outpatient circumcision- selfpay   LOS: 1 day   Marge Duncans 06/26/2012, 7:17 AM

## 2012-06-27 ENCOUNTER — Other Ambulatory Visit: Payer: Self-pay

## 2012-06-27 LAB — CBC
HCT: 27.9 % — ABNORMAL LOW (ref 36.0–46.0)
Hemoglobin: 9.2 g/dL — ABNORMAL LOW (ref 12.0–15.0)
MCHC: 33 g/dL (ref 30.0–36.0)
MCV: 70.1 fL — ABNORMAL LOW (ref 78.0–100.0)
RDW: 16.6 % — ABNORMAL HIGH (ref 11.5–15.5)

## 2012-06-27 MED ORDER — SENNOSIDES-DOCUSATE SODIUM 8.6-50 MG PO TABS: 2.0000 | ORAL_TABLET | Freq: Every day | ORAL | Status: AC

## 2012-06-27 NOTE — Discharge Summary (Signed)
Obstetric Discharge Summary  Postpartum day 2  Reason for Admission: induction of labor for class A2 gestational DM Prenatal Procedures: none Intrapartum Procedures: spontaneous vaginal delivery and GBS prophylaxis, had a 30 second shoulder dystocia resolved with McRoberts and suprapubic pressure; glucostabilizer during labor. Postpartum Procedures: none Complications-Operative and Postpartum: none Hemoglobin  Date Value Range Status  06/27/2012 9.2* 12.0 - 15.0 g/dL Final     HCT  Date Value Range Status  06/27/2012 27.9* 36.0 - 46.0 % Final   BP 129/72  Pulse 84  Temp 98.3 F (36.8 C) (Oral)  Resp 18  Ht 5\' 4"  (1.626 m)  Wt 88.451 kg (195 lb)  BMI 33.47 kg/m2  SpO2 100%  LMP 09/26/2011  Breastfeeding? Unknown  Physical Exam:  General: alert, cooperative, appears stated age and no distress CV: 2+ bilateral DP pulses PULM: nl effort Lochia: appropriate Uterine Fundus: firm Incision: n/a DVT Evaluation: No evidence of DVT seen on physical exam. No significant calf/ankle edema.  Discharge Diagnoses: Term Pregnancy-delivered  Discharge Information: Date: 06/27/2012 Activity: pelvic rest Diet: routine Medications: PNV, Ibuprofen and Colace Condition: stable Instructions: refer to practice specific booklet Discharge to: home Mom planning to breast and bottle feed and get nexplanon in clinic  Newborn Data: Live born female  Birth Weight: 9 lb 2.7 oz (4159 g) APGAR: 8, 9 - Discharge instructions with information on outpatient circumcision locations Home with mother. - Possible fetal partial bladder outlet obstruction documented in H&P - recommend postnatal u/s  Simone Curia 06/27/2012, 7:38 AM  I examined pt and agree with documentation above and resident plan of care. [facility]

## 2012-06-27 NOTE — H&P (Signed)
Agree with above note.  Erika Potts 06/27/2012 2:21 PM

## 2012-06-27 NOTE — Progress Notes (Incomplete)
8/29 NST reviewed and reactive 

## 2012-06-27 NOTE — Progress Notes (Signed)
Ur chart review completed.  

## 2012-06-28 ENCOUNTER — Encounter: Payer: Self-pay | Admitting: Obstetrics & Gynecology

## 2012-06-30 ENCOUNTER — Other Ambulatory Visit: Payer: Self-pay

## 2012-06-30 NOTE — Progress Notes (Signed)
NST reactive on 06/17/12 

## 2012-07-25 ENCOUNTER — Ambulatory Visit: Payer: Self-pay | Admitting: Family Medicine

## 2013-03-15 IMAGING — US US OB FOLLOW-UP
2 series · 12 of 28 positions shown · non-contrast
Comparison: none

[Series 1: us ob follow up · 10 of 49 slices shown (1 of 2)]
[im 3/49]
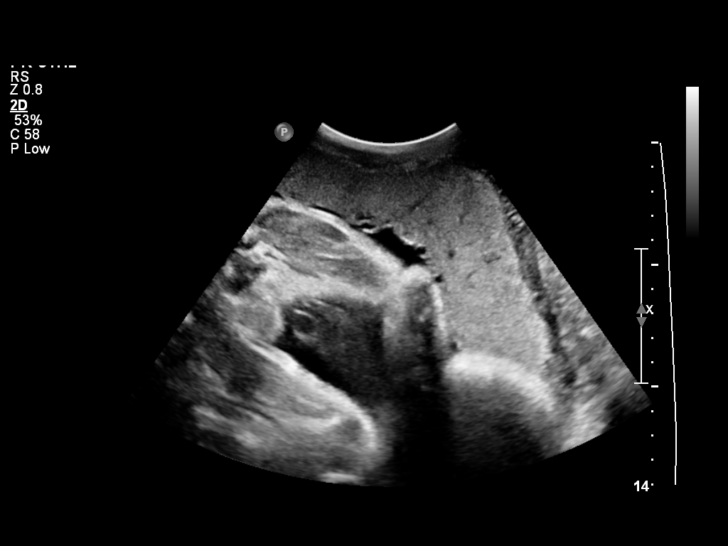
[im 7/49]
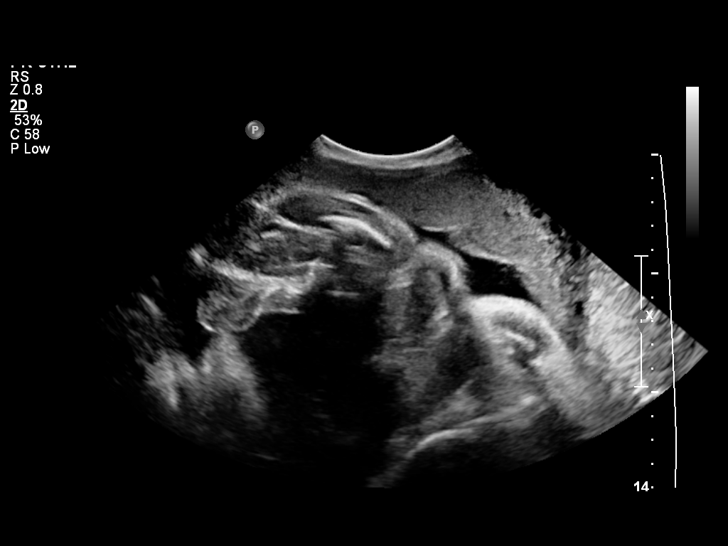
[im 11/49]
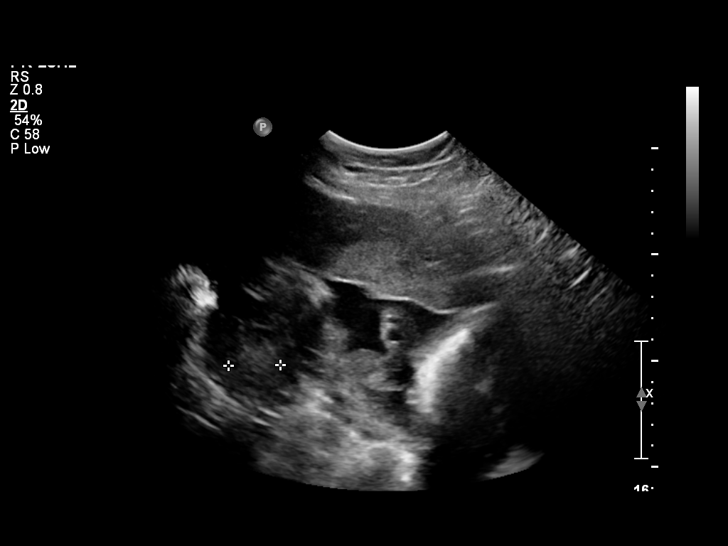
[im 18/49]
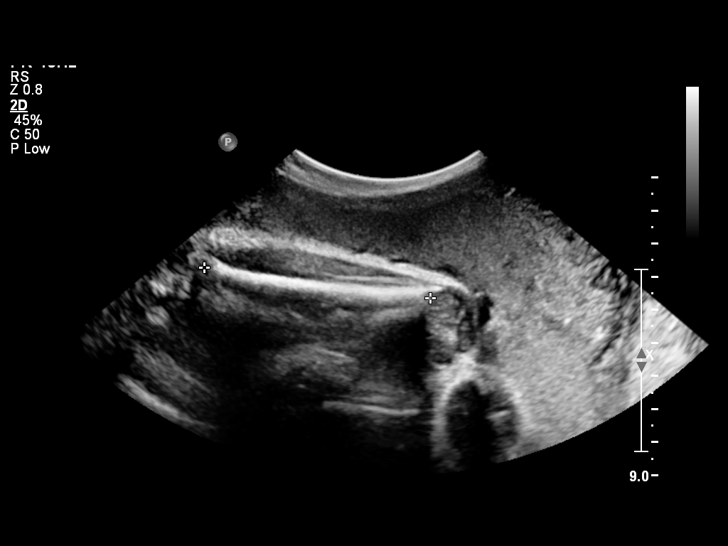
[im 22/49]
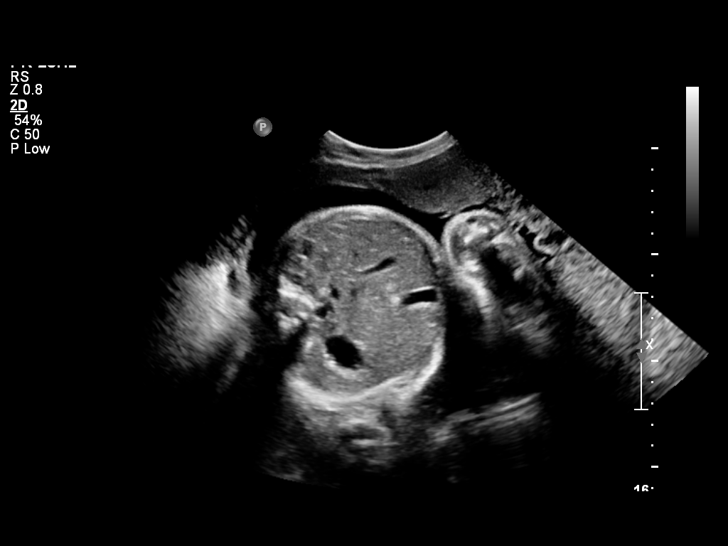
[im 27/49]
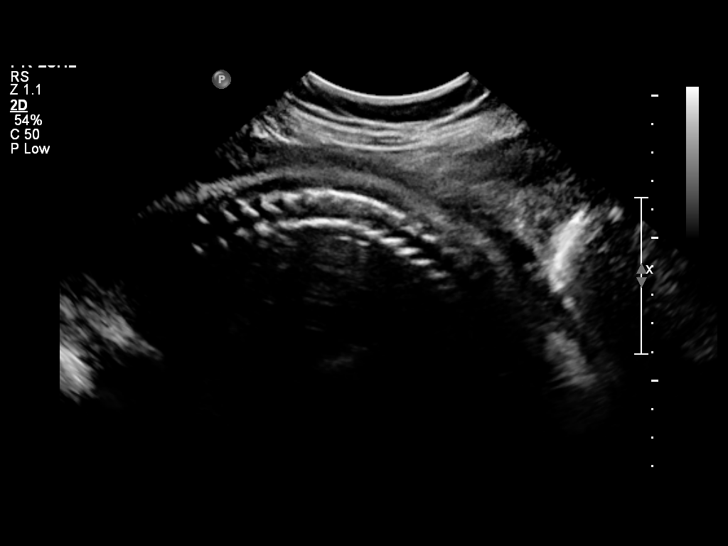
[im 33/49]
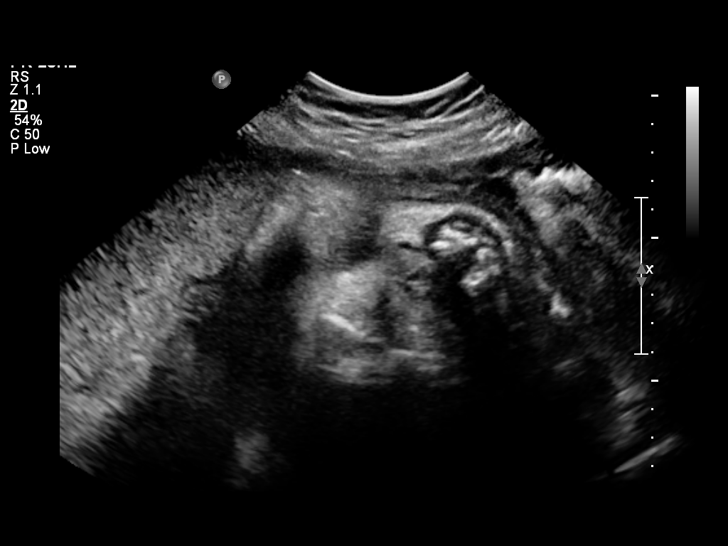
[im 38/49]
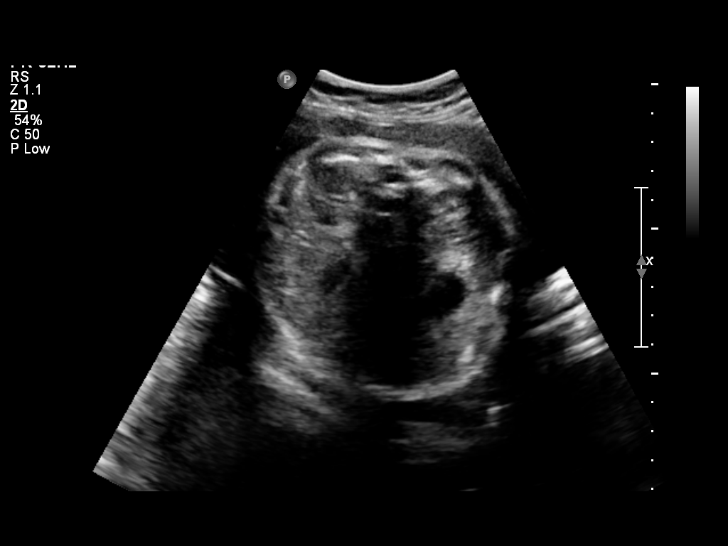
[im 42/49]
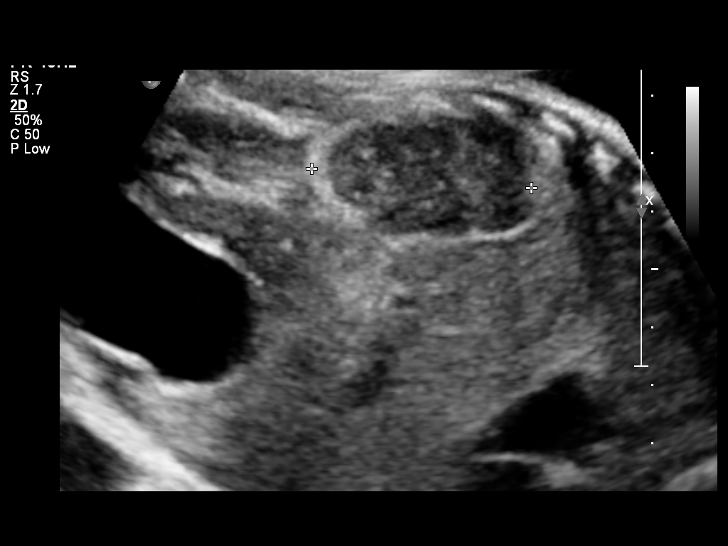
[im 49/49]
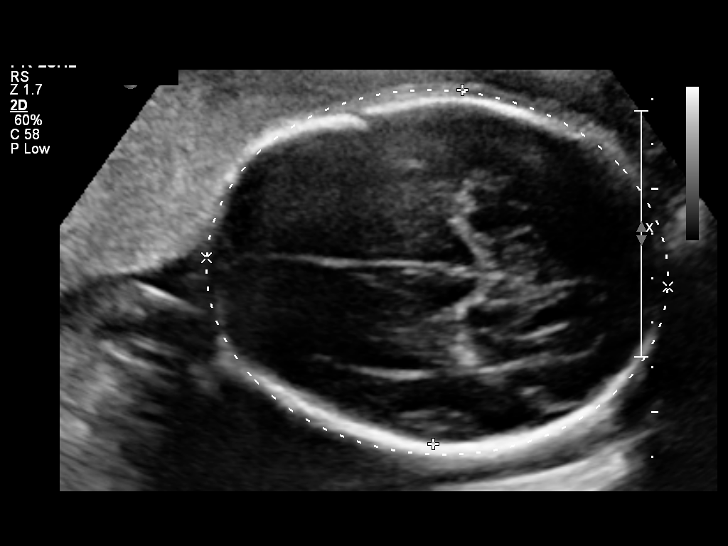

[Series 1: us ob follow up · 2 of 11 slices shown (2 of 2)]
[im 3/11]
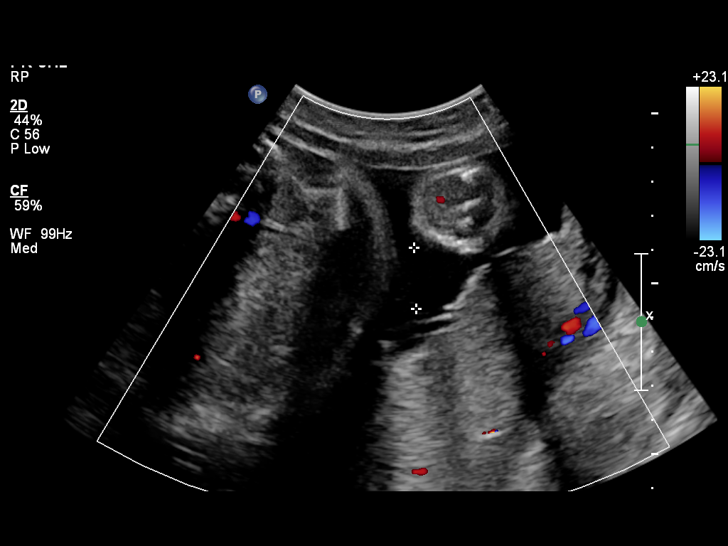
[im 8/11]
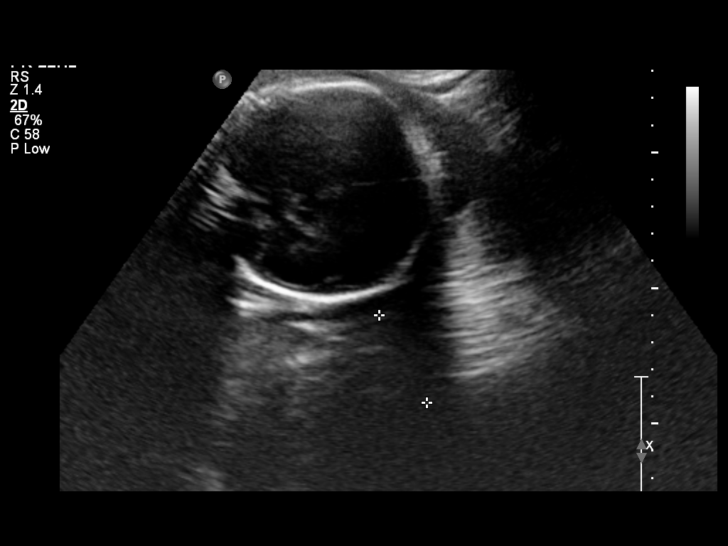

[12 of 28 positions shown; findings below may reference images not displayed]

OBSTETRICS REPORT
                      (Signed Final 04/27/2012 [DATE])

 Order#:         [id number]
Procedures

 US OB FOLLOW UP                                       76816.1
Indications

 Diabetes - Gestational, A1
 Assess Fetal Growth / Estimated Fetal Weight
Fetal Evaluation

 Fetal Heart Rate:  150                         bpm
 Cardiac Activity:  Observed
 Presentation:      Cephalic
 Placenta:          Anterior, above cervical os
 P. Cord            Visualized
 Insertion:

 Amniotic Fluid
 AFI FV:      Subjectively within normal limits
 AFI Sum:     12.13   cm      31   %Tile     Larg Pckt:   4.61   cm
 RUQ:   3.3    cm    RLQ:    1.79   cm    LUQ:   2.43    cm   LLQ:    4.61   cm
Biometry

 BPD:       79  mm    G. Age:   31w 5d                CI:         72.8   70 - 86
                                                      FL/HC:      23.7   19.3 -

 HC:     294.4  mm    G. Age:   32w 4d       69  %    HC/AC:      0.96   0.96 -

 AC:     305.2  mm    G. Age:   34w 4d     > 97  %    FL/BPD:     88.2   71 - 87
 FL:      69.7  mm    G. Age:   35w 5d     > 97  %    FL/AC:      22.8   20 - 24

 Est. FW:    6148  gm      5 lb 5 oz   > 90  %
Gestational Age

 Clinical EDD:  30w 4d                                        EDD:   07/02/12
 U/S Today:     33w 4d                                        EDD:   06/11/12
 Best:          30w 4d    Det. By:   Clinical EDD             EDD:   07/02/12
Anatomy

 Cranium:           Appears normal      Aortic Arch:       Not well
                                                           visualized
 Fetal Cavum:       Appears normal      Ductal Arch:       Previously seen
 Ventricles:        Appears normal      Diaphragm:         Appears normal
 Choroid Plexus:    Previously seen     Stomach:           Appears normal
 Cerebellum:        Previously seen     Abdomen:           Appears normal
 Posterior Fossa:   Previously seen     Abdominal Wall:    Previously seen
 Nuchal Fold:       Not applicable      Cord Vessels:      Previously seen
                    (>20 wks GA)
 Face:              Previously seen     Kidneys:           Appear normal
 Heart:             Previously seen     Bladder:           Appears normal
 RVOT:              Previously seen     Spine:             Appears normal
 LVOT:              Previously seen     Limbs:             Four extremities
                                                           previously seen

 Other:     Technically difficult due to fetal position. Male gender.
Targeted Anatomy

 Fetal Central Nervous System
 Lat. Ventricles:
Cervix Uterus Adnexa

 Cervical Length:   3.7       cm

 Cervix:       Normal appearance by transabdominal scan.

 Left Ovary:   Within normal limits.
 Right Ovary:  Within normal limits.
 Adnexa:     No abnormality visualized.
Impression

 Assigned GA is currently 30w 4d by clinical EDD.   Fetus is
 now measuring large-for-gestational-age (EFW of 2269gm
 >90 %ile).
 No late developing fetal abnormalities seen involving
 visualized anatomy.
 Amniotic fluid within normal limits, with AFI of 12.13 cm.
 Normal cervical length.

## 2013-07-20 IMAGING — US US OB FOLLOW-UP
2 series · 12 of 28 positions shown · non-contrast
Comparison: none

[Series 1: us ob follow up · 2 of 8 slices shown (1 of 2)]
[im 3/8]
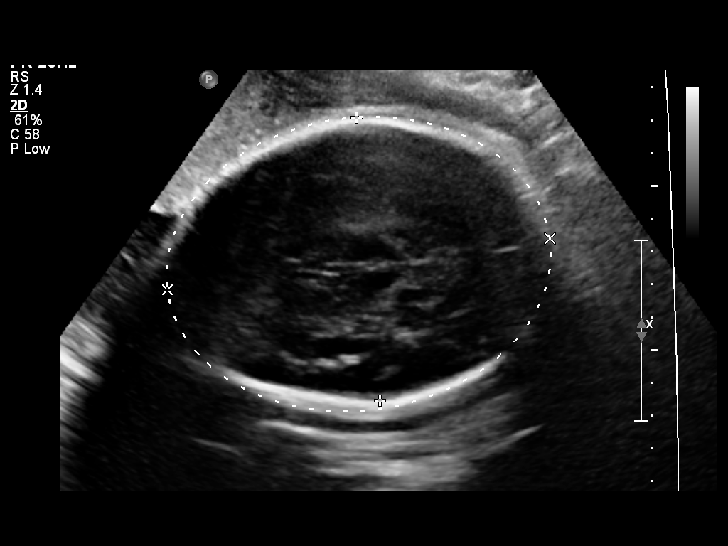
[im 8/8]
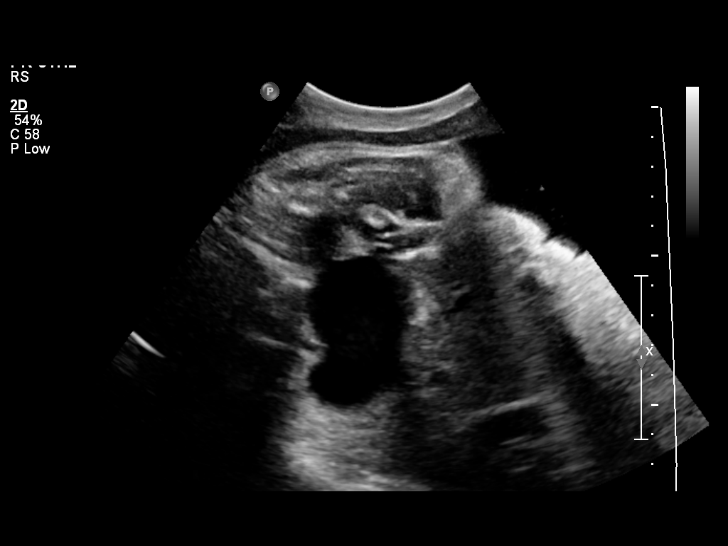

[Series 1: us ob follow up · 45 acquisitions, 10 frames shown (2 of 2)]
[im 2/45]
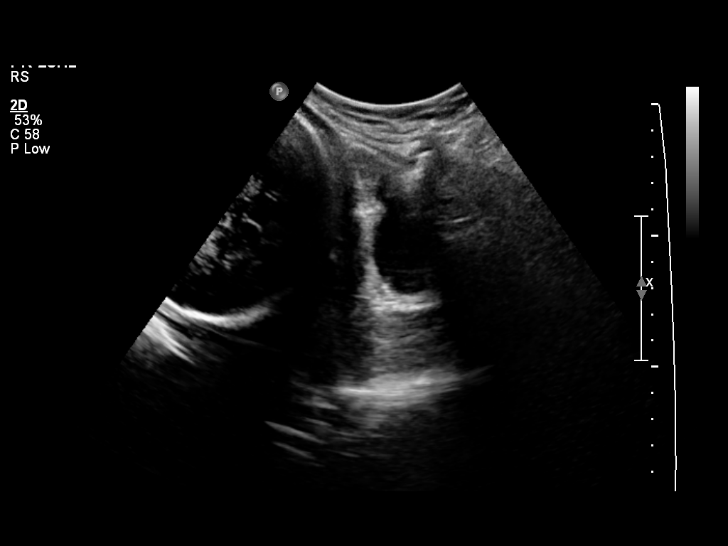
[im 8/45]
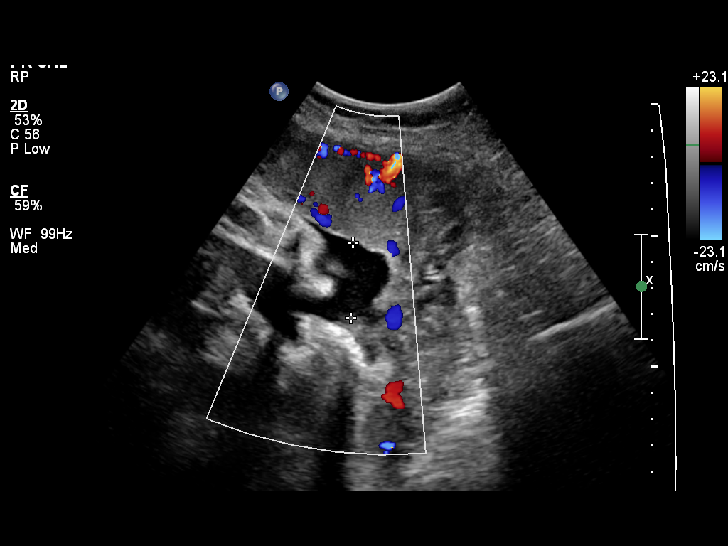
[im 12/45]
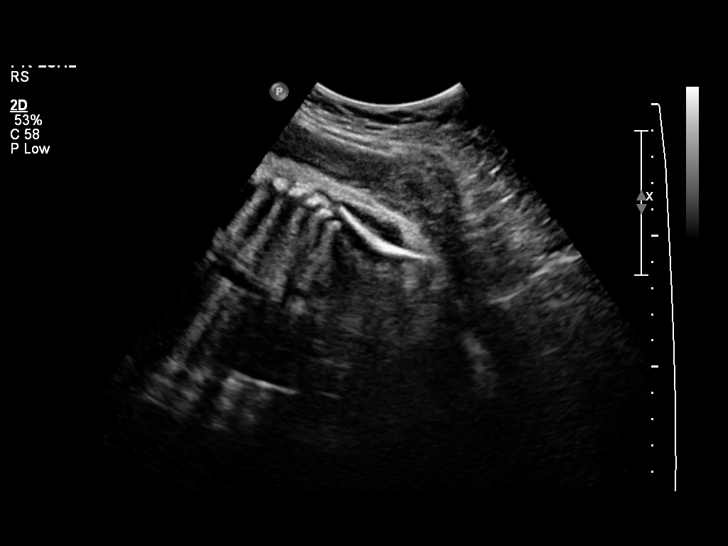
[im 16/45]
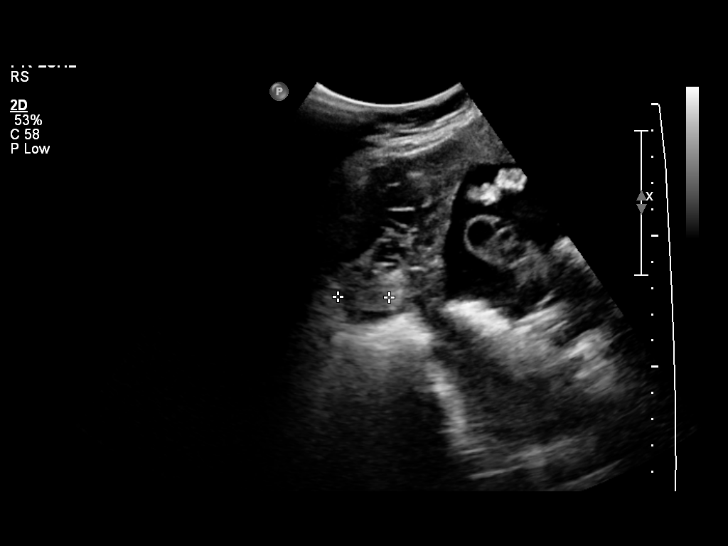
[im 22/45]
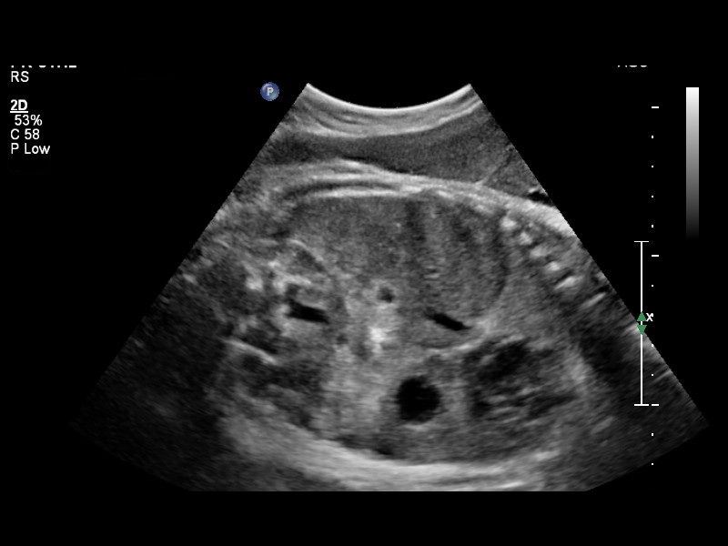
[im 25/45]
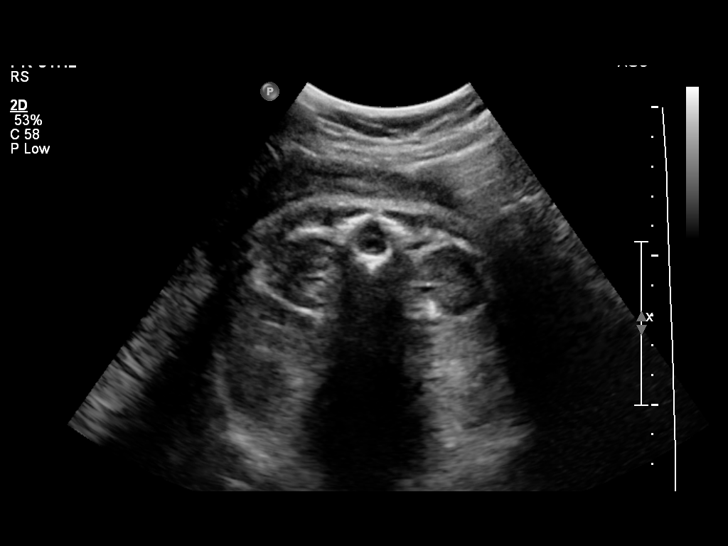
[im 29/45]
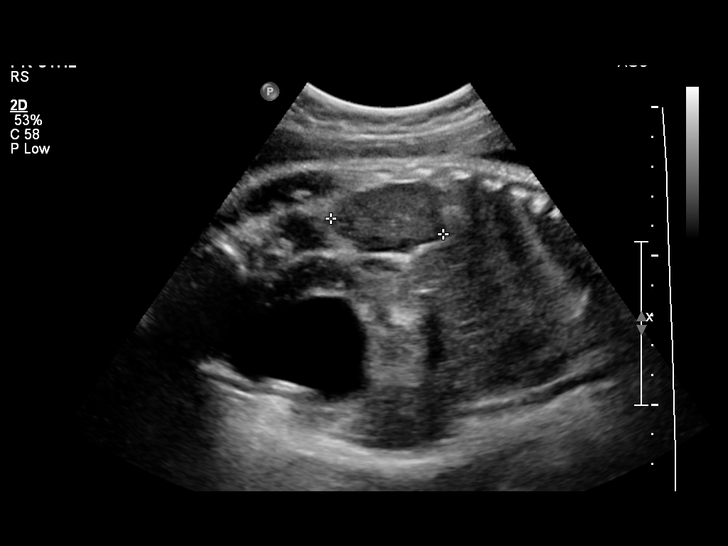
[im 35/45]
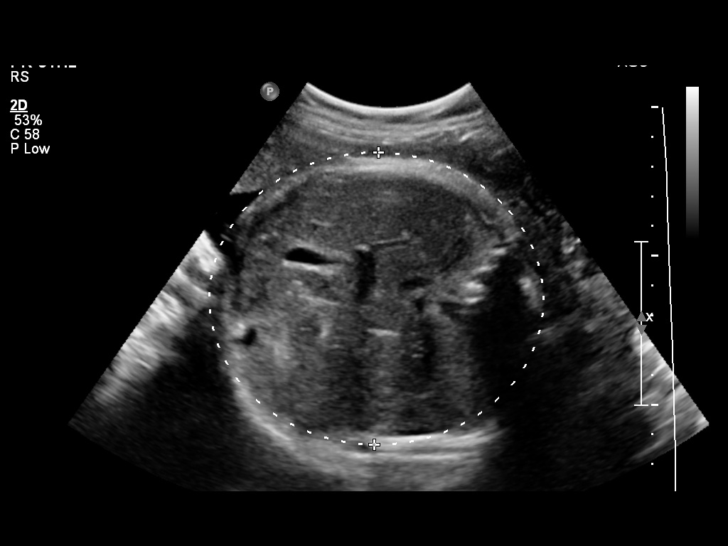
[im 39/45]
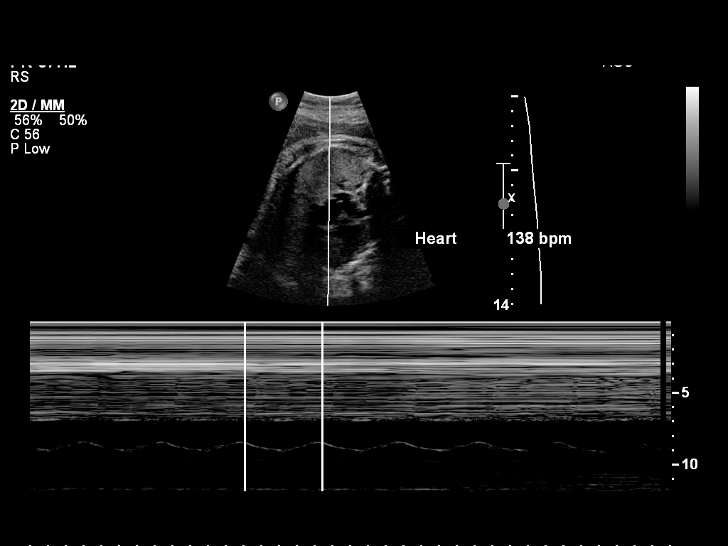
[im 43/45]
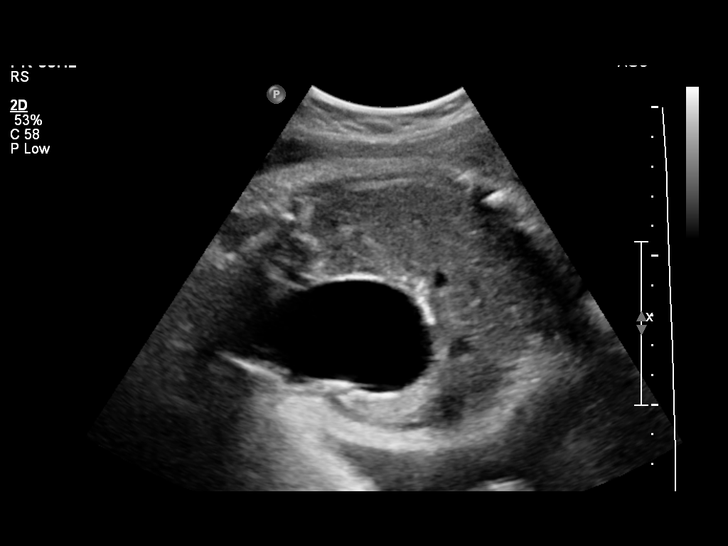

[12 of 28 positions shown; findings below may reference images not displayed]

OBSTETRICS REPORT
                      (Signed Final 05/26/2012 [DATE])

 Order#:         [id number]
Procedures

 US OB FOLLOW UP                                       76816.1
Indications

 Diabetes - Gestational
 Assess Fetal Growth / Estimated Fetal Weight
Fetal Evaluation

 Fetal Heart Rate:  138                         bpm
 Cardiac Activity:  Observed
 Presentation:      Cephalic
 Placenta:          Anterior, above cervical os
 P. Cord            Previously Visualized
 Insertion:

 Amniotic Fluid
 AFI FV:      Subjectively within normal limits
 AFI Sum:     11.13   cm      28   %Tile     Larg Pckt:   5.18   cm
 RUQ:   5.18   cm    RLQ:    2.03   cm    LUQ:   1.06    cm   LLQ:    2.86   cm
Biometry

 BPD:     86.4  mm    G. Age:   34w 6d                CI:        70.59   70 - 86
                                                      FL/HC:      22.8   20.1 -

 HC:     327.8  mm    G. Age:   37w 2d       77  %    HC/AC:      0.89   0.93 -

 AC:     367.6  mm    G. Age:   40w 5d     > 97  %    FL/BPD:     86.5   71 - 87
 FL:      74.7  mm    G. Age:   38w 2d     > 97  %    FL/AC:      20.3   20 - 24
 HUM:     60.9  mm    G. Age:   35w 2d       75  %

 Est. FW:    8644  gm      8 lb 1 oz   > 90  %
Gestational Age

 Clinical EDD:  34w 5d                                        EDD:   07/02/12
 U/S Today:     37w 6d                                        EDD:   06/10/12
 Best:          34w 5d    Det. By:   Clinical EDD             EDD:   07/02/12
Anatomy
 Cranium:           Appears normal      Aortic Arch:       Not well
                                                           visualized
 Fetal Cavum:       Previously seen     Ductal Arch:       Previously seen
 Ventricles:        Appears normal      Diaphragm:         Appears normal
 Choroid Plexus:    Previously seen     Stomach:           Appears normal
 Cerebellum:        Previously seen     Abdomen:           Appears normal
 Posterior Fossa:   Previously seen     Abdominal Wall:    Previously seen
 Nuchal Fold:       Not applicable      Cord Vessels:      Previously seen
                    (>20 wks GA)
 Face:              Previously seen     Kidneys:           Appear normal
 Heart:             Not well            Bladder:           Appears
                    visualized                             prominent
                    today. Seen
                    previously.
 RVOT:              Previously seen     Spine:             Previously seen
 LVOT:              Previously seen     Limbs:             Four extremities
                                                           previously seen

 Other:     Technically difficult due to fetal position. Male gender
            previously seen.
Cervix Uterus Adnexa

 Cervix:       Not visualized (advanced GA >34 wks)
 Left Ovary:   Not visualized.
 Right Ovary:  Within normal limits.

 Adnexa:     No abnormality visualized.
Comments

 EFW range (+ / - 15%) =  1747 - 4848 grams
Impression

 Single intrauterine gestation demonstrating an estimated
 gestational age by ultrasound of 37w 6d. This is correlated
 with expected estimated gestational age by clinical EDD of
 34w 5d. The AC is larger than the remaining gestational
 indicators resulting in an EFW > 90% and compatible with a
 LGA fetus. Close follow-up for growth is recommended to
 exclude developing fetal macrosomia.

 The bladder remained prominent in appearance during the
 course of the exam. The bladder outlet could not be imaged
 due to advanced gestational age and fetal position. The
 kidneys appeared normal and no ureterectasis is seen. The
 bladder wall did not appear trabeculated and this is likely due
 to a full bladder and not indicative of posterior urethral valves
 in this male fetus. This can be reassessed at the time of
 growth/weight reevaluation. No late developing fetal anatomic
 abnormalities are noted associated with the lateral ventricles,
 four chamber heart, stomach or kidneys.

 Subjectively and quantitatively normal amniotic fluid volume.

## 2014-08-13 ENCOUNTER — Encounter (HOSPITAL_COMMUNITY): Payer: Self-pay

## 2014-11-01 LAB — CYTOLOGY - PAP: PAP SMEAR: ABNORMAL — AB

## 2015-07-04 ENCOUNTER — Other Ambulatory Visit (HOSPITAL_COMMUNITY): Payer: Self-pay | Admitting: Nurse Practitioner

## 2015-07-04 DIAGNOSIS — Z3689 Encounter for other specified antenatal screening: Secondary | ICD-10-CM

## 2015-07-04 DIAGNOSIS — Z3A23 23 weeks gestation of pregnancy: Secondary | ICD-10-CM

## 2015-07-04 LAB — OB RESULTS CONSOLE HGB/HCT, BLOOD
HCT: 33 %
Hemoglobin: 11.1 g/dL

## 2015-07-04 LAB — OB RESULTS CONSOLE ABO/RH: RH TYPE: POSITIVE

## 2015-07-04 LAB — OB RESULTS CONSOLE RUBELLA ANTIBODY, IGM: RUBELLA: IMMUNE

## 2015-07-04 LAB — GLUCOSE TOLERANCE, 1 HOUR: GLUCOSE 1 HOUR GTT: 155

## 2015-07-04 LAB — OB RESULTS CONSOLE PLATELET COUNT: PLATELETS: 202 10*3/uL

## 2015-07-04 LAB — OB RESULTS CONSOLE HIV ANTIBODY (ROUTINE TESTING): HIV: NONREACTIVE

## 2015-07-04 LAB — OB RESULTS CONSOLE RPR: RPR: NONREACTIVE

## 2015-07-04 LAB — OB RESULTS CONSOLE ANTIBODY SCREEN: ANTIBODY SCREEN: NEGATIVE

## 2015-07-04 LAB — OB RESULTS CONSOLE GBS: GBS: POSITIVE

## 2015-07-04 LAB — CULTURE, OB URINE

## 2015-07-04 LAB — OB RESULTS CONSOLE GC/CHLAMYDIA
Chlamydia: NEGATIVE
Gonorrhea: NEGATIVE

## 2015-07-08 LAB — GLUCOSE TOLERANCE, 3 HOURS
Glucose, GTT - 1 Hour: 219 mg/dL — AB (ref ?–200)
Glucose, GTT - 2 Hour: 224 mg/dL — AB (ref ?–140)
Glucose, GTT - 3 Hour: 154 mg/dL — AB (ref ?–140)
Glucose, GTT - Fasting: 113 mg/dL — AB (ref 80–110)

## 2015-07-15 ENCOUNTER — Encounter: Payer: Self-pay | Admitting: Obstetrics & Gynecology

## 2015-07-15 ENCOUNTER — Encounter: Payer: Self-pay | Attending: Obstetrics and Gynecology | Admitting: *Deleted

## 2015-07-15 ENCOUNTER — Encounter: Payer: Self-pay | Admitting: *Deleted

## 2015-07-15 ENCOUNTER — Ambulatory Visit (INDEPENDENT_AMBULATORY_CARE_PROVIDER_SITE_OTHER): Payer: Self-pay | Admitting: Obstetrics & Gynecology

## 2015-07-15 VITALS — BP 109/41 | HR 98 | Temp 98.5°F | Wt 193.8 lb

## 2015-07-15 DIAGNOSIS — O99820 Streptococcus B carrier state complicating pregnancy: Secondary | ICD-10-CM

## 2015-07-15 DIAGNOSIS — Z23 Encounter for immunization: Secondary | ICD-10-CM

## 2015-07-15 DIAGNOSIS — O24419 Gestational diabetes mellitus in pregnancy, unspecified control: Secondary | ICD-10-CM | POA: Insufficient documentation

## 2015-07-15 DIAGNOSIS — Z22330 Carrier of Group B streptococcus: Secondary | ICD-10-CM

## 2015-07-15 DIAGNOSIS — O0993 Supervision of high risk pregnancy, unspecified, third trimester: Secondary | ICD-10-CM

## 2015-07-15 DIAGNOSIS — O0992 Supervision of high risk pregnancy, unspecified, second trimester: Secondary | ICD-10-CM

## 2015-07-15 DIAGNOSIS — Z713 Dietary counseling and surveillance: Secondary | ICD-10-CM | POA: Insufficient documentation

## 2015-07-15 HISTORY — DX: Gestational diabetes mellitus in pregnancy, unspecified control: O24.419

## 2015-07-15 LAB — POCT URINALYSIS DIP (DEVICE)
BILIRUBIN URINE: NEGATIVE
Glucose, UA: NEGATIVE mg/dL
HGB URINE DIPSTICK: NEGATIVE
KETONES UR: NEGATIVE mg/dL
Leukocytes, UA: NEGATIVE
Nitrite: NEGATIVE
PH: 6.5 (ref 5.0–8.0)
Protein, ur: NEGATIVE mg/dL
SPECIFIC GRAVITY, URINE: 1.02 (ref 1.005–1.030)
Urobilinogen, UA: 0.2 mg/dL (ref 0.0–1.0)

## 2015-07-15 LAB — GLUCOSE, CAPILLARY: GLUCOSE-CAPILLARY: 114 mg/dL — AB (ref 65–99)

## 2015-07-15 LAB — HEMOGLOBIN A1C
Hgb A1c MFr Bld: 5.6 % (ref <5.7)
MEAN PLASMA GLUCOSE: 114 mg/dL (ref <117)

## 2015-07-15 LAB — TSH: TSH: 0.805 u[IU]/mL (ref 0.350–4.500)

## 2015-07-15 NOTE — Progress Notes (Signed)
Subjective:  Erika Potts is a 34 y.o. [id number] at [redacted]w[redacted]d being seen today for ongoing prenatal care.  Patient reports no complaints.  Contractions: Irritability.  Vag. Bleeding: None. Movement: Present. Denies leaking of fluid.   The following portions of the patient's history were reviewed and updated as appropriate: allergies, current medications, past family history, past medical history, past social history, past surgical history and problem list.   Objective:   Filed Vitals:   07/15/15 0909  BP: 109/41  Pulse: 98  Temp: 98.5 F (36.9 C)  Weight: 193 lb 12.8 oz (87.907 kg)    Fetal Status: Fetal Heart Rate (bpm): 143 Fundal Height: 25 cm Movement: Present     General:  Alert, oriented and cooperative. Patient is in no acute distress.  Skin: Skin is warm and dry. No rash noted.   Cardiovascular: Normal heart rate noted  Respiratory: Normal respiratory effort, no problems with respiration noted  Abdomen: Soft, gravid, appropriate for gestational age. Pain/Pressure: Present     Pelvic: Vag. Bleeding: None     Cervical exam deferred        Extremities: Normal range of motion.  Edema: None  Mental Status: Normal mood and affect. Normal behavior. Normal judgment and thought content.   Urinalysis: Urine Protein: Negative Urine Glucose: Negative  Assessment and Plan:  Pregnancy: G5P4004 at [redacted]w[redacted]d  1. Supervision of high-risk pregnancy, second trimester -[place] for anatomy and confirm dating is tomorrow. -Genetic testing--pt unsure about quad screen.  Wants to talk with husband.  Pt understands she as end of period when test can be done.  She is OK with no testing.  She did not get it with her other pregnancies.   - Flu Vaccine QUAD 36+ mos IM; Standing - Flu Vaccine QUAD 36+ mos IM  2. 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  Will treat in labor.  3. Gestational diabetes mellitus (GDM) in second trimester,  gestational diabetes method of control unspecified 2 hr pp today (breakfast chicken and eggs)--117. Nutrition and DM teaching Pt did not go for pp GTT after last pregnancy Will see what Hgb A1C is to see if she needs to treated as a DM type 2.   Preterm labor symptoms and general obstetric precautions including but not limited to vaginal bleeding, contractions, leaking of fluid and fetal movement were reviewed in detail with the patient. Please refer to After Visit Summary for other counseling recommendations.  Return in about 1 week (around 07/22/2015).   Lesly Dukes, MD

## 2015-07-15 NOTE — Progress Notes (Signed)
Hep b & hiv results not yet available from [facility]- need to acquire at later date

## 2015-07-15 NOTE — Progress Notes (Signed)
  Patient was seen on 07/15/15 for Gestational Diabetes self-management . The following learning objectives were met by the patient :   States the definition of Gestational Diabetes  States why dietary management is important in controlling blood glucose  States when to check blood glucose levels  Demonstrates proper blood glucose monitoring techniques  States the effect of stress and exercise on blood glucose levels  States the importance of limiting caffeine and abstaining from alcohol and smoking  Plan:  Consider  increasing your activity level by walking daily as tolerated Begin checking BG before breakfast and 1-2 hours after first bit of breakfast, lunch and dinner after  as directed by MD  Take medication  as directed by MD  Blood glucose monitor given: [employer] Lot # [id number] Exp: 06/11/2017 Blood glucose reading: 2hpp 132m/dl  Patient instructed to monitor glucose levels: FBS: 60 - <90 2 hour: <120  Patient received the following handouts:  Nutrition Diabetes and Pregnancy  Carbohydrate Counting List  Meal Planning worksheet  Patient will be seen for follow-up as needed.

## 2015-07-15 NOTE — Progress Notes (Signed)
Nutrition Note: GDM diet education Pt with h/o GDM with two prior pregnancies. Pt has lost weight during pregnancy. Pt reports eating 3 meals and no snacks daily.  Pt is taking PNV. Pt reports no N&V or heartburn. NKFA.  Pt received verbal and written education on GDM diet.  Pt encouraged to eat every 3-4 hours.  Discussed weight gain goals of 11-20 # for pregnancy. Pt agrees to continue to take PNV. Pt plans to BF.  Pt has WIC. F/u in 4-6 weeks. Carloyn Manner, MS, RD, LDN

## 2015-07-16 ENCOUNTER — Ambulatory Visit (HOSPITAL_COMMUNITY)
Admission: RE | Admit: 2015-07-16 | Discharge: 2015-07-16 | Disposition: A | Discharge status: Home / Self Care | Payer: Self-pay | Source: Ambulatory Visit | Attending: Nurse Practitioner | Admitting: Nurse Practitioner

## 2015-07-16 DIAGNOSIS — Z3A23 23 weeks gestation of pregnancy: Secondary | ICD-10-CM | POA: Insufficient documentation

## 2015-07-16 DIAGNOSIS — Z36 Encounter for antenatal screening of mother: Secondary | ICD-10-CM | POA: Insufficient documentation

## 2015-07-16 DIAGNOSIS — Z3689 Encounter for other specified antenatal screening: Secondary | ICD-10-CM

## 2015-07-22 ENCOUNTER — Encounter: Payer: Self-pay | Admitting: Advanced Practice Midwife

## 2015-07-23 ENCOUNTER — Encounter: Payer: Self-pay | Admitting: *Deleted

## 2015-07-29 ENCOUNTER — Ambulatory Visit (INDEPENDENT_AMBULATORY_CARE_PROVIDER_SITE_OTHER): Payer: Self-pay | Admitting: Obstetrics and Gynecology

## 2015-07-29 VITALS — BP 116/60 | HR 94 | Temp 98.7°F | Wt 190.3 lb

## 2015-07-29 DIAGNOSIS — O0993 Supervision of high risk pregnancy, unspecified, third trimester: Secondary | ICD-10-CM

## 2015-07-29 DIAGNOSIS — O24419 Gestational diabetes mellitus in pregnancy, unspecified control: Secondary | ICD-10-CM

## 2015-07-29 LAB — POCT URINALYSIS DIP (DEVICE)
BILIRUBIN URINE: NEGATIVE
Glucose, UA: NEGATIVE mg/dL
KETONES UR: 40 mg/dL — AB
LEUKOCYTES UA: NEGATIVE
Nitrite: NEGATIVE
PH: 6.5 (ref 5.0–8.0)
Protein, ur: NEGATIVE mg/dL
Specific Gravity, Urine: 1.02 (ref 1.005–1.030)
Urobilinogen, UA: 1 mg/dL (ref 0.0–1.0)

## 2015-07-29 MED ORDER — GLYBURIDE 2.5 MG PO TABS: 1.2500 mg | ORAL_TABLET | Freq: Every day | ORAL | Status: DC

## 2015-07-29 NOTE — Progress Notes (Signed)
Subjective:  Erika Potts is a 34 y.o. [id number] at 40w6dbeing seen today for ongoing prenatal care.  Patient reports no complaints. GDM dx last week, met w/ diabetes educator and nutritionist. 8 of 12 fastings are > 95. Of 23 PPs, 9 are greater than 120.  Contractions: Irregular.  Vag. Bleeding: None. Movement: Present. Denies leaking of fluid.   The following portions of the patient's history were reviewed and updated as appropriate: allergies, current medications, past family history, past medical history, past social history, past surgical history and problem list. Problem list updated.  Objective:   Filed Vitals:   07/29/15 1048  BP: 116/60  Pulse: 94  Temp: 98.7 F (37.1 C)  Weight: 190 lb 4.8 oz (86.32 kg)    Fetal Status: Fetal Heart Rate (bpm): 150   Movement: Present     General:  Alert, oriented and cooperative. Patient is in no acute distress.  Skin: Skin is warm and dry. No rash noted.   Cardiovascular: Normal heart rate noted  Respiratory: Normal respiratory effort, no problems with respiration noted  Abdomen: Soft, gravid, appropriate for gestational age. Pain/Pressure: Present     Pelvic: Vag. Bleeding: None     Cervical exam deferred        Extremities: Normal range of motion.  Edema: None  Mental Status: Normal mood and affect. Normal behavior. Normal judgment and thought content.   Urinalysis: Urine Protein: Negative Urine Glucose: Negative  Assessment and Plan:  Pregnancy: [id number] 238w6d# GDM - fastings elevated. Met with diabetes educator today and she made dietary recommendations. I reinforced exercise. Starting glyburide 1.25 mg po qhs. F/u diabetes educator 1 week, MD 2 wks  Preterm labor symptoms and general obstetric precautions including but not limited to vaginal bleeding, contractions, leaking of fluid and fetal movement were reviewed in detail with the patient. Please refer to After Visit Summary for other counseling recommendations.   Return in about 2 weeks (around 08/12/2015).   NoGwynne EdingerMD

## 2015-08-03 ENCOUNTER — Encounter: Payer: Self-pay | Admitting: *Deleted

## 2015-08-03 DIAGNOSIS — O0993 Supervision of high risk pregnancy, unspecified, third trimester: Secondary | ICD-10-CM

## 2015-08-05 ENCOUNTER — Ambulatory Visit: Payer: Self-pay

## 2015-08-12 ENCOUNTER — Encounter: Payer: Self-pay | Admitting: Family Medicine

## 2015-08-19 ENCOUNTER — Encounter: Payer: Self-pay | Admitting: Family Medicine

## 2015-08-19 ENCOUNTER — Ambulatory Visit (INDEPENDENT_AMBULATORY_CARE_PROVIDER_SITE_OTHER): Payer: Self-pay | Admitting: Family Medicine

## 2015-08-19 VITALS — BP 108/63 | HR 101 | Temp 98.7°F | Wt 191.1 lb

## 2015-08-19 DIAGNOSIS — O0993 Supervision of high risk pregnancy, unspecified, third trimester: Secondary | ICD-10-CM

## 2015-08-19 DIAGNOSIS — O24414 Gestational diabetes mellitus in pregnancy, insulin controlled: Secondary | ICD-10-CM

## 2015-08-19 LAB — CBC
HCT: 32.6 % — ABNORMAL LOW (ref 36.0–46.0)
HEMOGLOBIN: 10.9 g/dL — AB (ref 12.0–15.0)
MCH: 26.1 pg (ref 26.0–34.0)
MCHC: 33.4 g/dL (ref 30.0–36.0)
MCV: 78 fL (ref 78.0–100.0)
MPV: 10.9 fL (ref 8.6–12.4)
Platelets: 218 10*3/uL (ref 150–400)
RBC: 4.18 MIL/uL (ref 3.87–5.11)
RDW: 14.5 % (ref 11.5–15.5)
WBC: 7.8 10*3/uL (ref 4.0–10.5)

## 2015-08-19 LAB — POCT URINALYSIS DIP (DEVICE)
BILIRUBIN URINE: NEGATIVE
Glucose, UA: 100 mg/dL — AB
HGB URINE DIPSTICK: NEGATIVE
Ketones, ur: 15 mg/dL — AB
Nitrite: NEGATIVE
Protein, ur: NEGATIVE mg/dL
SPECIFIC GRAVITY, URINE: 1.025 (ref 1.005–1.030)
UROBILINOGEN UA: 1 mg/dL (ref 0.0–1.0)
pH: 6 (ref 5.0–8.0)

## 2015-08-19 MED ORDER — TETANUS-DIPHTH-ACELL PERTUSSIS 5-2.5-18.5 LF-MCG/0.5 IM SUSP
0.5000 mL | Freq: Once | INTRAMUSCULAR | Status: AC
  Administered 2015-08-19: 0.5 mL via INTRAMUSCULAR

## 2015-08-19 MED ORDER — GLYBURIDE 2.5 MG PO TABS: 2.5000 mg | ORAL_TABLET | Freq: Two times a day (BID) | ORAL | Status: DC

## 2015-08-19 NOTE — Patient Instructions (Signed)
Third Trimester of Pregnancy The third trimester is from week 29 through week 42, months 7 through 9. The third trimester is a time when the fetus is growing rapidly. At the end of the ninth month, the fetus is about 20 inches in length and weighs 6-10 pounds.  BODY CHANGES Your body goes through many changes during pregnancy. The changes vary from woman to woman.   Your weight will continue to increase. You can expect to gain 25-35 pounds (11-16 kg) by the end of the pregnancy.  You may begin to get stretch marks on your hips, abdomen, and breasts.  You may urinate more often because the fetus is moving lower into your pelvis and pressing on your bladder.  You may develop or continue to have heartburn as a result of your pregnancy.  You may develop constipation because certain hormones are causing the muscles that push waste through your intestines to slow down.  You may develop hemorrhoids or swollen, bulging veins (varicose veins).  You may have pelvic pain because of the weight gain and pregnancy hormones relaxing your joints between the bones in your pelvis. Backaches may result from overexertion of the muscles supporting your posture.  You may have changes in your hair. These can include thickening of your hair, rapid growth, and changes in texture. Some women also have hair loss during or after pregnancy, or hair that feels dry or thin. Your hair will most likely return to normal after your baby is born.  Your breasts will continue to grow and be tender. A yellow discharge may leak from your breasts called colostrum.  Your belly button may stick out.  You may feel short of breath because of your expanding uterus.  You may notice the fetus "dropping," or moving lower in your abdomen.  You may have a bloody mucus discharge. This usually occurs a few days to a week before labor begins.  Your cervix becomes thin and soft (effaced) near your due date. WHAT TO EXPECT AT YOUR  PRENATAL EXAMS  You will have prenatal exams every 2 weeks until week 36. Then, you will have weekly prenatal exams. During a routine prenatal visit:  You will be weighed to make sure you and the fetus are growing normally.  Your blood pressure is taken.  Your abdomen will be measured to track your baby's growth.  The fetal heartbeat will be listened to.  Any test results from the previous visit will be discussed.  You may have a cervical check near your due date to see if you have effaced. At around 36 weeks, your caregiver will check your cervix. At the same time, your caregiver will also perform a test on the secretions of the vaginal tissue. This test is to determine if a type of bacteria, Group B streptococcus, is present. Your caregiver will explain this further. Your caregiver may ask you:  What your birth plan is.  How you are feeling.  If you are feeling the baby move.  If you have had any abnormal symptoms, such as leaking fluid, bleeding, severe headaches, or abdominal cramping.  If you are using any tobacco products, including cigarettes, chewing tobacco, and electronic cigarettes.  If you have any questions. Other tests or screenings that may be performed during your third trimester include:  Blood tests that check for low iron levels (anemia).  Fetal testing to check the health, activity level, and growth of the fetus. Testing is done if you have certain medical conditions or if   there are problems during the pregnancy.  HIV (human immunodeficiency virus) testing. If you are at high risk, you may be screened for HIV during your third trimester of pregnancy. FALSE LABOR You may feel small, irregular contractions that eventually go away. These are called Braxton Hicks contractions, or false labor. Contractions may last for hours, days, or even weeks before true labor sets in. If contractions come at regular intervals, intensify, or become painful, it is best to be seen  by your caregiver.  SIGNS OF LABOR   Menstrual-like cramps.  Contractions that are 5 minutes apart or less.  Contractions that start on the top of the uterus and spread down to the lower abdomen and back.  A sense of increased pelvic pressure or back pain.  A watery or bloody mucus discharge that comes from the vagina. If you have any of these signs before the 37th week of pregnancy, call your caregiver right away. You need to go to the hospital to get checked immediately. HOME CARE INSTRUCTIONS   Avoid all smoking, herbs, alcohol, and unprescribed drugs. These chemicals affect the formation and growth of the baby.  Do not use any tobacco products, including cigarettes, chewing tobacco, and electronic cigarettes. If you need help quitting, ask your health care provider. You may receive counseling support and other resources to help you quit.  Follow your caregiver's instructions regarding medicine use. There are medicines that are either safe or unsafe to take during pregnancy.  Exercise only as directed by your caregiver. Experiencing uterine cramps is a good sign to stop exercising.  Continue to eat regular, healthy meals.  Wear a good support bra for breast tenderness.  Do not use hot tubs, steam rooms, or saunas.  Wear your seat belt at all times when driving.  Avoid raw meat, uncooked cheese, cat litter boxes, and soil used by cats. These carry germs that can cause birth defects in the baby.  Take your prenatal vitamins.  Take 1500-2000 mg of calcium daily starting at the 20th week of pregnancy until you deliver your baby.  Try taking a stool softener (if your caregiver approves) if you develop constipation. Eat more high-fiber foods, such as fresh vegetables or fruit and whole grains. Drink plenty of fluids to keep your urine clear or pale yellow.  Take warm sitz baths to soothe any pain or discomfort caused by hemorrhoids. Use hemorrhoid cream if your caregiver  approves.  If you develop varicose veins, wear support hose. Elevate your feet for 15 minutes, 3-4 times a day. Limit salt in your diet.  Avoid heavy lifting, wear low heal shoes, and practice good posture.  Rest a lot with your legs elevated if you have leg cramps or low back pain.  Visit your dentist if you have not gone during your pregnancy. Use a soft toothbrush to brush your teeth and be gentle when you floss.  A sexual relationship may be continued unless your caregiver directs you otherwise.  Do not travel far distances unless it is absolutely necessary and only with the approval of your caregiver.  Take prenatal classes to understand, practice, and ask questions about the labor and delivery.  Make a trial run to the hospital.  Pack your hospital bag.  Prepare the baby's nursery.  Continue to go to all your prenatal visits as directed by your caregiver. SEEK MEDICAL CARE IF:  You are unsure if you are in labor or if your water has broken.  You have dizziness.  You have   mild pelvic cramps, pelvic pressure, or nagging pain in your abdominal area.  You have persistent nausea, vomiting, or diarrhea.  You have a bad smelling vaginal discharge.  You have pain with urination. SEEK IMMEDIATE MEDICAL CARE IF:   You have a fever.  You are leaking fluid from your vagina.  You have spotting or bleeding from your vagina.  You have severe abdominal cramping or pain.  You have rapid weight loss or gain.  You have shortness of breath with chest pain.  You notice sudden or extreme swelling of your face, hands, ankles, feet, or legs.  You have not felt your baby move in over an hour.  You have severe headaches that do not go away with medicine.  You have vision changes.   This information is not intended to replace advice given to you by your health care provider. Make sure you discuss any questions you have with your health care provider.   Document Released:  09/22/2001 Document Revised: 10/19/2014 Document Reviewed: 11/29/2012 Elsevier Interactive Patient Education 2016 [employer]  Breastfeeding Deciding to breastfeed is one of the best choices you can make for you and your baby. A change in hormones during pregnancy causes your breast tissue to grow and increases the number and size of your milk ducts. These hormones also allow proteins, sugars, and fats from your blood supply to make breast milk in your milk-producing glands. Hormones prevent breast milk from being released before your baby is born as well as prompt milk flow after birth. Once breastfeeding has begun, thoughts of your baby, as well as his or her sucking or crying, can stimulate the release of milk from your milk-producing glands.  BENEFITS OF BREASTFEEDING For Your Baby  Your first milk (colostrum) helps your baby's digestive system function better.  There are antibodies in your milk that help your baby fight off infections.  Your baby has a lower incidence of asthma, allergies, and sudden infant death syndrome.  The nutrients in breast milk are better for your baby than infant formulas and are designed uniquely for your baby's needs.  Breast milk improves your baby's brain development.  Your baby is less likely to develop other conditions, such as childhood obesity, asthma, or type 2 diabetes mellitus. For You  Breastfeeding helps to create a very special bond between you and your baby.  Breastfeeding is convenient. Breast milk is always available at the correct temperature and costs nothing.  Breastfeeding helps to burn calories and helps you lose the weight gained during pregnancy.  Breastfeeding makes your uterus contract to its prepregnancy size faster and slows bleeding (lochia) after you give birth.   Breastfeeding helps to lower your risk of developing type 2 diabetes mellitus, osteoporosis, and breast or ovarian cancer later in life. SIGNS THAT YOUR BABY IS  HUNGRY Early Signs of Hunger  Increased alertness or activity.  Stretching.  Movement of the head from side to side.  Movement of the head and opening of the mouth when the corner of the mouth or cheek is stroked (rooting).  Increased sucking sounds, smacking lips, cooing, sighing, or squeaking.  Hand-to-mouth movements.  Increased sucking of fingers or hands. Late Signs of Hunger  Fussing.  Intermittent crying. Extreme Signs of Hunger Signs of extreme hunger will require calming and consoling before your baby will be able to breastfeed successfully. Do not wait for the following signs of extreme hunger to occur before you initiate breastfeeding:  Restlessness.  A loud, strong cry.  Screaming.   BREASTFEEDING BASICS Breastfeeding Initiation  Find a comfortable place to sit or lie down, with your neck and back well supported.  Place a pillow or rolled up blanket under your baby to bring him or her to the level of your breast (if you are seated). Nursing pillows are specially designed to help support your arms and your baby while you breastfeed.  Make sure that your baby's abdomen is facing your abdomen.  Gently massage your breast. With your fingertips, massage from your chest wall toward your nipple in a circular motion. This encourages milk flow. You may need to continue this action during the feeding if your milk flows slowly.  Support your breast with 4 fingers underneath and your thumb above your nipple. Make sure your fingers are well away from your nipple and your baby's mouth.  Stroke your baby's lips gently with your finger or nipple.  When your baby's mouth is open wide enough, quickly bring your baby to your breast, placing your entire nipple and as much of the colored area around your nipple (areola) as possible into your baby's mouth.  More areola should be visible above your baby's upper lip than below the lower lip.  Your baby's tongue should be between his  or her lower gum and your breast.  Ensure that your baby's mouth is correctly positioned around your nipple (latched). Your baby's lips should create a seal on your breast and be turned out (everted).  It is common for your baby to suck about 2-3 minutes in order to start the flow of breast milk. Latching Teaching your baby how to latch on to your breast properly is very important. An improper latch can cause nipple pain and decreased milk supply for you and poor weight gain in your baby. Also, if your baby is not latched onto your nipple properly, he or she may swallow some air during feeding. This can make your baby fussy. Burping your baby when you switch breasts during the feeding can help to get rid of the air. However, teaching your baby to latch on properly is still the best way to prevent fussiness from swallowing air while breastfeeding. Signs that your baby has successfully latched on to your nipple:  Silent tugging or silent sucking, without causing you pain.  Swallowing heard between every 3-4 sucks.  Muscle movement above and in front of his or her ears while sucking. Signs that your baby has not successfully latched on to nipple:  Sucking sounds or smacking sounds from your baby while breastfeeding.  Nipple pain. If you think your baby has not latched on correctly, slip your finger into the corner of your baby's mouth to break the suction and place it between your baby's gums. Attempt breastfeeding initiation again. Signs of Successful Breastfeeding Signs from your baby:  A gradual decrease in the number of sucks or complete cessation of sucking.  Falling asleep.  Relaxation of his or her body.  Retention of a small amount of milk in his or her mouth.  Letting go of your breast by himself or herself. Signs from you:  Breasts that have increased in firmness, weight, and size 1-3 hours after feeding.  Breasts that are softer immediately after  breastfeeding.  Increased milk volume, as well as a change in milk consistency and color by the fifth day of breastfeeding.  Nipples that are not sore, cracked, or bleeding. Signs That Your Baby is Getting Enough Milk  Wetting at least 3 diapers in a 24-hour period.   The urine should be clear and pale yellow by age 5 days.  At least 3 stools in a 24-hour period by age 5 days. The stool should be soft and yellow.  At least 3 stools in a 24-hour period by age 7 days. The stool should be seedy and yellow.  No loss of weight greater than 10% of birth weight during the first 3 days of age.  Average weight gain of 4-7 ounces (113-198 g) per week after age 4 days.  Consistent daily weight gain by age 5 days, without weight loss after the age of 2 weeks. After a feeding, your baby may spit up a small amount. This is common. BREASTFEEDING FREQUENCY AND DURATION Frequent feeding will help you make more milk and can prevent sore nipples and breast engorgement. Breastfeed when you feel the need to reduce the fullness of your breasts or when your baby shows signs of hunger. This is called "breastfeeding on demand." Avoid introducing a pacifier to your baby while you are working to establish breastfeeding (the first 4-6 weeks after your baby is born). After this time you may choose to use a pacifier. Research has shown that pacifier use during the first year of a baby's life decreases the risk of sudden infant death syndrome (SIDS). Allow your baby to feed on each breast as long as he or she wants. Breastfeed until your baby is finished feeding. When your baby unlatches or falls asleep while feeding from the first breast, offer the second breast. Because newborns are often sleepy in the first few weeks of life, you may need to awaken your baby to get him or her to feed. Breastfeeding times will vary from baby to baby. However, the following rules can serve as a guide to help you ensure that your baby is  properly fed:  Newborns (babies 4 weeks of age or younger) may breastfeed every 1-3 hours.  Newborns should not go longer than 3 hours during the day or 5 hours during the night without breastfeeding.  You should breastfeed your baby a minimum of 8 times in a 24-hour period until you begin to introduce solid foods to your baby at around 6 months of age. BREAST MILK PUMPING Pumping and storing breast milk allows you to ensure that your baby is exclusively fed your breast milk, even at times when you are unable to breastfeed. This is especially important if you are going back to work while you are still breastfeeding or when you are not able to be present during feedings. Your lactation consultant can give you guidelines on how long it is safe to store breast milk. A breast pump is a machine that allows you to pump milk from your breast into a sterile bottle. The pumped breast milk can then be stored in a refrigerator or freezer. Some breast pumps are operated by hand, while others use electricity. Ask your lactation consultant which type will work best for you. Breast pumps can be purchased, but some hospitals and breastfeeding support groups lease breast pumps on a monthly basis. A lactation consultant can teach you how to hand express breast milk, if you prefer not to use a pump. CARING FOR YOUR BREASTS WHILE YOU BREASTFEED Nipples can become dry, cracked, and sore while breastfeeding. The following recommendations can help keep your breasts moisturized and healthy:  Avoid using soap on your nipples.  Wear a supportive bra. Although not required, special nursing bras and tank tops are designed to allow access to your   breasts for breastfeeding without taking off your entire bra or top. Avoid wearing underwire-style bras or extremely tight bras.  Air dry your nipples for 3-4minutes after each feeding.  Use only cotton bra pads to absorb leaked breast milk. Leaking of breast milk between feedings  is normal.  Use lanolin on your nipples after breastfeeding. Lanolin helps to maintain your skin's normal moisture barrier. If you use pure lanolin, you do not need to wash it off before feeding your baby again. Pure lanolin is not toxic to your baby. You may also hand express a few drops of breast milk and gently massage that milk into your nipples and allow the milk to air dry. In the first few weeks after giving birth, some women experience extremely full breasts (engorgement). Engorgement can make your breasts feel heavy, warm, and tender to the touch. Engorgement peaks within 3-5 days after you give birth. The following recommendations can help ease engorgement:  Completely empty your breasts while breastfeeding or pumping. You may want to start by applying warm, moist heat (in the shower or with warm water-soaked hand towels) just before feeding or pumping. This increases circulation and helps the milk flow. If your baby does not completely empty your breasts while breastfeeding, pump any extra milk after he or she is finished.  Wear a snug bra (nursing or regular) or tank top for 1-2 days to signal your body to slightly decrease milk production.  Apply ice packs to your breasts, unless this is too uncomfortable for you.  Make sure that your baby is latched on and positioned properly while breastfeeding. If engorgement persists after 48 hours of following these recommendations, contact your health care provider or a lactation consultant. OVERALL HEALTH CARE RECOMMENDATIONS WHILE BREASTFEEDING  Eat healthy foods. Alternate between meals and snacks, eating 3 of each per day. Because what you eat affects your breast milk, some of the foods may make your baby more irritable than usual. Avoid eating these foods if you are sure that they are negatively affecting your baby.  Drink milk, fruit juice, and water to satisfy your thirst (about 10 glasses a day).  Rest often, relax, and continue to take  your prenatal vitamins to prevent fatigue, stress, and anemia.  Continue breast self-awareness checks.  Avoid chewing and smoking tobacco. Chemicals from cigarettes that pass into breast milk and exposure to secondhand smoke may harm your baby.  Avoid alcohol and drug use, including marijuana. Some medicines that may be harmful to your baby can pass through breast milk. It is important to ask your health care provider before taking any medicine, including all over-the-counter and prescription medicine as well as vitamin and herbal supplements. It is possible to become pregnant while breastfeeding. If birth control is desired, ask your health care provider about options that will be safe for your baby. SEEK MEDICAL CARE IF:  You feel like you want to stop breastfeeding or have become frustrated with breastfeeding.  You have painful breasts or nipples.  Your nipples are cracked or bleeding.  Your breasts are red, tender, or warm.  You have a swollen area on either breast.  You have a fever or chills.  You have nausea or vomiting.  You have drainage other than breast milk from your nipples.  Your breasts do not become full before feedings by the fifth day after you give birth.  You feel sad and depressed.  Your baby is too sleepy to eat well.  Your baby is having trouble sleeping.     Your baby is wetting less than 3 diapers in a 24-hour period.  Your baby has less than 3 stools in a 24-hour period.  Your baby's skin or the white part of his or her eyes becomes yellow.   Your baby is not gaining weight by 5 days of age. SEEK IMMEDIATE MEDICAL CARE IF:  Your baby is overly tired (lethargic) and does not want to wake up and feed.  Your baby develops an unexplained fever.   This information is not intended to replace advice given to you by your health care provider. Make sure you discuss any questions you have with your health care provider.   Document Released: 09/28/2005  Document Revised: 06/19/2015 Document Reviewed: 03/22/2013 Elsevier Interactive Patient Education 2016 [employer]  

## 2015-08-19 NOTE — Progress Notes (Signed)
Subjective:  Erika Potts is a 34 y.o. [id number] at 6677w6d being seen today for ongoing prenatal care.  Patient reports no complaints.  Contractions: Not present.  Vag. Bleeding: None. Movement: Present. Denies leaking of fluid.   The following portions of the patient's history were reviewed and updated as appropriate: allergies, current medications, past family history, past medical history, past social history, past surgical history and problem list. Problem list updated.  Objective:   Filed Vitals:   08/19/15 0917  BP: 108/63  Pulse: 101  Temp: 98.7 F (37.1 C)  Weight: 191 lb 1.6 oz (86.682 kg)    Fetal Status: Fetal Heart Rate (bpm): 140 Fundal Height: 34 cm Movement: Present     General:  Alert, oriented and cooperative. Patient is in no acute distress.  Skin: Skin is warm and dry. No rash noted.   Cardiovascular: Normal heart rate noted  Respiratory: Normal respiratory effort, no problems with respiration noted  Abdomen: Soft, gravid, appropriate for gestational age. Pain/Pressure: Absent     Pelvic: Vag. Bleeding: None     Cervical exam deferred        Extremities: Normal range of motion.  Edema: None  Mental Status: Normal mood and affect. Normal behavior. Normal judgment and thought content.   Urinalysis: Urine Protein: Negative Urine Glucose: 1+ FBS 76-108 (11 of 22 are out of range) 2 hr pp 78-169 (about 1/2 are out of range) Assessment and Plan:  Pregnancy: [id number] at 9977w6d  1. Supervision of high risk pregnancy in third trimester - CBC - HIV antibody - RPR - Tdap (BOOSTRIX) injection 0.5 mL; Inject 0.5 mLs into the muscle once.  2. Insulin controlled gestational diabetes mellitus (GDM) in second trimester Increase glyburide to 2.5 bid - glyBURIDE (DIABETA) 2.5 MG tablet; Take 1 tablet (2.5 mg total) by mouth 2 (two) times daily with a meal.  Dispense: 60 tablet; Refill: 3 - US MFM OB FOLLOW UP; Future  Preterm labor symptoms and general obstetric  precautions including but not limited to vaginal bleeding, contractions, leaking of fluid and fetal movement were reviewed in detail with the patient. Please refer to After Visit Summary for other counseling recommendations.  Return in 2 weeks (on 09/02/2015) [facility], diabetes educator 1 wk.   Reva Boresanya S Kinga Cassar, MD

## 2015-08-19 NOTE — Progress Notes (Signed)
Follow-up growth U/S with [facility] 08/27/15 @ 830a.

## 2015-08-20 LAB — RPR

## 2015-08-20 LAB — HIV ANTIBODY (ROUTINE TESTING W REFLEX): HIV: NONREACTIVE

## 2015-08-26 ENCOUNTER — Encounter: Payer: Self-pay | Attending: Obstetrics and Gynecology | Admitting: *Deleted

## 2015-08-26 ENCOUNTER — Ambulatory Visit: Payer: Self-pay | Admitting: *Deleted

## 2015-08-26 DIAGNOSIS — Z713 Dietary counseling and surveillance: Secondary | ICD-10-CM | POA: Insufficient documentation

## 2015-08-26 DIAGNOSIS — O24419 Gestational diabetes mellitus in pregnancy, unspecified control: Secondary | ICD-10-CM

## 2015-08-26 NOTE — Progress Notes (Signed)
Patient presents for review of glucose readings. FBS 76-99 >50% WNL, 2hpp 59-163  >50% WNL  High readings are due to dietary intake. Recommend decrease dinner intake and always have HS snack. Consider [employer] and 1/2 banana or 1/2 C blackberries. Measuring cups given. Patient verbalized understanding. To return for MD visit 1 week.

## 2015-08-27 ENCOUNTER — Ambulatory Visit (HOSPITAL_COMMUNITY)
Admission: RE | Admit: 2015-08-27 | Discharge: 2015-08-27 | Disposition: A | Discharge status: Home / Self Care | Payer: Self-pay | Source: Ambulatory Visit | Attending: Family Medicine | Admitting: Family Medicine

## 2015-08-27 ENCOUNTER — Encounter (HOSPITAL_COMMUNITY): Payer: Self-pay

## 2015-08-27 ENCOUNTER — Other Ambulatory Visit: Payer: Self-pay | Admitting: Family Medicine

## 2015-08-27 DIAGNOSIS — O24415 Gestational diabetes mellitus in pregnancy, controlled by oral hypoglycemic drugs: Secondary | ICD-10-CM

## 2015-08-27 DIAGNOSIS — Z3A29 29 weeks gestation of pregnancy: Secondary | ICD-10-CM

## 2015-08-27 DIAGNOSIS — O24414 Gestational diabetes mellitus in pregnancy, insulin controlled: Secondary | ICD-10-CM

## 2015-09-02 ENCOUNTER — Encounter: Payer: Self-pay | Admitting: Family Medicine

## 2015-09-02 ENCOUNTER — Ambulatory Visit (INDEPENDENT_AMBULATORY_CARE_PROVIDER_SITE_OTHER): Payer: Self-pay | Admitting: Family Medicine

## 2015-09-02 VITALS — BP 103/60 | HR 84 | Temp 98.0°F | Wt 189.0 lb

## 2015-09-02 DIAGNOSIS — O0993 Supervision of high risk pregnancy, unspecified, third trimester: Secondary | ICD-10-CM

## 2015-09-02 DIAGNOSIS — O24414 Gestational diabetes mellitus in pregnancy, insulin controlled: Secondary | ICD-10-CM

## 2015-09-02 LAB — POCT URINALYSIS DIP (DEVICE)
BILIRUBIN URINE: NEGATIVE
GLUCOSE, UA: NEGATIVE mg/dL
HGB URINE DIPSTICK: NEGATIVE
KETONES UR: NEGATIVE mg/dL
LEUKOCYTES UA: NEGATIVE
Nitrite: NEGATIVE
Protein, ur: NEGATIVE mg/dL
SPECIFIC GRAVITY, URINE: 1.02 (ref 1.005–1.030)
Urobilinogen, UA: 1 mg/dL (ref 0.0–1.0)
pH: 7 (ref 5.0–8.0)

## 2015-09-02 MED ORDER — GLYBURIDE 2.5 MG PO TABS: 2.5000 mg | ORAL_TABLET | Freq: Two times a day (BID) | ORAL | Status: DC

## 2015-09-02 NOTE — Patient Instructions (Signed)
Third Trimester of Pregnancy The third trimester is from week 29 through week 42, months 7 through 9. The third trimester is a time when the fetus is growing rapidly. At the end of the ninth month, the fetus is about 20 inches in length and weighs 6-10 pounds.  BODY CHANGES Your body goes through many changes during pregnancy. The changes vary from woman to woman.   Your weight will continue to increase. You can expect to gain 25-35 pounds (11-16 kg) by the end of the pregnancy.  You may begin to get stretch marks on your hips, abdomen, and breasts.  You may urinate more often because the fetus is moving lower into your pelvis and pressing on your bladder.  You may develop or continue to have heartburn as a result of your pregnancy.  You may develop constipation because certain hormones are causing the muscles that push waste through your intestines to slow down.  You may develop hemorrhoids or swollen, bulging veins (varicose veins).  You may have pelvic pain because of the weight gain and pregnancy hormones relaxing your joints between the bones in your pelvis. Backaches may result from overexertion of the muscles supporting your posture.  You may have changes in your hair. These can include thickening of your hair, rapid growth, and changes in texture. Some women also have hair loss during or after pregnancy, or hair that feels dry or thin. Your hair will most likely return to normal after your baby is born.  Your breasts will continue to grow and be tender. A yellow discharge may leak from your breasts called colostrum.  Your belly button may stick out.  You may feel short of breath because of your expanding uterus.  You may notice the fetus "dropping," or moving lower in your abdomen.  You may have a bloody mucus discharge. This usually occurs a few days to a week before labor begins.  Your cervix becomes thin and soft (effaced) near your due date. WHAT TO EXPECT AT YOUR  PRENATAL EXAMS  You will have prenatal exams every 2 weeks until week 36. Then, you will have weekly prenatal exams. During a routine prenatal visit:  You will be weighed to make sure you and the fetus are growing normally.  Your blood pressure is taken.  Your abdomen will be measured to track your baby's growth.  The fetal heartbeat will be listened to.  Any test results from the previous visit will be discussed.  You may have a cervical check near your due date to see if you have effaced. At around 36 weeks, your caregiver will check your cervix. At the same time, your caregiver will also perform a test on the secretions of the vaginal tissue. This test is to determine if a type of bacteria, Group B streptococcus, is present. Your caregiver will explain this further. Your caregiver may ask you:  What your birth plan is.  How you are feeling.  If you are feeling the baby move.  If you have had any abnormal symptoms, such as leaking fluid, bleeding, severe headaches, or abdominal cramping.  If you are using any tobacco products, including cigarettes, chewing tobacco, and electronic cigarettes.  If you have any questions. Other tests or screenings that may be performed during your third trimester include:  Blood tests that check for low iron levels (anemia).  Fetal testing to check the health, activity level, and growth of the fetus. Testing is done if you have certain medical conditions or if   there are problems during the pregnancy.  HIV (human immunodeficiency virus) testing. If you are at high risk, you may be screened for HIV during your third trimester of pregnancy. FALSE LABOR You may feel small, irregular contractions that eventually go away. These are called Braxton Hicks contractions, or false labor. Contractions may last for hours, days, or even weeks before true labor sets in. If contractions come at regular intervals, intensify, or become painful, it is best to be seen  by your caregiver.  SIGNS OF LABOR   Menstrual-like cramps.  Contractions that are 5 minutes apart or less.  Contractions that start on the top of the uterus and spread down to the lower abdomen and back.  A sense of increased pelvic pressure or back pain.  A watery or bloody mucus discharge that comes from the vagina. If you have any of these signs before the 37th week of pregnancy, call your caregiver right away. You need to go to the hospital to get checked immediately. HOME CARE INSTRUCTIONS   Avoid all smoking, herbs, alcohol, and unprescribed drugs. These chemicals affect the formation and growth of the baby.  Do not use any tobacco products, including cigarettes, chewing tobacco, and electronic cigarettes. If you need help quitting, ask your health care provider. You may receive counseling support and other resources to help you quit.  Follow your caregiver's instructions regarding medicine use. There are medicines that are either safe or unsafe to take during pregnancy.  Exercise only as directed by your caregiver. Experiencing uterine cramps is a good sign to stop exercising.  Continue to eat regular, healthy meals.  Wear a good support bra for breast tenderness.  Do not use hot tubs, steam rooms, or saunas.  Wear your seat belt at all times when driving.  Avoid raw meat, uncooked cheese, cat litter boxes, and soil used by cats. These carry germs that can cause birth defects in the baby.  Take your prenatal vitamins.  Take 1500-2000 mg of calcium daily starting at the 20th week of pregnancy until you deliver your baby.  Try taking a stool softener (if your caregiver approves) if you develop constipation. Eat more high-fiber foods, such as fresh vegetables or fruit and whole grains. Drink plenty of fluids to keep your urine clear or pale yellow.  Take warm sitz baths to soothe any pain or discomfort caused by hemorrhoids. Use hemorrhoid cream if your caregiver  approves.  If you develop varicose veins, wear support hose. Elevate your feet for 15 minutes, 3-4 times a day. Limit salt in your diet.  Avoid heavy lifting, wear low heal shoes, and practice good posture.  Rest a lot with your legs elevated if you have leg cramps or low back pain.  Visit your dentist if you have not gone during your pregnancy. Use a soft toothbrush to brush your teeth and be gentle when you floss.  A sexual relationship may be continued unless your caregiver directs you otherwise.  Do not travel far distances unless it is absolutely necessary and only with the approval of your caregiver.  Take prenatal classes to understand, practice, and ask questions about the labor and delivery.  Make a trial run to the hospital.  Pack your hospital bag.  Prepare the baby's nursery.  Continue to go to all your prenatal visits as directed by your caregiver. SEEK MEDICAL CARE IF:  You are unsure if you are in labor or if your water has broken.  You have dizziness.  You have   mild pelvic cramps, pelvic pressure, or nagging pain in your abdominal area.  You have persistent nausea, vomiting, or diarrhea.  You have a bad smelling vaginal discharge.  You have pain with urination. SEEK IMMEDIATE MEDICAL CARE IF:   You have a fever.  You are leaking fluid from your vagina.  You have spotting or bleeding from your vagina.  You have severe abdominal cramping or pain.  You have rapid weight loss or gain.  You have shortness of breath with chest pain.  You notice sudden or extreme swelling of your face, hands, ankles, feet, or legs.  You have not felt your baby move in over an hour.  You have severe headaches that do not go away with medicine.  You have vision changes.   This information is not intended to replace advice given to you by your health care provider. Make sure you discuss any questions you have with your health care provider.   Document Released:  09/22/2001 Document Revised: 10/19/2014 Document Reviewed: 11/29/2012 Elsevier Interactive Patient Education 2016 [employer]  Breastfeeding Deciding to breastfeed is one of the best choices you can make for you and your baby. A change in hormones during pregnancy causes your breast tissue to grow and increases the number and size of your milk ducts. These hormones also allow proteins, sugars, and fats from your blood supply to make breast milk in your milk-producing glands. Hormones prevent breast milk from being released before your baby is born as well as prompt milk flow after birth. Once breastfeeding has begun, thoughts of your baby, as well as his or her sucking or crying, can stimulate the release of milk from your milk-producing glands.  BENEFITS OF BREASTFEEDING For Your Baby  Your first milk (colostrum) helps your baby's digestive system function better.  There are antibodies in your milk that help your baby fight off infections.  Your baby has a lower incidence of asthma, allergies, and sudden infant death syndrome.  The nutrients in breast milk are better for your baby than infant formulas and are designed uniquely for your baby's needs.  Breast milk improves your baby's brain development.  Your baby is less likely to develop other conditions, such as childhood obesity, asthma, or type 2 diabetes mellitus. For You  Breastfeeding helps to create a very special bond between you and your baby.  Breastfeeding is convenient. Breast milk is always available at the correct temperature and costs nothing.  Breastfeeding helps to burn calories and helps you lose the weight gained during pregnancy.  Breastfeeding makes your uterus contract to its prepregnancy size faster and slows bleeding (lochia) after you give birth.   Breastfeeding helps to lower your risk of developing type 2 diabetes mellitus, osteoporosis, and breast or ovarian cancer later in life. SIGNS THAT YOUR BABY IS  HUNGRY Early Signs of Hunger  Increased alertness or activity.  Stretching.  Movement of the head from side to side.  Movement of the head and opening of the mouth when the corner of the mouth or cheek is stroked (rooting).  Increased sucking sounds, smacking lips, cooing, sighing, or squeaking.  Hand-to-mouth movements.  Increased sucking of fingers or hands. Late Signs of Hunger  Fussing.  Intermittent crying. Extreme Signs of Hunger Signs of extreme hunger will require calming and consoling before your baby will be able to breastfeed successfully. Do not wait for the following signs of extreme hunger to occur before you initiate breastfeeding:  Restlessness.  A loud, strong cry.  Screaming.   BREASTFEEDING BASICS Breastfeeding Initiation  Find a comfortable place to sit or lie down, with your neck and back well supported.  Place a pillow or rolled up blanket under your baby to bring him or her to the level of your breast (if you are seated). Nursing pillows are specially designed to help support your arms and your baby while you breastfeed.  Make sure that your baby's abdomen is facing your abdomen.  Gently massage your breast. With your fingertips, massage from your chest wall toward your nipple in a circular motion. This encourages milk flow. You may need to continue this action during the feeding if your milk flows slowly.  Support your breast with 4 fingers underneath and your thumb above your nipple. Make sure your fingers are well away from your nipple and your baby's mouth.  Stroke your baby's lips gently with your finger or nipple.  When your baby's mouth is open wide enough, quickly bring your baby to your breast, placing your entire nipple and as much of the colored area around your nipple (areola) as possible into your baby's mouth.  More areola should be visible above your baby's upper lip than below the lower lip.  Your baby's tongue should be between his  or her lower gum and your breast.  Ensure that your baby's mouth is correctly positioned around your nipple (latched). Your baby's lips should create a seal on your breast and be turned out (everted).  It is common for your baby to suck about 2-3 minutes in order to start the flow of breast milk. Latching Teaching your baby how to latch on to your breast properly is very important. An improper latch can cause nipple pain and decreased milk supply for you and poor weight gain in your baby. Also, if your baby is not latched onto your nipple properly, he or she may swallow some air during feeding. This can make your baby fussy. Burping your baby when you switch breasts during the feeding can help to get rid of the air. However, teaching your baby to latch on properly is still the best way to prevent fussiness from swallowing air while breastfeeding. Signs that your baby has successfully latched on to your nipple:  Silent tugging or silent sucking, without causing you pain.  Swallowing heard between every 3-4 sucks.  Muscle movement above and in front of his or her ears while sucking. Signs that your baby has not successfully latched on to nipple:  Sucking sounds or smacking sounds from your baby while breastfeeding.  Nipple pain. If you think your baby has not latched on correctly, slip your finger into the corner of your baby's mouth to break the suction and place it between your baby's gums. Attempt breastfeeding initiation again. Signs of Successful Breastfeeding Signs from your baby:  A gradual decrease in the number of sucks or complete cessation of sucking.  Falling asleep.  Relaxation of his or her body.  Retention of a small amount of milk in his or her mouth.  Letting go of your breast by himself or herself. Signs from you:  Breasts that have increased in firmness, weight, and size 1-3 hours after feeding.  Breasts that are softer immediately after  breastfeeding.  Increased milk volume, as well as a change in milk consistency and color by the fifth day of breastfeeding.  Nipples that are not sore, cracked, or bleeding. Signs That Your Baby is Getting Enough Milk  Wetting at least 3 diapers in a 24-hour period.   The urine should be clear and pale yellow by age 5 days.  At least 3 stools in a 24-hour period by age 5 days. The stool should be soft and yellow.  At least 3 stools in a 24-hour period by age 7 days. The stool should be seedy and yellow.  No loss of weight greater than 10% of birth weight during the first 3 days of age.  Average weight gain of 4-7 ounces (113-198 g) per week after age 4 days.  Consistent daily weight gain by age 5 days, without weight loss after the age of 2 weeks. After a feeding, your baby may spit up a small amount. This is common. BREASTFEEDING FREQUENCY AND DURATION Frequent feeding will help you make more milk and can prevent sore nipples and breast engorgement. Breastfeed when you feel the need to reduce the fullness of your breasts or when your baby shows signs of hunger. This is called "breastfeeding on demand." Avoid introducing a pacifier to your baby while you are working to establish breastfeeding (the first 4-6 weeks after your baby is born). After this time you may choose to use a pacifier. Research has shown that pacifier use during the first year of a baby's life decreases the risk of sudden infant death syndrome (SIDS). Allow your baby to feed on each breast as long as he or she wants. Breastfeed until your baby is finished feeding. When your baby unlatches or falls asleep while feeding from the first breast, offer the second breast. Because newborns are often sleepy in the first few weeks of life, you may need to awaken your baby to get him or her to feed. Breastfeeding times will vary from baby to baby. However, the following rules can serve as a guide to help you ensure that your baby is  properly fed:  Newborns (babies 4 weeks of age or younger) may breastfeed every 1-3 hours.  Newborns should not go longer than 3 hours during the day or 5 hours during the night without breastfeeding.  You should breastfeed your baby a minimum of 8 times in a 24-hour period until you begin to introduce solid foods to your baby at around 6 months of age. BREAST MILK PUMPING Pumping and storing breast milk allows you to ensure that your baby is exclusively fed your breast milk, even at times when you are unable to breastfeed. This is especially important if you are going back to work while you are still breastfeeding or when you are not able to be present during feedings. Your lactation consultant can give you guidelines on how long it is safe to store breast milk. A breast pump is a machine that allows you to pump milk from your breast into a sterile bottle. The pumped breast milk can then be stored in a refrigerator or freezer. Some breast pumps are operated by hand, while others use electricity. Ask your lactation consultant which type will work best for you. Breast pumps can be purchased, but some hospitals and breastfeeding support groups lease breast pumps on a monthly basis. A lactation consultant can teach you how to hand express breast milk, if you prefer not to use a pump. CARING FOR YOUR BREASTS WHILE YOU BREASTFEED Nipples can become dry, cracked, and sore while breastfeeding. The following recommendations can help keep your breasts moisturized and healthy:  Avoid using soap on your nipples.  Wear a supportive bra. Although not required, special nursing bras and tank tops are designed to allow access to your   breasts for breastfeeding without taking off your entire bra or top. Avoid wearing underwire-style bras or extremely tight bras.  Air dry your nipples for 3-4minutes after each feeding.  Use only cotton bra pads to absorb leaked breast milk. Leaking of breast milk between feedings  is normal.  Use lanolin on your nipples after breastfeeding. Lanolin helps to maintain your skin's normal moisture barrier. If you use pure lanolin, you do not need to wash it off before feeding your baby again. Pure lanolin is not toxic to your baby. You may also hand express a few drops of breast milk and gently massage that milk into your nipples and allow the milk to air dry. In the first few weeks after giving birth, some women experience extremely full breasts (engorgement). Engorgement can make your breasts feel heavy, warm, and tender to the touch. Engorgement peaks within 3-5 days after you give birth. The following recommendations can help ease engorgement:  Completely empty your breasts while breastfeeding or pumping. You may want to start by applying warm, moist heat (in the shower or with warm water-soaked hand towels) just before feeding or pumping. This increases circulation and helps the milk flow. If your baby does not completely empty your breasts while breastfeeding, pump any extra milk after he or she is finished.  Wear a snug bra (nursing or regular) or tank top for 1-2 days to signal your body to slightly decrease milk production.  Apply ice packs to your breasts, unless this is too uncomfortable for you.  Make sure that your baby is latched on and positioned properly while breastfeeding. If engorgement persists after 48 hours of following these recommendations, contact your health care provider or a lactation consultant. OVERALL HEALTH CARE RECOMMENDATIONS WHILE BREASTFEEDING  Eat healthy foods. Alternate between meals and snacks, eating 3 of each per day. Because what you eat affects your breast milk, some of the foods may make your baby more irritable than usual. Avoid eating these foods if you are sure that they are negatively affecting your baby.  Drink milk, fruit juice, and water to satisfy your thirst (about 10 glasses a day).  Rest often, relax, and continue to take  your prenatal vitamins to prevent fatigue, stress, and anemia.  Continue breast self-awareness checks.  Avoid chewing and smoking tobacco. Chemicals from cigarettes that pass into breast milk and exposure to secondhand smoke may harm your baby.  Avoid alcohol and drug use, including marijuana. Some medicines that may be harmful to your baby can pass through breast milk. It is important to ask your health care provider before taking any medicine, including all over-the-counter and prescription medicine as well as vitamin and herbal supplements. It is possible to become pregnant while breastfeeding. If birth control is desired, ask your health care provider about options that will be safe for your baby. SEEK MEDICAL CARE IF:  You feel like you want to stop breastfeeding or have become frustrated with breastfeeding.  You have painful breasts or nipples.  Your nipples are cracked or bleeding.  Your breasts are red, tender, or warm.  You have a swollen area on either breast.  You have a fever or chills.  You have nausea or vomiting.  You have drainage other than breast milk from your nipples.  Your breasts do not become full before feedings by the fifth day after you give birth.  You feel sad and depressed.  Your baby is too sleepy to eat well.  Your baby is having trouble sleeping.     Your baby is wetting less than 3 diapers in a 24-hour period.  Your baby has less than 3 stools in a 24-hour period.  Your baby's skin or the white part of his or her eyes becomes yellow.   Your baby is not gaining weight by 5 days of age. SEEK IMMEDIATE MEDICAL CARE IF:  Your baby is overly tired (lethargic) and does not want to wake up and feed.  Your baby develops an unexplained fever.   This information is not intended to replace advice given to you by your health care provider. Make sure you discuss any questions you have with your health care provider.   Document Released: 09/28/2005  Document Revised: 06/19/2015 Document Reviewed: 03/22/2013 Elsevier Interactive Patient Education 2016 [employer]  

## 2015-09-02 NOTE — Progress Notes (Signed)
Reviewed tip of week with patient Needs hep b drawn?

## 2015-09-02 NOTE — Progress Notes (Signed)
Subjective:  Erika Potts is a 34 y.o. [id number] at 8012w6d being seen today for ongoing prenatal care.  She is currently monitored for the following issues for this high-risk pregnancy: Patient Active Problem List   Diagnosis Date Noted  . Supervision of high risk pregnancy in third trimester 07/15/2015    Priority: High  . A2GDM (Glyburide) 07/15/2015    Priority: Medium  . 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  Contractions: Not present. Vag. Bleeding: None.  Movement: Present. Denies leaking of fluid.   The following portions of the patient's history were reviewed and updated as appropriate: allergies, current medications, past family history, past medical history, past social history, past surgical history and problem list. Problem list updated.  Objective:   Filed Vitals:   09/02/15 0912  BP: 103/60  Pulse: 84  Temp: 98 F (36.7 C)  Weight: 189 lb (85.73 kg)    Fetal Status: Fetal Heart Rate (bpm): 143 Fundal Height: 32 cm Movement: Present     General:  Alert, oriented and cooperative. Patient is in no acute distress.  Skin: Skin is warm and dry. No rash noted.   Cardiovascular: Normal heart rate noted  Respiratory: Normal respiratory effort, no problems with respiration noted  Abdomen: Soft, gravid, appropriate for gestational age. Pain/Pressure: Absent     Pelvic: Vag. Bleeding: None     Cervical exam deferred        Extremities: Normal range of motion.  Edema: None  Mental Status: Normal mood and affect. Normal behavior. Normal judgment and thought content.   Urinalysis: Urine Protein: Negative Urine Glucose: Negative FBS 74-98 2 hr pp 65-163 (few out of range) U/s for growth shows EFW at 70% Assessment and Plan:  Pregnancy: G5P4004 at 4912w6d  1. Supervision of high risk pregnancy in third trimester Continue prenatal care.   2. Insulin controlled  gestational diabetes mellitus (GDM) in second trimester BS ok--continue current meds - glyBURIDE (DIABETA) 2.5 MG tablet; Take 1 tablet (2.5 mg total) by mouth 2 (two) times daily with a meal.  Dispense: 60 tablet; Refill: 3  Preterm labor symptoms and general obstetric precautions including but not limited to vaginal bleeding, contractions, leaking of fluid and fetal movement were reviewed in detail with the patient. Please refer to After Visit Summary for other counseling recommendations.  Return in 2 weeks (on 09/16/2015).   Reva Boresanya S Tatsuo Musial, MD

## 2015-09-16 ENCOUNTER — Ambulatory Visit (INDEPENDENT_AMBULATORY_CARE_PROVIDER_SITE_OTHER): Payer: Self-pay | Admitting: Family Medicine

## 2015-09-16 VITALS — BP 107/67 | HR 84 | Temp 98.5°F | Wt 189.1 lb

## 2015-09-16 DIAGNOSIS — O0993 Supervision of high risk pregnancy, unspecified, third trimester: Secondary | ICD-10-CM

## 2015-09-16 DIAGNOSIS — O24414 Gestational diabetes mellitus in pregnancy, insulin controlled: Secondary | ICD-10-CM

## 2015-09-16 LAB — POCT URINALYSIS DIP (DEVICE)
Glucose, UA: NEGATIVE mg/dL
HGB URINE DIPSTICK: NEGATIVE
KETONES UR: 80 mg/dL — AB
LEUKOCYTES UA: NEGATIVE
NITRITE: NEGATIVE
Protein, ur: 30 mg/dL — AB
SPECIFIC GRAVITY, URINE: 1.025 (ref 1.005–1.030)
UROBILINOGEN UA: 2 mg/dL — AB (ref 0.0–1.0)
pH: 6 (ref 5.0–8.0)

## 2015-09-16 NOTE — Progress Notes (Signed)
Subjective:  Erika Potts is a 34 y.o. [id number] at 5025w6d being seen today for ongoing prenatal care.  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  Patient reports no complaints.  Contractions: Not present. Vag. Bleeding: None.  Movement: Present. Denies leaking of fluid.   The following portions of the patient's history were reviewed and updated as appropriate: allergies, current medications, past family history, past medical history, past social history, past surgical history and problem list. Problem list updated.  Objective:   Filed Vitals:   09/16/15 0958  BP: 107/67  Pulse: 84  Temp: 98.5 F (36.9 C)  Weight: 189 lb 1.6 oz (85.775 kg)    Fetal Status: Fetal Heart Rate (bpm): 145 Fundal Height: 34 cm Movement: Present     General:  Alert, oriented and cooperative. Patient is in no acute distress.  Skin: Skin is warm and dry. No rash noted.   Cardiovascular: Normal heart rate noted  Respiratory: Normal respiratory effort, no problems with respiration noted  Abdomen: Soft, gravid, appropriate for gestational age. Pain/Pressure: Absent     Pelvic: Vag. Bleeding: None     Cervical exam deferred        Extremities: Normal range of motion.  Edema: None  Mental Status: Normal mood and affect. Normal behavior. Normal judgment and thought content.   Urinalysis: Urine Protein: 1+ Urine Glucose: Negative FBS 72-101(4 of 14 out of range) 2 hour pp 62-147 (3 of 14 out of range) Assessment and Plan:  Pregnancy: G5P4004 at 7725w6d  1. Supervision of high risk pregnancy in third trimester Continue prenatal care.   2. Insulin controlled gestational diabetes mellitus (GDM) in second trimester Continue her Glyburide Begin 2x/wk testing at 32 wks.  Preterm labor  symptoms and general obstetric precautions including but not limited to vaginal bleeding, contractions, leaking of fluid and fetal movement were reviewed in detail with the patient. Please refer to After Visit Summary for other counseling recommendations.  Return in about 1 week (around 09/23/2015) for OB visit and NST, HRC, NST only in 4 days.   Reva Boresanya S Takyra Cantrall, MD

## 2015-09-16 NOTE — Patient Instructions (Signed)
Third Trimester of Pregnancy The third trimester is from week 29 through week 42, months 7 through 9. The third trimester is a time when the fetus is growing rapidly. At the end of the ninth month, the fetus is about 20 inches in length and weighs 6-10 pounds.  BODY CHANGES Your body goes through many changes during pregnancy. The changes vary from woman to woman.   Your weight will continue to increase. You can expect to gain 25-35 pounds (11-16 kg) by the end of the pregnancy.  You may begin to get stretch marks on your hips, abdomen, and breasts.  You may urinate more often because the fetus is moving lower into your pelvis and pressing on your bladder.  You may develop or continue to have heartburn as a result of your pregnancy.  You may develop constipation because certain hormones are causing the muscles that push waste through your intestines to slow down.  You may develop hemorrhoids or swollen, bulging veins (varicose veins).  You may have pelvic pain because of the weight gain and pregnancy hormones relaxing your joints between the bones in your pelvis. Backaches may result from overexertion of the muscles supporting your posture.  You may have changes in your hair. These can include thickening of your hair, rapid growth, and changes in texture. Some women also have hair loss during or after pregnancy, or hair that feels dry or thin. Your hair will most likely return to normal after your baby is born.  Your breasts will continue to grow and be tender. A yellow discharge may leak from your breasts called colostrum.  Your belly button may stick out.  You may feel short of breath because of your expanding uterus.  You may notice the fetus "dropping," or moving lower in your abdomen.  You may have a bloody mucus discharge. This usually occurs a few days to a week before labor begins.  Your cervix becomes thin and soft (effaced) near your due date. WHAT TO EXPECT AT YOUR  PRENATAL EXAMS  You will have prenatal exams every 2 weeks until week 36. Then, you will have weekly prenatal exams. During a routine prenatal visit:  You will be weighed to make sure you and the fetus are growing normally.  Your blood pressure is taken.  Your abdomen will be measured to track your baby's growth.  The fetal heartbeat will be listened to.  Any test results from the previous visit will be discussed.  You may have a cervical check near your due date to see if you have effaced. At around 36 weeks, your caregiver will check your cervix. At the same time, your caregiver will also perform a test on the secretions of the vaginal tissue. This test is to determine if a type of bacteria, Group B streptococcus, is present. Your caregiver will explain this further. Your caregiver may ask you:  What your birth plan is.  How you are feeling.  If you are feeling the baby move.  If you have had any abnormal symptoms, such as leaking fluid, bleeding, severe headaches, or abdominal cramping.  If you are using any tobacco products, including cigarettes, chewing tobacco, and electronic cigarettes.  If you have any questions. Other tests or screenings that may be performed during your third trimester include:  Blood tests that check for low iron levels (anemia).  Fetal testing to check the health, activity level, and growth of the fetus. Testing is done if you have certain medical conditions or if   there are problems during the pregnancy.  HIV (human immunodeficiency virus) testing. If you are at high risk, you may be screened for HIV during your third trimester of pregnancy. FALSE LABOR You may feel small, irregular contractions that eventually go away. These are called Braxton Hicks contractions, or false labor. Contractions may last for hours, days, or even weeks before true labor sets in. If contractions come at regular intervals, intensify, or become painful, it is best to be seen  by your caregiver.  SIGNS OF LABOR   Menstrual-like cramps.  Contractions that are 5 minutes apart or less.  Contractions that start on the top of the uterus and spread down to the lower abdomen and back.  A sense of increased pelvic pressure or back pain.  A watery or bloody mucus discharge that comes from the vagina. If you have any of these signs before the 37th week of pregnancy, call your caregiver right away. You need to go to the hospital to get checked immediately. HOME CARE INSTRUCTIONS   Avoid all smoking, herbs, alcohol, and unprescribed drugs. These chemicals affect the formation and growth of the baby.  Do not use any tobacco products, including cigarettes, chewing tobacco, and electronic cigarettes. If you need help quitting, ask your health care provider. You may receive counseling support and other resources to help you quit.  Follow your caregiver's instructions regarding medicine use. There are medicines that are either safe or unsafe to take during pregnancy.  Exercise only as directed by your caregiver. Experiencing uterine cramps is a good sign to stop exercising.  Continue to eat regular, healthy meals.  Wear a good support bra for breast tenderness.  Do not use hot tubs, steam rooms, or saunas.  Wear your seat belt at all times when driving.  Avoid raw meat, uncooked cheese, cat litter boxes, and soil used by cats. These carry germs that can cause birth defects in the baby.  Take your prenatal vitamins.  Take 1500-2000 mg of calcium daily starting at the 20th week of pregnancy until you deliver your baby.  Try taking a stool softener (if your caregiver approves) if you develop constipation. Eat more high-fiber foods, such as fresh vegetables or fruit and whole grains. Drink plenty of fluids to keep your urine clear or pale yellow.  Take warm sitz baths to soothe any pain or discomfort caused by hemorrhoids. Use hemorrhoid cream if your caregiver  approves.  If you develop varicose veins, wear support hose. Elevate your feet for 15 minutes, 3-4 times a day. Limit salt in your diet.  Avoid heavy lifting, wear low heal shoes, and practice good posture.  Rest a lot with your legs elevated if you have leg cramps or low back pain.  Visit your dentist if you have not gone during your pregnancy. Use a soft toothbrush to brush your teeth and be gentle when you floss.  A sexual relationship may be continued unless your caregiver directs you otherwise.  Do not travel far distances unless it is absolutely necessary and only with the approval of your caregiver.  Take prenatal classes to understand, practice, and ask questions about the labor and delivery.  Make a trial run to the hospital.  Pack your hospital bag.  Prepare the baby's nursery.  Continue to go to all your prenatal visits as directed by your caregiver. SEEK MEDICAL CARE IF:  You are unsure if you are in labor or if your water has broken.  You have dizziness.  You have   mild pelvic cramps, pelvic pressure, or nagging pain in your abdominal area.  You have persistent nausea, vomiting, or diarrhea.  You have a bad smelling vaginal discharge.  You have pain with urination. SEEK IMMEDIATE MEDICAL CARE IF:   You have a fever.  You are leaking fluid from your vagina.  You have spotting or bleeding from your vagina.  You have severe abdominal cramping or pain.  You have rapid weight loss or gain.  You have shortness of breath with chest pain.  You notice sudden or extreme swelling of your face, hands, ankles, feet, or legs.  You have not felt your baby move in over an hour.  You have severe headaches that do not go away with medicine.  You have vision changes.   This information is not intended to replace advice given to you by your health care provider. Make sure you discuss any questions you have with your health care provider.   Document Released:  09/22/2001 Document Revised: 10/19/2014 Document Reviewed: 11/29/2012 Elsevier Interactive Patient Education 2016 [employer]  Breastfeeding Deciding to breastfeed is one of the best choices you can make for you and your baby. A change in hormones during pregnancy causes your breast tissue to grow and increases the number and size of your milk ducts. These hormones also allow proteins, sugars, and fats from your blood supply to make breast milk in your milk-producing glands. Hormones prevent breast milk from being released before your baby is born as well as prompt milk flow after birth. Once breastfeeding has begun, thoughts of your baby, as well as his or her sucking or crying, can stimulate the release of milk from your milk-producing glands.  BENEFITS OF BREASTFEEDING For Your Baby  Your first milk (colostrum) helps your baby's digestive system function better.  There are antibodies in your milk that help your baby fight off infections.  Your baby has a lower incidence of asthma, allergies, and sudden infant death syndrome.  The nutrients in breast milk are better for your baby than infant formulas and are designed uniquely for your baby's needs.  Breast milk improves your baby's brain development.  Your baby is less likely to develop other conditions, such as childhood obesity, asthma, or type 2 diabetes mellitus. For You  Breastfeeding helps to create a very special bond between you and your baby.  Breastfeeding is convenient. Breast milk is always available at the correct temperature and costs nothing.  Breastfeeding helps to burn calories and helps you lose the weight gained during pregnancy.  Breastfeeding makes your uterus contract to its prepregnancy size faster and slows bleeding (lochia) after you give birth.   Breastfeeding helps to lower your risk of developing type 2 diabetes mellitus, osteoporosis, and breast or ovarian cancer later in life. SIGNS THAT YOUR BABY IS  HUNGRY Early Signs of Hunger  Increased alertness or activity.  Stretching.  Movement of the head from side to side.  Movement of the head and opening of the mouth when the corner of the mouth or cheek is stroked (rooting).  Increased sucking sounds, smacking lips, cooing, sighing, or squeaking.  Hand-to-mouth movements.  Increased sucking of fingers or hands. Late Signs of Hunger  Fussing.  Intermittent crying. Extreme Signs of Hunger Signs of extreme hunger will require calming and consoling before your baby will be able to breastfeed successfully. Do not wait for the following signs of extreme hunger to occur before you initiate breastfeeding:  Restlessness.  A loud, strong cry.  Screaming.   BREASTFEEDING BASICS Breastfeeding Initiation  Find a comfortable place to sit or lie down, with your neck and back well supported.  Place a pillow or rolled up blanket under your baby to bring him or her to the level of your breast (if you are seated). Nursing pillows are specially designed to help support your arms and your baby while you breastfeed.  Make sure that your baby's abdomen is facing your abdomen.  Gently massage your breast. With your fingertips, massage from your chest wall toward your nipple in a circular motion. This encourages milk flow. You may need to continue this action during the feeding if your milk flows slowly.  Support your breast with 4 fingers underneath and your thumb above your nipple. Make sure your fingers are well away from your nipple and your baby's mouth.  Stroke your baby's lips gently with your finger or nipple.  When your baby's mouth is open wide enough, quickly bring your baby to your breast, placing your entire nipple and as much of the colored area around your nipple (areola) as possible into your baby's mouth.  More areola should be visible above your baby's upper lip than below the lower lip.  Your baby's tongue should be between his  or her lower gum and your breast.  Ensure that your baby's mouth is correctly positioned around your nipple (latched). Your baby's lips should create a seal on your breast and be turned out (everted).  It is common for your baby to suck about 2-3 minutes in order to start the flow of breast milk. Latching Teaching your baby how to latch on to your breast properly is very important. An improper latch can cause nipple pain and decreased milk supply for you and poor weight gain in your baby. Also, if your baby is not latched onto your nipple properly, he or she may swallow some air during feeding. This can make your baby fussy. Burping your baby when you switch breasts during the feeding can help to get rid of the air. However, teaching your baby to latch on properly is still the best way to prevent fussiness from swallowing air while breastfeeding. Signs that your baby has successfully latched on to your nipple:  Silent tugging or silent sucking, without causing you pain.  Swallowing heard between every 3-4 sucks.  Muscle movement above and in front of his or her ears while sucking. Signs that your baby has not successfully latched on to nipple:  Sucking sounds or smacking sounds from your baby while breastfeeding.  Nipple pain. If you think your baby has not latched on correctly, slip your finger into the corner of your baby's mouth to break the suction and place it between your baby's gums. Attempt breastfeeding initiation again. Signs of Successful Breastfeeding Signs from your baby:  A gradual decrease in the number of sucks or complete cessation of sucking.  Falling asleep.  Relaxation of his or her body.  Retention of a small amount of milk in his or her mouth.  Letting go of your breast by himself or herself. Signs from you:  Breasts that have increased in firmness, weight, and size 1-3 hours after feeding.  Breasts that are softer immediately after  breastfeeding.  Increased milk volume, as well as a change in milk consistency and color by the fifth day of breastfeeding.  Nipples that are not sore, cracked, or bleeding. Signs That Your Baby is Getting Enough Milk  Wetting at least 3 diapers in a 24-hour period.   The urine should be clear and pale yellow by age 5 days.  At least 3 stools in a 24-hour period by age 5 days. The stool should be soft and yellow.  At least 3 stools in a 24-hour period by age 7 days. The stool should be seedy and yellow.  No loss of weight greater than 10% of birth weight during the first 3 days of age.  Average weight gain of 4-7 ounces (113-198 g) per week after age 4 days.  Consistent daily weight gain by age 5 days, without weight loss after the age of 2 weeks. After a feeding, your baby may spit up a small amount. This is common. BREASTFEEDING FREQUENCY AND DURATION Frequent feeding will help you make more milk and can prevent sore nipples and breast engorgement. Breastfeed when you feel the need to reduce the fullness of your breasts or when your baby shows signs of hunger. This is called "breastfeeding on demand." Avoid introducing a pacifier to your baby while you are working to establish breastfeeding (the first 4-6 weeks after your baby is born). After this time you may choose to use a pacifier. Research has shown that pacifier use during the first year of a baby's life decreases the risk of sudden infant death syndrome (SIDS). Allow your baby to feed on each breast as long as he or she wants. Breastfeed until your baby is finished feeding. When your baby unlatches or falls asleep while feeding from the first breast, offer the second breast. Because newborns are often sleepy in the first few weeks of life, you may need to awaken your baby to get him or her to feed. Breastfeeding times will vary from baby to baby. However, the following rules can serve as a guide to help you ensure that your baby is  properly fed:  Newborns (babies 4 weeks of age or younger) may breastfeed every 1-3 hours.  Newborns should not go longer than 3 hours during the day or 5 hours during the night without breastfeeding.  You should breastfeed your baby a minimum of 8 times in a 24-hour period until you begin to introduce solid foods to your baby at around 6 months of age. BREAST MILK PUMPING Pumping and storing breast milk allows you to ensure that your baby is exclusively fed your breast milk, even at times when you are unable to breastfeed. This is especially important if you are going back to work while you are still breastfeeding or when you are not able to be present during feedings. Your lactation consultant can give you guidelines on how long it is safe to store breast milk. A breast pump is a machine that allows you to pump milk from your breast into a sterile bottle. The pumped breast milk can then be stored in a refrigerator or freezer. Some breast pumps are operated by hand, while others use electricity. Ask your lactation consultant which type will work best for you. Breast pumps can be purchased, but some hospitals and breastfeeding support groups lease breast pumps on a monthly basis. A lactation consultant can teach you how to hand express breast milk, if you prefer not to use a pump. CARING FOR YOUR BREASTS WHILE YOU BREASTFEED Nipples can become dry, cracked, and sore while breastfeeding. The following recommendations can help keep your breasts moisturized and healthy:  Avoid using soap on your nipples.  Wear a supportive bra. Although not required, special nursing bras and tank tops are designed to allow access to your   breasts for breastfeeding without taking off your entire bra or top. Avoid wearing underwire-style bras or extremely tight bras.  Air dry your nipples for 3-4minutes after each feeding.  Use only cotton bra pads to absorb leaked breast milk. Leaking of breast milk between feedings  is normal.  Use lanolin on your nipples after breastfeeding. Lanolin helps to maintain your skin's normal moisture barrier. If you use pure lanolin, you do not need to wash it off before feeding your baby again. Pure lanolin is not toxic to your baby. You may also hand express a few drops of breast milk and gently massage that milk into your nipples and allow the milk to air dry. In the first few weeks after giving birth, some women experience extremely full breasts (engorgement). Engorgement can make your breasts feel heavy, warm, and tender to the touch. Engorgement peaks within 3-5 days after you give birth. The following recommendations can help ease engorgement:  Completely empty your breasts while breastfeeding or pumping. You may want to start by applying warm, moist heat (in the shower or with warm water-soaked hand towels) just before feeding or pumping. This increases circulation and helps the milk flow. If your baby does not completely empty your breasts while breastfeeding, pump any extra milk after he or she is finished.  Wear a snug bra (nursing or regular) or tank top for 1-2 days to signal your body to slightly decrease milk production.  Apply ice packs to your breasts, unless this is too uncomfortable for you.  Make sure that your baby is latched on and positioned properly while breastfeeding. If engorgement persists after 48 hours of following these recommendations, contact your health care provider or a lactation consultant. OVERALL HEALTH CARE RECOMMENDATIONS WHILE BREASTFEEDING  Eat healthy foods. Alternate between meals and snacks, eating 3 of each per day. Because what you eat affects your breast milk, some of the foods may make your baby more irritable than usual. Avoid eating these foods if you are sure that they are negatively affecting your baby.  Drink milk, fruit juice, and water to satisfy your thirst (about 10 glasses a day).  Rest often, relax, and continue to take  your prenatal vitamins to prevent fatigue, stress, and anemia.  Continue breast self-awareness checks.  Avoid chewing and smoking tobacco. Chemicals from cigarettes that pass into breast milk and exposure to secondhand smoke may harm your baby.  Avoid alcohol and drug use, including marijuana. Some medicines that may be harmful to your baby can pass through breast milk. It is important to ask your health care provider before taking any medicine, including all over-the-counter and prescription medicine as well as vitamin and herbal supplements. It is possible to become pregnant while breastfeeding. If birth control is desired, ask your health care provider about options that will be safe for your baby. SEEK MEDICAL CARE IF:  You feel like you want to stop breastfeeding or have become frustrated with breastfeeding.  You have painful breasts or nipples.  Your nipples are cracked or bleeding.  Your breasts are red, tender, or warm.  You have a swollen area on either breast.  You have a fever or chills.  You have nausea or vomiting.  You have drainage other than breast milk from your nipples.  Your breasts do not become full before feedings by the fifth day after you give birth.  You feel sad and depressed.  Your baby is too sleepy to eat well.  Your baby is having trouble sleeping.     Your baby is wetting less than 3 diapers in a 24-hour period.  Your baby has less than 3 stools in a 24-hour period.  Your baby's skin or the white part of his or her eyes becomes yellow.   Your baby is not gaining weight by 5 days of age. SEEK IMMEDIATE MEDICAL CARE IF:  Your baby is overly tired (lethargic) and does not want to wake up and feed.  Your baby develops an unexplained fever.   This information is not intended to replace advice given to you by your health care provider. Make sure you discuss any questions you have with your health care provider.   Document Released: 09/28/2005  Document Revised: 06/19/2015 Document Reviewed: 03/22/2013 Elsevier Interactive Patient Education 2016 [employer]  

## 2015-09-19 ENCOUNTER — Ambulatory Visit (INDEPENDENT_AMBULATORY_CARE_PROVIDER_SITE_OTHER): Payer: Self-pay | Admitting: *Deleted

## 2015-09-19 VITALS — BP 91/49 | HR 81

## 2015-09-19 DIAGNOSIS — O24415 Gestational diabetes mellitus in pregnancy, controlled by oral hypoglycemic drugs: Secondary | ICD-10-CM

## 2015-09-19 NOTE — Progress Notes (Signed)
12/8 NST reviewed and reactive 

## 2015-09-23 ENCOUNTER — Encounter: Payer: Self-pay | Admitting: Obstetrics and Gynecology

## 2015-09-23 ENCOUNTER — Ambulatory Visit (INDEPENDENT_AMBULATORY_CARE_PROVIDER_SITE_OTHER): Payer: Self-pay | Admitting: Obstetrics and Gynecology

## 2015-09-23 VITALS — BP 97/50 | HR 102 | Wt 189.3 lb

## 2015-09-23 DIAGNOSIS — O99820 Streptococcus B carrier state complicating pregnancy: Secondary | ICD-10-CM

## 2015-09-23 DIAGNOSIS — O9989 Other specified diseases and conditions complicating pregnancy, childbirth and the puerperium: Secondary | ICD-10-CM

## 2015-09-23 DIAGNOSIS — O24419 Gestational diabetes mellitus in pregnancy, unspecified control: Secondary | ICD-10-CM

## 2015-09-23 DIAGNOSIS — Z22330 Carrier of Group B streptococcus: Secondary | ICD-10-CM

## 2015-09-23 DIAGNOSIS — O24414 Gestational diabetes mellitus in pregnancy, insulin controlled: Secondary | ICD-10-CM

## 2015-09-23 DIAGNOSIS — Z36 Encounter for antenatal screening of mother: Secondary | ICD-10-CM

## 2015-09-23 DIAGNOSIS — O0993 Supervision of high risk pregnancy, unspecified, third trimester: Secondary | ICD-10-CM

## 2015-09-23 LAB — POCT URINALYSIS DIP (DEVICE)
Glucose, UA: NEGATIVE mg/dL
Hgb urine dipstick: NEGATIVE
Ketones, ur: >=160 mg/dL — AB
LEUKOCYTES UA: NEGATIVE
NITRITE: NEGATIVE
PH: 6 (ref 5.0–8.0)
Protein, ur: 30 mg/dL — AB
Specific Gravity, Urine: 1.025 (ref 1.005–1.030)
Urobilinogen, UA: 4 mg/dL — ABNORMAL HIGH (ref 0.0–1.0)

## 2015-09-23 NOTE — Progress Notes (Signed)
Subjective:  Erika Potts is a 34 y.o. [id number] at 6386w6d being seen today for ongoing prenatal care.  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  Patient reports no complaints.  Contractions: Irregular. Vag. Bleeding: None.  Movement: Present. Denies leaking of fluid.   The following portions of the patient's history were reviewed and updated as appropriate: allergies, current medications, past family history, past medical history, past social history, past surgical history and problem list. Problem list updated.  Objective:   Filed Vitals:   09/23/15 1058  BP: 97/50  Pulse: 102  Weight: 189 lb 4.8 oz (85.866 kg)    Fetal Status: Fetal Heart Rate (bpm): NST   Movement: Present  Presentation: Vertex  General:  Alert, oriented and cooperative. Patient is in no acute distress.  Skin: Skin is warm and dry. No rash noted.   Cardiovascular: Normal heart rate noted  Respiratory: Normal respiratory effort, no problems with respiration noted  Abdomen: Soft, gravid, appropriate for gestational age. Pain/Pressure: Present     Pelvic: Vag. Bleeding: None     Cervical exam deferred        Extremities: Normal range of motion.  Edema: None  Mental Status: Normal mood and affect. Normal behavior. Normal judgment and thought content.   Urinalysis:      Assessment and Plan:  Pregnancy: G5P4004 at 286w6d  1. Gestational diabetes mellitus in third trimester, unspecified diabetic control CBGs reviewed and majority within range, 2 elevated pp with annotation demonstrating excessive carb consumtpion Continue current glyburide regimen NST reviewed and reactive - Amniotic fluid index with NST  2. 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  Supervision of high risk pregnancy in third trimester   4. Insulin controlled gestational diabetes mellitus (GDM) in second trimester   Preterm labor symptoms and general obstetric precautions including but not limited to vaginal bleeding, contractions, leaking of fluid and fetal movement were reviewed in detail with the patient. Please refer to After Visit Summary for other counseling recommendations.  Return in about 3 days (around 09/26/2015) for 2x/wk as scheduled.   Catalina AntiguaPeggy Alaja Goldinger, MD

## 2015-09-23 NOTE — Progress Notes (Signed)
Breastfeeding tip of the week reviewed. 

## 2015-09-26 ENCOUNTER — Ambulatory Visit (INDEPENDENT_AMBULATORY_CARE_PROVIDER_SITE_OTHER): Payer: Self-pay | Admitting: *Deleted

## 2015-09-26 VITALS — BP 95/54 | HR 91

## 2015-09-26 DIAGNOSIS — O24419 Gestational diabetes mellitus in pregnancy, unspecified control: Secondary | ICD-10-CM

## 2015-09-30 ENCOUNTER — Ambulatory Visit (INDEPENDENT_AMBULATORY_CARE_PROVIDER_SITE_OTHER): Payer: Self-pay | Admitting: Obstetrics & Gynecology

## 2015-09-30 VITALS — BP 96/58 | HR 97 | Wt 189.1 lb

## 2015-09-30 DIAGNOSIS — O0993 Supervision of high risk pregnancy, unspecified, third trimester: Secondary | ICD-10-CM

## 2015-09-30 DIAGNOSIS — Z36 Encounter for antenatal screening of mother: Secondary | ICD-10-CM

## 2015-09-30 DIAGNOSIS — O24419 Gestational diabetes mellitus in pregnancy, unspecified control: Secondary | ICD-10-CM

## 2015-09-30 LAB — POCT URINALYSIS DIP (DEVICE)
BILIRUBIN URINE: NEGATIVE
Glucose, UA: NEGATIVE mg/dL
KETONES UR: 40 mg/dL — AB
LEUKOCYTES UA: NEGATIVE
Nitrite: NEGATIVE
Protein, ur: NEGATIVE mg/dL
SPECIFIC GRAVITY, URINE: 1.025 (ref 1.005–1.030)
Urobilinogen, UA: 1 mg/dL (ref 0.0–1.0)
pH: 6 (ref 5.0–8.0)

## 2015-09-30 NOTE — Progress Notes (Signed)
Subjective:  Erika Potts is a 34 y.o. [id number] at 864w6d being seen today for ongoing prenatal care.  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  Patient reports no complaints.   . Vag. Bleeding: None.  Movement: Present. Denies leaking of fluid.   The following portions of the patient's history were reviewed and updated as appropriate: allergies, current medications, past family history, past medical history, past social history, past surgical history and problem list. Problem list updated.  Objective:   Filed Vitals:   09/30/15 1002  BP: 96/58  Pulse: 97  Weight: 189 lb 1.6 oz (85.775 kg)    Fetal Status: Fetal Heart Rate (bpm): RNST Fundal Height: 34 cm Movement: Present  Presentation: Vertex  General:  Alert, oriented and cooperative. Patient is in no acute distress.  Skin: Skin is warm and dry. No rash noted.   Cardiovascular: Normal heart rate noted  Respiratory: Normal respiratory effort, no problems with respiration noted  Abdomen: Soft, gravid, appropriate for gestational age. Pain/Pressure: Present     Pelvic: Vag. Bleeding: None     Cervical exam deferred        Extremities: Normal range of motion.  Edema: None  Mental Status: Normal mood and affect. Normal behavior. Normal judgment and thought content.   Urinalysis: Urine Protein: Negative Urine Glucose: Negative  Assessment and Plan:  Pregnancy: G5P4004 at 384w6d  1. Gestational diabetes mellitus in third trimester, unspecified diabetic control -Takes one glyburide after lunch.  Reported CBGs are all nml.  This is not how medication is perscribed, but there is good glycemic control with no hypoglycemia - Amniotic fluid index with NST    Preterm labor symptoms and general obstetric precautions  including but not limited to vaginal bleeding, contractions, leaking of fluid and fetal movement were reviewed in detail with the patient. Please refer to After Visit Summary for other counseling recommendations.  Return in about 3 days (around 10/03/2015) for 2x/wk as scheduled.   Lesly DukesKelly H Leggett, MD

## 2015-09-30 NOTE — Progress Notes (Signed)
US for growth and BPP scheduled 12/27

## 2015-10-03 ENCOUNTER — Ambulatory Visit (INDEPENDENT_AMBULATORY_CARE_PROVIDER_SITE_OTHER): Payer: Self-pay | Admitting: *Deleted

## 2015-10-03 VITALS — BP 93/54 | HR 92

## 2015-10-03 DIAGNOSIS — O24419 Gestational diabetes mellitus in pregnancy, unspecified control: Secondary | ICD-10-CM

## 2015-10-03 NOTE — Progress Notes (Signed)
NST reviewed and reactive.  

## 2015-10-04 NOTE — Progress Notes (Signed)
Late documentation - NST reactive

## 2015-10-08 ENCOUNTER — Ambulatory Visit (INDEPENDENT_AMBULATORY_CARE_PROVIDER_SITE_OTHER): Payer: Self-pay | Admitting: *Deleted

## 2015-10-08 ENCOUNTER — Other Ambulatory Visit: Payer: Self-pay | Admitting: Obstetrics and Gynecology

## 2015-10-08 ENCOUNTER — Ambulatory Visit (HOSPITAL_COMMUNITY)
Admission: RE | Admit: 2015-10-08 | Discharge: 2015-10-08 | Disposition: A | Discharge status: Home / Self Care | Payer: Self-pay | Source: Ambulatory Visit | Attending: Obstetrics and Gynecology | Admitting: Obstetrics and Gynecology

## 2015-10-08 ENCOUNTER — Encounter (HOSPITAL_COMMUNITY): Payer: Self-pay

## 2015-10-08 VITALS — BP 96/53 | HR 88

## 2015-10-08 DIAGNOSIS — O24419 Gestational diabetes mellitus in pregnancy, unspecified control: Secondary | ICD-10-CM

## 2015-10-08 DIAGNOSIS — Z3A35 35 weeks gestation of pregnancy: Secondary | ICD-10-CM | POA: Insufficient documentation

## 2015-10-08 DIAGNOSIS — O24415 Gestational diabetes mellitus in pregnancy, controlled by oral hypoglycemic drugs: Secondary | ICD-10-CM | POA: Insufficient documentation

## 2015-10-08 LAB — POCT URINALYSIS DIP (DEVICE)
Bilirubin Urine: NEGATIVE
Glucose, UA: NEGATIVE mg/dL
Hgb urine dipstick: NEGATIVE
Ketones, ur: NEGATIVE mg/dL
NITRITE: NEGATIVE
PROTEIN: NEGATIVE mg/dL
SPECIFIC GRAVITY, URINE: 1.015 (ref 1.005–1.030)
UROBILINOGEN UA: 1 mg/dL (ref 0.0–1.0)
pH: 6.5 (ref 5.0–8.0)

## 2015-10-08 NOTE — Progress Notes (Signed)
NST reviewed and reactive.  

## 2015-10-13 NOTE — L&D Delivery Note (Signed)
Patient is 35 y.o. [id number] 106w0d admitted for IOL for GDM A2-glyburide. She was induced with pitocin and AROM.   Delivery Note At 8:37 PM a viable female was delivered via Vaginal, Spontaneous Delivery (Presentation: Left Occiput Anterior).  APGAR: 8, 9; weight 3425g (7lb 8.8oz).   Placenta status: Intact, Spontaneous.  Cord: 3 vessels with the following complications: None.  Cord pH: not obtained  Anesthesia: None  Episiotomy: None Lacerations: None Suture Repair: n/a Est. Blood Loss (mL): 357  Mom to postpartum.  Baby to Couplet care / Skin to Skin.  Palma Holter 11/05/2015, 8:55 PM   Upon this provider's arrival patient was complete and pushing. She pushed with good maternal effort to deliver a healthy baby girl. Baby delivered without difficulty, was noted to have good tone and place on maternal abdomen for oral suctioning, drying and stimulation. Delayed cord clamping performed. Placenta delivered intact with 3V cord. Vaginal canal and perineum was inspected and and without lacerations; hemostatic. Pitocin was started and uterus massaged until bleeding slowed. Counts of sharps, instruments, and lap pads were all correct.   Palma Holter, MD PGY 1 Family Medicine   OB FELLOW DELIVERY ATTESTATION  I was gloved and present for the delivery in its entirety, and I agree with the above resident's note.    Silvano Bilis, MD 5:18 AM

## 2015-10-14 ENCOUNTER — Ambulatory Visit (INDEPENDENT_AMBULATORY_CARE_PROVIDER_SITE_OTHER): Payer: Self-pay | Admitting: Family Medicine

## 2015-10-14 VITALS — BP 94/54 | HR 90 | Wt 189.5 lb

## 2015-10-14 DIAGNOSIS — Z36 Encounter for antenatal screening of mother: Secondary | ICD-10-CM

## 2015-10-14 DIAGNOSIS — O24419 Gestational diabetes mellitus in pregnancy, unspecified control: Secondary | ICD-10-CM

## 2015-10-14 DIAGNOSIS — O0993 Supervision of high risk pregnancy, unspecified, third trimester: Secondary | ICD-10-CM

## 2015-10-14 DIAGNOSIS — O24414 Gestational diabetes mellitus in pregnancy, insulin controlled: Secondary | ICD-10-CM

## 2015-10-14 LAB — POCT URINALYSIS DIP (DEVICE)
BILIRUBIN URINE: NEGATIVE
Glucose, UA: NEGATIVE mg/dL
HGB URINE DIPSTICK: NEGATIVE
KETONES UR: NEGATIVE mg/dL
Leukocytes, UA: NEGATIVE
Nitrite: NEGATIVE
PH: 6.5 (ref 5.0–8.0)
Protein, ur: NEGATIVE mg/dL
Specific Gravity, Urine: 1.02 (ref 1.005–1.030)
Urobilinogen, UA: 0.2 mg/dL (ref 0.0–1.0)

## 2015-10-14 NOTE — Progress Notes (Signed)
Subjective:  Erika Potts is a 35 y.o. [id number] at 4139w6d being seen today for ongoing prenatal care.  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  Patient reports no complaints.   .  .  Movement: Present. Denies leaking of fluid.   The following portions of the patient's history were reviewed and updated as appropriate: allergies, current medications, past family history, past medical history, past social history, past surgical history and problem list. Problem list updated.  Objective:   Filed Vitals:   10/14/15 0852  BP: 94/54  Pulse: 90  Weight: 189 lb 8 oz (85.957 kg)    Fetal Status: Fetal Heart Rate (bpm): RNST   Movement: Present  Presentation: Transverse (head on maternal Rt, spine down)  General:  Alert, oriented and cooperative. Patient is in no acute distress.  Skin: Skin is warm and dry. No rash noted.   Cardiovascular: Normal heart rate noted  Respiratory: Normal respiratory effort, no problems with respiration noted  Abdomen: Soft, gravid, appropriate for gestational age. Pain/Pressure: Present     Pelvic:  Cervical exam deferred        Extremities: Normal range of motion.  Edema: None  Mental Status: Normal mood and affect. Normal behavior. Normal judgment and thought content.   Urinalysis: Urine Protein: Negative Urine Glucose: Negative NST reviewed and reactive. FBS 64-90 2 hour pp 74-140 (4 of 34 out of range) U/s 12/27 vtx, AFI 14, 6 lb 10 oz 86% Assessment and Plan:  Pregnancy: G5P4004 at 6339w6d  1. Gestational diabetes mellitus (GDM) in third trimester, gestational diabetes method of control unspecified Excellent control Cultures next week - Amniotic fluid index with NST - GC[facility]Chlamydia [facility]  2.  Supervision of high risk pregnancy in third trimester Continue prenatal care.   Preterm labor symptoms and general obstetric precautions including but not limited to vaginal bleeding, contractions, leaking of fluid and fetal movement were reviewed in detail with the patient. Please refer to After Visit Summary for other counseling recommendations.  Return in about 3 days (around 10/17/2015) for 2x/wk as scheduled.   Reva Boresanya S Sharlette Jansma, MD

## 2015-10-14 NOTE — Patient Instructions (Signed)
Gestational Diabetes Mellitus Gestational diabetes mellitus, often simply referred to as gestational diabetes, is a type of diabetes that some women develop during pregnancy. In gestational diabetes, the pancreas does not make enough insulin (a hormone), the cells are less responsive to the insulin that is made (insulin resistance), or both.Normally, insulin moves sugars from food into the tissue cells. The tissue cells use the sugars for energy. The lack of insulin or the lack of normal response to insulin causes excess sugars to build up in the blood instead of going into the tissue cells. As a result, high blood sugar (hyperglycemia) develops. The effect of high sugar (glucose) levels can cause many problems.  RISK FACTORS You have an increased chance of developing gestational diabetes if you have a family history of diabetes and also have one or more of the following risk factors:  A body mass index over 30 (obesity).  A previous pregnancy with gestational diabetes.  An older age at the time of pregnancy. If blood glucose levels are kept in the normal range during pregnancy, women can have a healthy pregnancy. If your blood glucose levels are not well controlled, there may be risks to you, your unborn baby (fetus), your labor and delivery, or your newborn baby.  SYMPTOMS  If symptoms are experienced, they are much like symptoms you would normally expect during pregnancy. The symptoms of gestational diabetes include:   Increased thirst (polydipsia).  Increased urination (polyuria).  Increased urination during the night (nocturia).  Weight loss. This weight loss may be rapid.  Frequent, recurring infections.  Tiredness (fatigue).  Weakness.  Vision changes, such as blurred vision.  Fruity smell to your breath.  Abdominal pain. DIAGNOSIS Diabetes is diagnosed when blood glucose levels are increased. Your blood glucose level may be checked by one or more of the following blood  tests:  A fasting blood glucose test. You will not be allowed to eat for at least 8 hours before a blood sample is taken.  A random blood glucose test. Your blood glucose is checked at any time of the day regardless of when you ate.  An oral glucose tolerance test (OGTT). Your blood glucose is measured after you have not eaten (fasted) for 1-3 hours and then after you drink a glucose-containing beverage. Since the hormones that cause insulin resistance are highest at about 24-28 weeks of a pregnancy, an OGTT is usually performed during that time. If you have risk factors, you may be screened for undiagnosed type 2 diabetes at your first prenatal visit. TREATMENT  Gestational diabetes should be managed first with diet and exercise. Medicines may be added only if they are needed.  You will need to take diabetes medicine or insulin daily to keep blood glucose levels in the desired range.  You will need to match insulin dosing with exercise and healthy food choices. If you have gestational diabetes, your treatment goal is to maintain the following blood glucose levels:  Before meals (preprandial): at or below 95 mg/dL.  After meals (postprandial):  One hour after a meal: at or below 140 mg/dL.  Two hours after a meal: at or below 120 mg/dL. If you have pre-existing type 1 or type 2 diabetes, your treatment goal is to maintain the following blood glucose levels:  Before meals, at bedtime, and overnight: 60-99 mg/dL.  After meals: peak of 100-129 mg/dL. HOME CARE INSTRUCTIONS   Have your hemoglobin A1c level checked twice a year.  Perform daily blood glucose monitoring as directed by   your health care provider. It is common to perform frequent blood glucose monitoring.  Monitor urine ketones when you are ill and as directed by your health care provider.  Take your diabetes medicine and insulin as directed by your health care provider to maintain your blood glucose level in the desired  range.  Never run out of diabetes medicine or insulin. It is needed every day.  Adjust insulin based on your intake of carbohydrates. Carbohydrates can raise blood glucose levels but need to be included in your diet. Carbohydrates provide vitamins, minerals, and fiber which are an essential part of a healthy diet. Carbohydrates are found in fruits, vegetables, whole grains, dairy products, legumes, and foods containing added sugars.  Eat healthy foods. Alternate 3 meals with 3 snacks.  Maintain a healthy weight gain. The usual total expected weight gain varies according to your prepregnancy body mass index (BMI).  Carry a medical alert card or wear your medical alert jewelry.  Carry a 15-gram carbohydrate snack with you at all times to treat low blood glucose (hypoglycemia). Some examples of 15-gram carbohydrate snacks include:  Glucose tablets, 3 or 4.  Glucose gel, 15-gram tube.  Raisins, 2 tablespoons (24 g).  Jelly beans, 6.  Animal crackers, 8.  Fruit juice, regular soda, or low-fat milk, 4 ounces (120 mL).  Gummy treats, 9.  Recognize hypoglycemia. Hypoglycemia during pregnancy occurs with blood glucose levels of 60 mg/dL and below. The risk for hypoglycemia increases when fasting or skipping meals, during or after intense exercise, and during sleep. Hypoglycemia symptoms can include:  Tremors or shakes.  Decreased ability to concentrate.  Sweating.  Increased heart rate.  Headache.  Dry mouth.  Hunger.  Irritability.  Anxiety.  Restless sleep.  Altered speech or coordination.  Confusion.  Treat hypoglycemia promptly. If you are alert and able to safely swallow, follow the 15:15 rule:  Take 15-20 grams of rapid-acting glucose or carbohydrate. Rapid-acting options include glucose gel, glucose tablets, or 4 ounces (120 mL) of fruit juice, regular soda, or low-fat milk.  Check your blood glucose level 15 minutes after taking the glucose.  Take 15-20  grams more of glucose if the repeat blood glucose level is still 70 mg/dL or below.  Eat a meal or snack within 1 hour once blood glucose levels return to normal.  Be alert to polyuria (excess urination) and polydipsia (excess thirst) which are early signs of hyperglycemia. An early awareness of hyperglycemia allows for prompt treatment. Treat hyperglycemia as directed by your health care provider.  Engage in at least 30 minutes of physical activity a day or as directed by your health care provider. Ten minutes of physical activity timed 30 minutes after each meal is encouraged to control postprandial blood glucose levels.  Adjust your insulin dosing and food intake as needed if you start a new exercise or sport.  Follow your sick-day plan at any time you are unable to eat or drink as usual.  Avoid tobacco and alcohol use.  Keep all follow-up visits as directed by your health care provider.  Follow the advice of your health care provider regarding your prenatal and post-delivery (postpartum) appointments, meal planning, exercise, medicines, vitamins, blood tests, other medical tests, and physical activities.  Perform daily skin and foot care. Examine your skin and feet daily for cuts, bruises, redness, nail problems, bleeding, blisters, or sores.  Brush your teeth and gums at least twice a day and floss at least once a day. Follow up with your dentist   regularly.  Schedule an eye exam during the first trimester of your pregnancy or as directed by your health care provider.  Share your diabetes management plan with your workplace or school.  Stay up-to-date with immunizations.  Learn to manage stress.  Obtain ongoing diabetes education and support as needed.  Learn about and consider breastfeeding your baby.  You should have your blood sugar level checked 6-12 weeks after delivery. This is done with an oral glucose tolerance test (OGTT). SEEK MEDICAL CARE IF:   You are unable to  eat food or drink fluids for more than 6 hours.  You have nausea and vomiting for more than 6 hours.  You have a blood glucose level of 200 mg/dL and you have ketones in your urine.  There is a change in mental status.  You develop vision problems.  You have a persistent headache.  You have upper abdominal pain or discomfort.  You develop an additional serious illness.  You have diarrhea for more than 6 hours.  You have been sick or have had a fever for a couple of days and are not getting better. SEEK IMMEDIATE MEDICAL CARE IF:   You have difficulty breathing.  You no longer feel the baby moving.  You are bleeding or have discharge from your vagina.  You start having premature contractions or labor. MAKE SURE YOU:  Understand these instructions.  Will watch your condition.  Will get help right away if you are not doing well or get worse.   This information is not intended to replace advice given to you by your health care provider. Make sure you discuss any questions you have with your health care provider.   Document Released: 01/04/2001 Document Revised: 10/19/2014 Document Reviewed: 04/26/2012 Elsevier Interactive Patient Education 2016 [employer]  Breastfeeding Deciding to breastfeed is one of the best choices you can make for you and your baby. A change in hormones during pregnancy causes your breast tissue to grow and increases the number and size of your milk ducts. These hormones also allow proteins, sugars, and fats from your blood supply to make breast milk in your milk-producing glands. Hormones prevent breast milk from being released before your baby is born as well as prompt milk flow after birth. Once breastfeeding has begun, thoughts of your baby, as well as his or her sucking or crying, can stimulate the release of milk from your milk-producing glands.  BENEFITS OF BREASTFEEDING For Your Baby  Your first milk (colostrum) helps your baby's digestive  system function better.  There are antibodies in your milk that help your baby fight off infections.  Your baby has a lower incidence of asthma, allergies, and sudden infant death syndrome.  The nutrients in breast milk are better for your baby than infant formulas and are designed uniquely for your baby's needs.  Breast milk improves your baby's brain development.  Your baby is less likely to develop other conditions, such as childhood obesity, asthma, or type 2 diabetes mellitus. For You  Breastfeeding helps to create a very special bond between you and your baby.  Breastfeeding is convenient. Breast milk is always available at the correct temperature and costs nothing.  Breastfeeding helps to burn calories and helps you lose the weight gained during pregnancy.  Breastfeeding makes your uterus contract to its prepregnancy size faster and slows bleeding (lochia) after you give birth.   Breastfeeding helps to lower your risk of developing type 2 diabetes mellitus, osteoporosis, and breast or ovarian cancer   later in life. SIGNS THAT YOUR BABY IS HUNGRY Early Signs of Hunger  Increased alertness or activity.  Stretching.  Movement of the head from side to side.  Movement of the head and opening of the mouth when the corner of the mouth or cheek is stroked (rooting).  Increased sucking sounds, smacking lips, cooing, sighing, or squeaking.  Hand-to-mouth movements.  Increased sucking of fingers or hands. Late Signs of Hunger  Fussing.  Intermittent crying. Extreme Signs of Hunger Signs of extreme hunger will require calming and consoling before your baby will be able to breastfeed successfully. Do not wait for the following signs of extreme hunger to occur before you initiate breastfeeding:  Restlessness.  A loud, strong cry.  Screaming. BREASTFEEDING BASICS Breastfeeding Initiation  Find a comfortable place to sit or lie down, with your neck and back well  supported.  Place a pillow or rolled up blanket under your baby to bring him or her to the level of your breast (if you are seated). Nursing pillows are specially designed to help support your arms and your baby while you breastfeed.  Make sure that your baby's abdomen is facing your abdomen.  Gently massage your breast. With your fingertips, massage from your chest wall toward your nipple in a circular motion. This encourages milk flow. You may need to continue this action during the feeding if your milk flows slowly.  Support your breast with 4 fingers underneath and your thumb above your nipple. Make sure your fingers are well away from your nipple and your baby's mouth.  Stroke your baby's lips gently with your finger or nipple.  When your baby's mouth is open wide enough, quickly bring your baby to your breast, placing your entire nipple and as much of the colored area around your nipple (areola) as possible into your baby's mouth.  More areola should be visible above your baby's upper lip than below the lower lip.  Your baby's tongue should be between his or her lower gum and your breast.  Ensure that your baby's mouth is correctly positioned around your nipple (latched). Your baby's lips should create a seal on your breast and be turned out (everted).  It is common for your baby to suck about 2-3 minutes in order to start the flow of breast milk. Latching Teaching your baby how to latch on to your breast properly is very important. An improper latch can cause nipple pain and decreased milk supply for you and poor weight gain in your baby. Also, if your baby is not latched onto your nipple properly, he or she may swallow some air during feeding. This can make your baby fussy. Burping your baby when you switch breasts during the feeding can help to get rid of the air. However, teaching your baby to latch on properly is still the best way to prevent fussiness from swallowing air while  breastfeeding. Signs that your baby has successfully latched on to your nipple:  Silent tugging or silent sucking, without causing you pain.  Swallowing heard between every 3-4 sucks.  Muscle movement above and in front of his or her ears while sucking. Signs that your baby has not successfully latched on to nipple:  Sucking sounds or smacking sounds from your baby while breastfeeding.  Nipple pain. If you think your baby has not latched on correctly, slip your finger into the corner of your baby's mouth to break the suction and place it between your baby's gums. Attempt breastfeeding initiation again. Signs   of Successful Breastfeeding Signs from your baby:  A gradual decrease in the number of sucks or complete cessation of sucking.  Falling asleep.  Relaxation of his or her body.  Retention of a small amount of milk in his or her mouth.  Letting go of your breast by himself or herself. Signs from you:  Breasts that have increased in firmness, weight, and size 1-3 hours after feeding.  Breasts that are softer immediately after breastfeeding.  Increased milk volume, as well as a change in milk consistency and color by the fifth day of breastfeeding.  Nipples that are not sore, cracked, or bleeding. Signs That Your Baby is Getting Enough Milk  Wetting at least 3 diapers in a 24-hour period. The urine should be clear and pale yellow by age 5 days.  At least 3 stools in a 24-hour period by age 5 days. The stool should be soft and yellow.  At least 3 stools in a 24-hour period by age 7 days. The stool should be seedy and yellow.  No loss of weight greater than 10% of birth weight during the first 3 days of age.  Average weight gain of 4-7 ounces (113-198 g) per week after age 4 days.  Consistent daily weight gain by age 5 days, without weight loss after the age of 2 weeks. After a feeding, your baby may spit up a small amount. This is common. BREASTFEEDING FREQUENCY AND  DURATION Frequent feeding will help you make more milk and can prevent sore nipples and breast engorgement. Breastfeed when you feel the need to reduce the fullness of your breasts or when your baby shows signs of hunger. This is called "breastfeeding on demand." Avoid introducing a pacifier to your baby while you are working to establish breastfeeding (the first 4-6 weeks after your baby is born). After this time you may choose to use a pacifier. Research has shown that pacifier use during the first year of a baby's life decreases the risk of sudden infant death syndrome (SIDS). Allow your baby to feed on each breast as long as he or she wants. Breastfeed until your baby is finished feeding. When your baby unlatches or falls asleep while feeding from the first breast, offer the second breast. Because newborns are often sleepy in the first few weeks of life, you may need to awaken your baby to get him or her to feed. Breastfeeding times will vary from baby to baby. However, the following rules can serve as a guide to help you ensure that your baby is properly fed:  Newborns (babies 4 weeks of age or younger) may breastfeed every 1-3 hours.  Newborns should not go longer than 3 hours during the day or 5 hours during the night without breastfeeding.  You should breastfeed your baby a minimum of 8 times in a 24-hour period until you begin to introduce solid foods to your baby at around 6 months of age. BREAST MILK PUMPING Pumping and storing breast milk allows you to ensure that your baby is exclusively fed your breast milk, even at times when you are unable to breastfeed. This is especially important if you are going back to work while you are still breastfeeding or when you are not able to be present during feedings. Your lactation consultant can give you guidelines on how long it is safe to store breast milk. A breast pump is a machine that allows you to pump milk from your breast into a sterile bottle.  The pumped   breast milk can then be stored in a refrigerator or freezer. Some breast pumps are operated by hand, while others use electricity. Ask your lactation consultant which type will work best for you. Breast pumps can be purchased, but some hospitals and breastfeeding support groups lease breast pumps on a monthly basis. A lactation consultant can teach you how to hand express breast milk, if you prefer not to use a pump. CARING FOR YOUR BREASTS WHILE YOU BREASTFEED Nipples can become dry, cracked, and sore while breastfeeding. The following recommendations can help keep your breasts moisturized and healthy:  Avoid using soap on your nipples.  Wear a supportive bra. Although not required, special nursing bras and tank tops are designed to allow access to your breasts for breastfeeding without taking off your entire bra or top. Avoid wearing underwire-style bras or extremely tight bras.  Air dry your nipples for 3-4minutes after each feeding.  Use only cotton bra pads to absorb leaked breast milk. Leaking of breast milk between feedings is normal.  Use lanolin on your nipples after breastfeeding. Lanolin helps to maintain your skin's normal moisture barrier. If you use pure lanolin, you do not need to wash it off before feeding your baby again. Pure lanolin is not toxic to your baby. You may also hand express a few drops of breast milk and gently massage that milk into your nipples and allow the milk to air dry. In the first few weeks after giving birth, some women experience extremely full breasts (engorgement). Engorgement can make your breasts feel heavy, warm, and tender to the touch. Engorgement peaks within 3-5 days after you give birth. The following recommendations can help ease engorgement:  Completely empty your breasts while breastfeeding or pumping. You may want to start by applying warm, moist heat (in the shower or with warm water-soaked hand towels) just before feeding or pumping.  This increases circulation and helps the milk flow. If your baby does not completely empty your breasts while breastfeeding, pump any extra milk after he or she is finished.  Wear a snug bra (nursing or regular) or tank top for 1-2 days to signal your body to slightly decrease milk production.  Apply ice packs to your breasts, unless this is too uncomfortable for you.  Make sure that your baby is latched on and positioned properly while breastfeeding. If engorgement persists after 48 hours of following these recommendations, contact your health care provider or a lactation consultant. OVERALL HEALTH CARE RECOMMENDATIONS WHILE BREASTFEEDING  Eat healthy foods. Alternate between meals and snacks, eating 3 of each per day. Because what you eat affects your breast milk, some of the foods may make your baby more irritable than usual. Avoid eating these foods if you are sure that they are negatively affecting your baby.  Drink milk, fruit juice, and water to satisfy your thirst (about 10 glasses a day).  Rest often, relax, and continue to take your prenatal vitamins to prevent fatigue, stress, and anemia.  Continue breast self-awareness checks.  Avoid chewing and smoking tobacco. Chemicals from cigarettes that pass into breast milk and exposure to secondhand smoke may harm your baby.  Avoid alcohol and drug use, including marijuana. Some medicines that may be harmful to your baby can pass through breast milk. It is important to ask your health care provider before taking any medicine, including all over-the-counter and prescription medicine as well as vitamin and herbal supplements. It is possible to become pregnant while breastfeeding. If birth control is desired, ask   your health care provider about options that will be safe for your baby. SEEK MEDICAL CARE IF:  You feel like you want to stop breastfeeding or have become frustrated with breastfeeding.  You have painful breasts or  nipples.  Your nipples are cracked or bleeding.  Your breasts are red, tender, or warm.  You have a swollen area on either breast.  You have a fever or chills.  You have nausea or vomiting.  You have drainage other than breast milk from your nipples.  Your breasts do not become full before feedings by the fifth day after you give birth.  You feel sad and depressed.  Your baby is too sleepy to eat well.  Your baby is having trouble sleeping.   Your baby is wetting less than 3 diapers in a 24-hour period.  Your baby has less than 3 stools in a 24-hour period.  Your baby's skin or the white part of his or her eyes becomes yellow.   Your baby is not gaining weight by 5 days of age. SEEK IMMEDIATE MEDICAL CARE IF:  Your baby is overly tired (lethargic) and does not want to wake up and feed.  Your baby develops an unexplained fever.   This information is not intended to replace advice given to you by your health care provider. Make sure you discuss any questions you have with your health care provider.   Document Released: 09/28/2005 Document Revised: 06/19/2015 Document Reviewed: 03/22/2013 Elsevier Interactive Patient Education 2016 [employer]  

## 2015-10-17 ENCOUNTER — Ambulatory Visit (INDEPENDENT_AMBULATORY_CARE_PROVIDER_SITE_OTHER): Payer: Self-pay | Admitting: *Deleted

## 2015-10-17 VITALS — BP 106/54 | HR 90

## 2015-10-17 DIAGNOSIS — O24414 Gestational diabetes mellitus in pregnancy, insulin controlled: Secondary | ICD-10-CM

## 2015-10-17 NOTE — Progress Notes (Signed)
NST reviewed and reactive.  

## 2015-10-21 ENCOUNTER — Other Ambulatory Visit: Payer: Self-pay

## 2015-10-24 ENCOUNTER — Ambulatory Visit (INDEPENDENT_AMBULATORY_CARE_PROVIDER_SITE_OTHER): Payer: Self-pay | Admitting: *Deleted

## 2015-10-24 VITALS — BP 103/64 | HR 90

## 2015-10-24 DIAGNOSIS — O24419 Gestational diabetes mellitus in pregnancy, unspecified control: Secondary | ICD-10-CM

## 2015-10-24 NOTE — Progress Notes (Signed)
IOL scheduled 1/24 @ 0730.

## 2015-10-24 NOTE — Progress Notes (Signed)
NST reviewed and reactive.  Ilisa Hayworth L. Harraway-Smith, M.D., FACOG    

## 2015-10-28 ENCOUNTER — Ambulatory Visit (INDEPENDENT_AMBULATORY_CARE_PROVIDER_SITE_OTHER): Payer: Self-pay | Admitting: Obstetrics & Gynecology

## 2015-10-28 VITALS — BP 102/57 | HR 92 | Wt 188.8 lb

## 2015-10-28 DIAGNOSIS — O09293 Supervision of pregnancy with other poor reproductive or obstetric history, third trimester: Secondary | ICD-10-CM

## 2015-10-28 DIAGNOSIS — Z36 Encounter for antenatal screening of mother: Secondary | ICD-10-CM

## 2015-10-28 DIAGNOSIS — O24419 Gestational diabetes mellitus in pregnancy, unspecified control: Secondary | ICD-10-CM

## 2015-10-28 DIAGNOSIS — O0993 Supervision of high risk pregnancy, unspecified, third trimester: Secondary | ICD-10-CM

## 2015-10-28 LAB — POCT URINALYSIS DIP (DEVICE)
GLUCOSE, UA: 100 mg/dL — AB
HGB URINE DIPSTICK: NEGATIVE
KETONES UR: 40 mg/dL — AB
LEUKOCYTES UA: NEGATIVE
NITRITE: NEGATIVE
Protein, ur: NEGATIVE mg/dL
SPECIFIC GRAVITY, URINE: 1.025 (ref 1.005–1.030)
Urobilinogen, UA: 0.2 mg/dL (ref 0.0–1.0)
pH: 6 (ref 5.0–8.0)

## 2015-10-28 NOTE — Progress Notes (Signed)
Subjective:  Erika Potts is a 35 y.o. W2N5621G5P4004 at 9053w6d being seen today for ongoing prenatal care.  She is currently monitored for the following issues for this high-risk pregnancy and has Urinary tract colonization by group B Streptococcus complicating pregnancy; A2GDM (Glyburide); Supervision of high risk pregnancy in third trimester; and History of shoulder dystocia in prior pregnancy, currently pregnant in third trimester on her problem list.  Patient reports no complaints.  Contractions: Irregular. Vag. Bleeding: None.  Movement: Present. Denies leaking of fluid.   The following portions of the patient's history were reviewed and updated as appropriate: allergies, current medications, past family history, past medical history, past social history, past surgical history and problem list. Problem list updated.  Objective:   Filed Vitals:   10/28/15 0915  BP: 102/57  Pulse: 92  Weight: 188 lb 12.8 oz (85.639 kg)    Fetal Status: Fetal Heart Rate (bpm): NST   Movement: Present  Presentation: Vertex  General:  Alert, oriented and cooperative. Patient is in no acute distress.  Skin: Skin is warm and dry. No rash noted.   Cardiovascular: Normal heart rate noted  Respiratory: Normal respiratory effort, no problems with respiration noted  Abdomen: Soft, gravid, appropriate for gestational age. Pain/Pressure: Present     Pelvic: Vag. Bleeding: None     Cervical exam deferred        Extremities: Normal range of motion.  Edema: None  Mental Status: Normal mood and affect. Normal behavior. Normal judgment and thought content.   Urinalysis: Urine Protein: Negative Urine Glucose: 1+  Assessment and Plan:  Pregnancy: G5P4004 at 4053w6d  1. Gestational diabetes mellitus in third trimester, unspecified diabetic control -CBGs well controlled.  4 mildly elevated PP this week - Amniotic fluid index with NST - US MFM OB FOLLOW UP; Future -Has induction scheduled at 39 weeks.  US pending  for growth.  2. Supervision of high risk pregnancy in third trimester -GBS in urine  3. History of shoulder dystocia in prior pregnancy, currently pregnant in third trimester -pt feels smaller this pregnancy; will get US (history of 30 second shoulder dystocia 2013--delivery note on chart) - US MFM OB FOLLOW UP; Future  Term labor symptoms and general obstetric precautions including but not limited to vaginal bleeding, contractions, leaking of fluid and fetal movement were reviewed in detail with the patient. Please refer to After Visit Summary for other counseling recommendations.  Return for NST as scheduled.   Lesly DukesKelly H Leggett, MD

## 2015-10-28 NOTE — Progress Notes (Signed)
Pt states she is only taking glyburide once daily and not always at the same time.  IOL scheduled 1/24.  Breastfeeding tip of the week reviewed.

## 2015-10-31 ENCOUNTER — Ambulatory Visit (INDEPENDENT_AMBULATORY_CARE_PROVIDER_SITE_OTHER): Payer: Self-pay | Admitting: General Practice

## 2015-10-31 VITALS — BP 119/63 | HR 83

## 2015-10-31 DIAGNOSIS — O24414 Gestational diabetes mellitus in pregnancy, insulin controlled: Secondary | ICD-10-CM

## 2015-11-01 ENCOUNTER — Telehealth (HOSPITAL_COMMUNITY): Payer: Self-pay | Admitting: *Deleted

## 2015-11-01 NOTE — Telephone Encounter (Signed)
Preadmission screen Interpreter number [phone]

## 2015-11-04 ENCOUNTER — Ambulatory Visit (HOSPITAL_COMMUNITY)
Admission: RE | Admit: 2015-11-04 | Discharge: 2015-11-04 | Disposition: A | Discharge status: Home / Self Care | Payer: Self-pay | Source: Ambulatory Visit | Attending: Obstetrics & Gynecology | Admitting: Obstetrics & Gynecology

## 2015-11-04 DIAGNOSIS — O24415 Gestational diabetes mellitus in pregnancy, controlled by oral hypoglycemic drugs: Secondary | ICD-10-CM | POA: Insufficient documentation

## 2015-11-04 DIAGNOSIS — Z3A38 38 weeks gestation of pregnancy: Secondary | ICD-10-CM | POA: Insufficient documentation

## 2015-11-04 DIAGNOSIS — O24419 Gestational diabetes mellitus in pregnancy, unspecified control: Secondary | ICD-10-CM

## 2015-11-04 DIAGNOSIS — O09293 Supervision of pregnancy with other poor reproductive or obstetric history, third trimester: Secondary | ICD-10-CM

## 2015-11-05 ENCOUNTER — Encounter (HOSPITAL_COMMUNITY): Payer: Self-pay

## 2015-11-05 ENCOUNTER — Inpatient Hospital Stay (HOSPITAL_COMMUNITY)
Admission: RE | Admit: 2015-11-05 | Discharge: 2015-11-07 | DRG: 775 | Disposition: A | Discharge status: Home / Self Care | Payer: Medicaid Other | Source: Ambulatory Visit | Attending: Obstetrics and Gynecology | Admitting: Obstetrics and Gynecology

## 2015-11-05 DIAGNOSIS — O99824 Streptococcus B carrier state complicating childbirth: Secondary | ICD-10-CM | POA: Diagnosis present

## 2015-11-05 DIAGNOSIS — E119 Type 2 diabetes mellitus without complications: Secondary | ICD-10-CM

## 2015-11-05 DIAGNOSIS — O24425 Gestational diabetes mellitus in childbirth, controlled by oral hypoglycemic drugs: Principal | ICD-10-CM | POA: Diagnosis present

## 2015-11-05 DIAGNOSIS — Z7984 Long term (current) use of oral hypoglycemic drugs: Secondary | ICD-10-CM

## 2015-11-05 DIAGNOSIS — O2412 Pre-existing diabetes mellitus, type 2, in childbirth: Secondary | ICD-10-CM

## 2015-11-05 DIAGNOSIS — Z3A39 39 weeks gestation of pregnancy: Secondary | ICD-10-CM | POA: Diagnosis not present

## 2015-11-05 DIAGNOSIS — O24419 Gestational diabetes mellitus in pregnancy, unspecified control: Secondary | ICD-10-CM

## 2015-11-05 LAB — CBC
HCT: 34.6 % — ABNORMAL LOW (ref 36.0–46.0)
HEMOGLOBIN: 11.7 g/dL — AB (ref 12.0–15.0)
MCH: 25.5 pg — ABNORMAL LOW (ref 26.0–34.0)
MCHC: 33.8 g/dL (ref 30.0–36.0)
MCV: 75.5 fL — ABNORMAL LOW (ref 78.0–100.0)
Platelets: 201 10*3/uL (ref 150–400)
RBC: 4.58 MIL/uL (ref 3.87–5.11)
RDW: 15.9 % — ABNORMAL HIGH (ref 11.5–15.5)
WBC: 7.5 10*3/uL (ref 4.0–10.5)

## 2015-11-05 LAB — TYPE AND SCREEN
ABO/RH(D): B POS
Antibody Screen: NEGATIVE

## 2015-11-05 LAB — GLUCOSE, CAPILLARY
GLUCOSE-CAPILLARY: 145 mg/dL — AB (ref 65–99)
Glucose-Capillary: 69 mg/dL (ref 65–99)
Glucose-Capillary: 95 mg/dL (ref 65–99)

## 2015-11-05 LAB — RPR: RPR: NONREACTIVE

## 2015-11-05 MED ORDER — CITRIC ACID-SODIUM CITRATE 334-500 MG/5ML PO SOLN: 30.0000 mL | ORAL | Status: DC | PRN

## 2015-11-05 MED ORDER — LACTATED RINGERS IV SOLN
INTRAVENOUS | Status: DC
  Administered 2015-11-05 (×2): via INTRAVENOUS

## 2015-11-05 MED ORDER — ZOLPIDEM TARTRATE 5 MG PO TABS: 5.0000 mg | ORAL_TABLET | Freq: Every evening | ORAL | Status: DC | PRN

## 2015-11-05 MED ORDER — DIPHENHYDRAMINE HCL 25 MG PO CAPS: 25.0000 mg | ORAL_CAPSULE | Freq: Four times a day (QID) | ORAL | Status: DC | PRN

## 2015-11-05 MED ORDER — ONDANSETRON HCL 4 MG PO TABS: 4.0000 mg | ORAL_TABLET | ORAL | Status: DC | PRN

## 2015-11-05 MED ORDER — TETANUS-DIPHTH-ACELL PERTUSSIS 5-2.5-18.5 LF-MCG/0.5 IM SUSP: 0.5000 mL | Freq: Once | INTRAMUSCULAR | Status: DC

## 2015-11-05 MED ORDER — IBUPROFEN 600 MG PO TABS
600.0000 mg | ORAL_TABLET | Freq: Four times a day (QID) | ORAL | Status: DC
  Administered 2015-11-05 – 2015-11-07 (×6): 600 mg via ORAL
  Filled 2015-11-05 (×6): qty 1

## 2015-11-05 MED ORDER — SIMETHICONE 80 MG PO CHEW: 80.0000 mg | CHEWABLE_TABLET | ORAL | Status: DC | PRN

## 2015-11-05 MED ORDER — TERBUTALINE SULFATE 1 MG/ML IJ SOLN
0.2500 mg | Freq: Once | INTRAMUSCULAR | Status: DC | PRN
  Filled 2015-11-05: qty 1

## 2015-11-05 MED ORDER — ACETAMINOPHEN 325 MG PO TABS
650.0000 mg | ORAL_TABLET | ORAL | Status: DC | PRN
  Administered 2015-11-05: 650 mg via ORAL
  Filled 2015-11-05: qty 2

## 2015-11-05 MED ORDER — ONDANSETRON HCL 4 MG/2ML IJ SOLN: 4.0000 mg | INTRAMUSCULAR | Status: DC | PRN

## 2015-11-05 MED ORDER — WITCH HAZEL-GLYCERIN EX PADS: 1.0000 "application " | MEDICATED_PAD | CUTANEOUS | Status: DC | PRN

## 2015-11-05 MED ORDER — LACTATED RINGERS IV SOLN: 500.0000 mL | INTRAVENOUS | Status: DC | PRN

## 2015-11-05 MED ORDER — ONDANSETRON HCL 4 MG/2ML IJ SOLN: 4.0000 mg | Freq: Four times a day (QID) | INTRAMUSCULAR | Status: DC | PRN

## 2015-11-05 MED ORDER — ACETAMINOPHEN 325 MG PO TABS: 650.0000 mg | ORAL_TABLET | ORAL | Status: DC | PRN

## 2015-11-05 MED ORDER — PENICILLIN G POTASSIUM 5000000 UNITS IJ SOLR
2.5000 10*6.[IU] | INTRAVENOUS | Status: DC
  Administered 2015-11-05 (×2): 2.5 10*6.[IU] via INTRAVENOUS
  Filled 2015-11-05 (×5): qty 2.5

## 2015-11-05 MED ORDER — OXYTOCIN BOLUS FROM INFUSION
500.0000 mL | INTRAVENOUS | Status: DC
  Administered 2015-11-05: 500 mL via INTRAVENOUS

## 2015-11-05 MED ORDER — DEXTROSE 5 % IV SOLN
5.0000 10*6.[IU] | Freq: Once | INTRAVENOUS | Status: AC
  Administered 2015-11-05: 5 10*6.[IU] via INTRAVENOUS
  Filled 2015-11-05: qty 5

## 2015-11-05 MED ORDER — FENTANYL CITRATE (PF) 100 MCG/2ML IJ SOLN
100.0000 ug | INTRAMUSCULAR | Status: DC | PRN
  Administered 2015-11-05: 100 ug via INTRAVENOUS
  Filled 2015-11-05 (×2): qty 2

## 2015-11-05 MED ORDER — PRENATAL MULTIVITAMIN CH
1.0000 | ORAL_TABLET | Freq: Every day | ORAL | Status: DC
  Administered 2015-11-06: 1 via ORAL
  Filled 2015-11-05: qty 1

## 2015-11-05 MED ORDER — BENZOCAINE-MENTHOL 20-0.5 % EX AERO: 1.0000 "application " | INHALATION_SPRAY | CUTANEOUS | Status: DC | PRN

## 2015-11-05 MED ORDER — LIDOCAINE HCL (PF) 1 % IJ SOLN
30.0000 mL | INTRAMUSCULAR | Status: DC | PRN
  Filled 2015-11-05: qty 30

## 2015-11-05 MED ORDER — LANOLIN HYDROUS EX OINT: TOPICAL_OINTMENT | CUTANEOUS | Status: DC | PRN

## 2015-11-05 MED ORDER — OXYTOCIN 10 UNIT/ML IJ SOLN
2.5000 [IU]/h | INTRAVENOUS | Status: DC
  Administered 2015-11-05: 21:00:00 via INTRAVENOUS
  Filled 2015-11-05: qty 4

## 2015-11-05 MED ORDER — DIBUCAINE 1 % RE OINT: 1.0000 "application " | TOPICAL_OINTMENT | RECTAL | Status: DC | PRN

## 2015-11-05 MED ORDER — OXYTOCIN 10 UNIT/ML IJ SOLN
1.0000 m[IU]/min | INTRAVENOUS | Status: DC
  Administered 2015-11-05: 2 m[IU]/min via INTRAVENOUS

## 2015-11-05 MED ORDER — SENNOSIDES-DOCUSATE SODIUM 8.6-50 MG PO TABS
2.0000 | ORAL_TABLET | ORAL | Status: DC
  Administered 2015-11-05 – 2015-11-06 (×2): 2 via ORAL
  Filled 2015-11-05 (×2): qty 2

## 2015-11-05 NOTE — Progress Notes (Signed)
Labor Progress Note Sonnet Rizor is a 35 y.o. Z6X0960 at [redacted]w[redacted]d presented for IOL 2.2 to A2GDM S: feeling strong ctx.   O:  BP 115/65 mmHg  Pulse 70  Temp(Src) 98.5 F (36.9 C) (Oral)  Resp 16  Ht  (1.626 m)  Wt 189 lb (85.73 kg)  BMI 32.43 kg/m2  LMP 02/05/2015 (Exact Date) EFM: 120/mod/+accels, no decels  CVE: Dilation: 5 Effacement (%): 80 Cervical Position: Anterior Station: -3 Presentation: Vertex (With hand at top of head) Exam by:: Lin Hackmann MD  AROM performed with large amt of fluid. Fundal pressure was held during ROM. Fetal head at the top of head, unable to reduce. Assured no cord present.   A&P: 35 y.o. A5W0981 [redacted]w[redacted]d here for IOL 2/2 A2GDM #Labor: AROM performed. Continue pitocin, IUPC placed #Pain: Fentanyl IV prn #FWB: Cat I #GBS: GBS POS, PCN  Federico Flake, MD 6:20 PM

## 2015-11-05 NOTE — Lactation Note (Signed)
This note was copied from the chart of Erika Potts. Lactation Consultation Note  Patient Name: Erika Laasia Arcos ZOXWR'U Date: 11/05/2015 Reason for consult: Initial assessment Experienced bf mom, she bf all for of her older children 14 months each with no issues. Baby at 2 hr of life and mom reports that she is feeding well. Denies breast or nipple pain. No concerns voiced. Discussed baby behavior, feeding frequency, baby belly size, voids, wt loss, breast changes, and nipple care. Given lactation handouts. Aware of OP services and support group. She will call as needed.     Maternal Data Has patient been taught Hand Expression?: Yes Does the patient have breastfeeding experience prior to this delivery?: Yes  Feeding Feeding Type: Breast Fed Length of feed: 25 min  LATCH Score/Interventions                      Lactation Tools Discussed/Used WIC Program: Yes   Consult Status Consult Status: Follow-up Date: 11/06/15 Follow-up type: In-patient    Rulon Eisenmenger 11/05/2015, 11:08 PM

## 2015-11-05 NOTE — H&P (Signed)
LABOR ADMISSION HISTORY AND PHYSICAL  Erika Potts is a 35 y.o. female Z6X0960 with IUP at [redacted]w[redacted]d presenting for IOL for A2GDM on glyburide.  Pt has h/o shoulder dystocia in prior pregnancy. She reports +FM, intermittent contractions.  No LOF, no VB, no blurry vision, headaches or peripheral edema, and RUQ pain.  She plans on breastfeeding. She requests IUD for birth control.  Dating: By LMP c/w 24wks u/s --->  Estimated Date of Delivery: 11/12/15  Sono:    , CWD, normal anatomy, cephalic presentation, 3822g, >45% EFW  Prenatal History/Complications: A2DM (glyburide) GBS in urine  H/o shoulder dystocia   Past Medical History: Past Medical History  Diagnosis Date  . Gestational diabetes   . Abnormal Pap smear     ASCUS 2007    Past Surgical History: Past Surgical History  Procedure Laterality Date  . No past surgeries      Obstetrical History: OB History    Gravida Para Term Preterm AB TAB SAB Ectopic Multiple Living   0 0 0 0 0 0 4      Social History: Social History   Social History  . Marital Status: Married    Spouse Name: N/A  . Number of Children: N/A  . Years of Education: N/A   Social History Main Topics  . Smoking status: Never Smoker   . Smokeless tobacco: Never Used  . Alcohol Use: No  . Drug Use: No  . Sexual Activity: Yes   Other Topics Concern  . None   Social History Narrative    Family History: Family History  Problem Relation Age of Onset  . Asthma Son     Allergies: No Known Allergies  Prescriptions prior to admission  Medication Sig Dispense Refill Last Dose  . Prenatal Vit-Fe Fumarate-FA (MULTIVITAMIN-PRENATAL) 27-0.8 MG TABS tablet Take 1 tablet by mouth daily at 12 noon.   11/04/2015 at Unknown time  . glyBURIDE (DIABETA) 2.5 MG tablet Take 1 tablet (2.5 mg total) by mouth 2 (two) times daily with a meal. (Patient taking differently: Take 2.5 mg by mouth 2 (two) times daily with a meal. Pt takes only once  daily) 60 tablet 3 11/03/2015     Review of Systems   All systems reviewed and negative except as stated in HPI  BP 126/72 mmHg  Pulse 99  Temp(Src) 98.3 F (36.8 C) (Oral)  Resp 16  Ht  (1.626 m)  Wt 85.73 kg (189 lb)  BMI 32.43 kg/m2  LMP 02/05/2015 (Exact Date) General appearance: alert, cooperative and appears stated age Lungs: clear to auscultation bilaterally Heart: regular rate and rhythm Abdomen: soft, non-tender; bowel sounds normal Pelvic: adequate, CE below  Extremities: Homans sign is negative, no sign of DVT, edema DTR's nl Presentation: cephalic Fetal monitoringBaseline: 130s bpm, Variability: Good {> 6 bpm) and Accelerations: Reactive Uterine activityFrequency: Every 8 minutes Dilation: 3 Effacement (%): 80 Station: -2 Exam by:: Heckart   Prenatal labs: ABO, Rh: --/--/B POS (01/24 4098) Antibody: PENDING (01/24 0835) negative Rubella: immune RPR: NON REAC (11/07 1005)  HBsAg:   negative HIV: NONREACTIVE (11/07 1005)  GBS: Positive (09/22 0000)  GTT: 155->113, 219, 224, 156 Genetic screening declined Anatomy US wnl  Prenatal Transfer Tool  Maternal Diabetes: Yes:  Diabetes Type:  Insulin/Medication controlled Genetic Screening: Declined Maternal Ultrasounds/Referrals: Normal Fetal Ultrasounds or other Referrals:  None Maternal Substance Abuse:  No Significant Maternal Medications:  glyburide Significant Maternal Lab Results: GBS in urine  Results for orders placed  or performed during the hospital encounter of 11/05/15 (from the past 24 hour(s))  CBC   Collection Time: 11/05/15  8:35 AM  Result Value Ref Range   WBC 7.5 4.0 - 10.5 K/uL   RBC 4.58 3.87 - 5.11 MIL/uL   Hemoglobin 11.7 (L) 12.0 - 15.0 g/dL   HCT 16.1 (L) 09.6 - 04.5 %   MCV 75.5 (L) 78.0 - 100.0 fL   MCH 25.5 (L) 26.0 - 34.0 pg   MCHC 33.8 30.0 - 36.0 g/dL   RDW 40.9 (H) 81.1 - 91.4 %   Platelets 201 150 - 400 K/uL  Type and screen   Collection Time: 11/05/15  8:35 AM   Result Value Ref Range   ABO/RH(D) B POS    Antibody Screen PENDING    Sample Expiration 11/08/2015   Glucose, capillary   Collection Time: 11/05/15  8:46 AM  Result Value Ref Range   Glucose-Capillary 145 (H) 65 - 99 mg/dL    Patient Active Problem List   Diagnosis Date Noted  . Gestational diabetes mellitus, class A2 11/05/2015  . History of shoulder dystocia in prior pregnancy, currently pregnant in third trimester 10/28/2015  . A2GDM (Glyburide) 07/15/2015  . Supervision of high risk pregnancy in third trimester 07/15/2015  . 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 N8G9562 at [redacted]w[redacted]d here for IOL for A2GDM on glyburide and h/o 30 sec shoulder dystocia with 9#2oz infant.   #Induction Labor: -admit to L&D with routine orders  -will begin pitocin after PCN running for 2 hrs  #A2GDM:  -CBG q4h in latent, q2h in active labor -if >140 start insulin gtt  # Fetal Wellbeing: fetus well appearing, Category 1 -Continue monitoring   # Pain Control:  -analgesia prn  # GBS: positive in urine -PCN  # Postpartum plan:  -Feeding: breast -Contraception: IUD  Wynne Dust, MD, PGY-1  OB fellow attestation:  I have seen and examined this patient; I agree with above documentation in the resident's note.   Erika Potts is a 35 y.o. Z3Y8657 here for IOL 2/2 to A2GDM  PE: BP 105/60 mmHg  Pulse 78  Temp(Src) 98.3 F (36.8 C) (Oral)  Resp 16  Ht  (1.626 m)  Wt 189 lb (85.73 kg)  BMI 32.43 kg/m2  LMP 02/05/2015 (Exact Date) Gen: calm comfortable, NAD Resp: normal effort, no distress Abd: gravid  ROS, labs, PMH reviewed  Plan: Labor: IOL for A2GDM.  Favorable cervix with bishop>8. Start Pitocin in 2 hours.  FWB: cat 1 x2. EFW 3822, 90th% ID: GBS pos urine, PCN started. Plan for ROM in 4+ hours A2GDM: CBG in labor and then fasting on PPD#1. Will need 6 wk  glucola. H/o shoulder dystocia: 3822g EFW 90th% from 1/23,proven to 9#2oz  Federico Flake, MD  Family Medicine, OB Fellow 11/05/2015, 11:15 AM

## 2015-11-05 NOTE — Progress Notes (Signed)
RN at bedside adjusting the [place]. PT refusing to move postions

## 2015-11-06 ENCOUNTER — Encounter (HOSPITAL_COMMUNITY): Payer: Self-pay

## 2015-11-06 LAB — GLUCOSE, CAPILLARY
GLUCOSE-CAPILLARY: 74 mg/dL (ref 65–99)
GLUCOSE-CAPILLARY: 85 mg/dL (ref 65–99)
Glucose-Capillary: 90 mg/dL (ref 65–99)

## 2015-11-06 NOTE — Progress Notes (Signed)
Post Partum Day 1  Subjective:  Erika Potts is a 35 y.o. Z6X0960 [redacted]w[redacted]d s/p SVD.  No acute events overnight.  Pt denies problems with ambulating, voiding or po intake.  She denies nausea or vomiting.  Pain is well controlled. Lochia Small.   Plan for birth control is IUD.   Method of Feeding: breast   Objective: BP 132/58 mmHg  Pulse 100  Temp(Src) 98.8 F (37.1 C) (Oral)  Resp 18  Ht  (1.626 m)  Wt 85.73 kg (189 lb)  BMI 32.43 kg/m2  LMP 02/05/2015 (Exact Date)  Breastfeeding? Unknown  Physical Exam:  General: alert, cooperative and no distress Lochia: normal flow Chest: CTAB Heart: RRR no m/r/g Abdomen: +BS, soft, nontender, fundus firm below umbilicus DVT Evaluation: No evidence of DVT seen on physical exam. Extremities: no edema   Recent Labs  11/05/15 0835  HGB 11.7*  HCT 34.6*    Assessment/Plan:  ASSESSMENT: Erika Potts is a 35 y.o. A5W0981 [redacted]w[redacted]d PPD #1 s/p NSVD doing well.   Plan for discharge tomorrow   LOS: 1 day   Niesha Bame 11/06/2015, 7:00 AM

## 2015-11-06 NOTE — Lactation Note (Signed)
This note was copied from the chart of Erika Potts. Lactation Consultation Note  Patient Name: Erika Braya Habermehl ZOXWR'U Date: 11/06/2015 Reason for consult: Follow-up assessment Baby at 22 hr of life and mom is worried that she is not making enough milk. She stated that she can not feel it. Showed mom how to manual expression differently and colostrum was easily expressed. Discussed baby behavior, feeding frequency, baby belly size, voids, wt loss, breast changes, and nipple care. Mom is aware of OP services and support group.    Maternal Data    Feeding Feeding Type: Breast Fed  LATCH Score/Interventions Latch: Grasps breast easily, tongue down, lips flanged, rhythmical sucking. Intervention(s): Adjust position  Audible Swallowing: Spontaneous and intermittent Intervention(s): Skin to skin;Hand expression  Type of Nipple: Everted at rest and after stimulation  Comfort (Breast/Nipple): Soft / non-tender     Hold (Positioning): No assistance needed to correctly position infant at breast. Intervention(s): Position options;Support Pillows  LATCH Score: 10  Lactation Tools Discussed/Used     Consult Status Consult Status: Follow-up Date: 11/07/15 Follow-up type: In-patient    Rulon Eisenmenger 11/06/2015, 6:51 PM

## 2015-11-07 ENCOUNTER — Ambulatory Visit: Payer: Self-pay

## 2015-11-07 MED ORDER — IBUPROFEN 600 MG PO TABS: 600.0000 mg | ORAL_TABLET | Freq: Four times a day (QID) | ORAL | Status: DC

## 2015-11-07 NOTE — Discharge Instructions (Signed)

## 2015-11-07 NOTE — Lactation Note (Signed)
This note was copied from the chart of Erika Kinaya Hamadou-Guebe. Lactation Consultation Note  Patient Name: Erika Potts Today's Date: 11/07/2015   Checked in on Mom on day of discharge, baby 23 hrs old.  Mom breast feeding baby in cradle hold, multiple swallows heard and seen.  Demonstrated manual breast expression on opposite side, and transitional milk sprayed easily. Instructed to use her milk on nipple for any soreness.  Reminded her of importance of wide open and deep latch to breast.  Manual pump given with instructions on use and cleaning.  Encouraged to feed baby at the breast using support, skin to skin, and feed her often on cue.  Engorgement prevention and treatment reviewed.  Reminded Mom of OP Lactation services available to her.  To call us prn.   Judee Clara 11/07/2015, 11:07 AM

## 2015-11-07 NOTE — Discharge Summary (Signed)
OB Discharge Summary     Patient Name: Erika Potts DOB: 1981-06-28 MRN: [id number]  Date of admission: 11/05/2015 Delivering MD: Shonna Chock BEDFORD   Date of discharge: 11/07/2015  Admitting diagnosis: INDUCTION Intrauterine pregnancy: [redacted]w[redacted]d     Secondary diagnosis:  Active Problems:   Gestational diabetes mellitus, class A2 Third trimester pregnancy   Additional problems: GBS in urine  H/o shoulder dystocia      Discharge diagnosis: Term Pregnancy Delivered, A2DM                                                                                              Post partum procedures:none  Augmentation: AROM and Pitocin  Complications: None  Hospital course:  Induction of Labor With Vaginal Delivery   35 y.o. yo G5P5005 at [redacted]w[redacted]d was admitted to the hospital 11/05/2015 for induction of labor.  Indication for induction: A2 DM and h/o shoulder dystocia.  Patient had an uncomplicated labor course as follows: Membrane Rupture Time/Date: 6:20 PM ,11/05/2015   Intrapartum Procedures: Episiotomy: None [1]                                         Lacerations:  None [1]  Patient had delivery of a Viable infant.  Information for the patient's newborn:  Velva Harman Girl Kristia [[id number]]  Delivery Method: Vag-Spont   11/05/2015  Details of delivery can be found in separate delivery note.  Patient had a routine postpartum course. Patient is discharged home 11/07/2015.   Physical exam  Filed Vitals:   11/05/15 2330 11/06/15 0359 11/06/15 1750 11/07/15 0554  BP: 108/58 132/58 118/66 107/67  Pulse:  100 96 72  Temp: 98 F (36.7 C) 98.8 F (37.1 C) 98 F (36.7 C) 97.7 F (36.5 C)  TempSrc:  Oral Oral Oral  Resp: Height:      Weight:       General: alert, cooperative and no distress Lochia: appropriate Uterine Fundus: firm Incision: N/A DVT Evaluation: No evidence of DVT seen on physical exam. Labs: Lab Results  Component Value Date   WBC 7.5  11/05/2015   HGB 11.7* 11/05/2015   HCT 34.6* 11/05/2015   MCV 75.5* 11/05/2015   PLT 201 11/05/2015   No flowsheet data found.  Discharge instruction: per After Visit Summary and "Baby and Me Booklet".  After visit meds:    Medication List    STOP taking these medications        glyBURIDE 2.5 MG tablet  Commonly known as:  DIABETA      TAKE these medications        ibuprofen 600 MG tablet  Commonly known as:  ADVIL,MOTRIN  Take 1 tablet (600 mg total) by mouth every 6 (six) hours.     multivitamin-prenatal 27-0.8 MG Tabs tablet  Take 1 tablet by mouth daily at 12 noon.        Diet: routine diet  Activity: Advance as tolerated. Pelvic rest for 6 weeks.   Outpatient follow up:6  weeks Follow up Appt:Future Appointments Date Time Provider Department Center  12/09/2015 2:15 PM Adam Phenix, MD [facility]   Follow up Visit:No Follow-up on file. Follow-up Information    Follow up with [facility].   Specialty:  Obstetrics and Gynecology   Why:  post partum check   Contact information:   [address] [place] [address] [phone]      Postpartum contraception: IUD Mirena  Newborn Data: Live born female  Birth Weight: 7 lb 8.8 oz (3425 g) APGAR: 8, 9  Baby Feeding: Breast Disposition:home with mother   11/07/2015 Wynne Dust, MD   CNM attestation I have seen and examined this patient and agree with above documentation in the resident's note.   Kourtnei Rauber is a 35 y.o. B1Y7829 s/p SVD.   Pain is well controlled.  Plan for birth control is IUD.  Method of Feeding: breast  PE:  BP 107/67 mmHg  Pulse 72  Temp(Src) 97.7 F (36.5 C) (Oral)  Resp 18  Ht  (1.626 m)  Wt 85.73 kg (189 lb)  BMI 32.43 kg/m2  LMP 02/05/2015 (Exact Date)  Breastfeeding? Unknown Fundus firm   Recent Labs  11/05/15 0835  HGB 11.7*  HCT 34.6*     Plan: discharge today - postpartum care discussed - f/u clinic in  6 weeks for postpartum visit   Cam Hai, CNM 9:23 AM  11/07/2015

## 2015-12-04 ENCOUNTER — Encounter: Payer: Self-pay | Admitting: *Deleted

## 2015-12-09 ENCOUNTER — Ambulatory Visit: Payer: Self-pay | Admitting: Obstetrics & Gynecology

## 2018-10-12 NOTE — L&D Delivery Note (Signed)
OB/GYN Faculty Practice Delivery Note  Erika Potts is a 38 y.o. M7E6754 s/p vaginal delivery at [redacted]w[redacted]d. She was admitted for IOL 2/2 A2GDM.   ROM: 2h 69m with clear fluid GBS Status: positive Maximum Maternal Temperature: 98.34F  Labor Progress: Patient was admitted and given one dose of cytotec. She was treated for GBS bacteriuria and started on Pitocin. AROM was performed and she progressed to complete within 2 hours.  Delivery Date/Time: 09/16/2019, 2009 hours Delivery: Called to room and patient was complete and pushing. Head delivered ROA, and the infant restituted ROT. No nuchal cord present. Shoulder and body delivered in usual fashion. Infant with spontaneous cry, placed on mother's abdomen, dried and stimulated. Cord clamped x 2 after 1-minute delay, and cut by me. Cord blood drawn. Placenta delivered spontaneously with gentle cord traction. Fundus firm with massage and Pitocin. Labia, perineum, vagina, and cervix were inspected, no lacerations or abrasions were noted.   Placenta: 3 vessel cord, intact, sent to L&D Complications: None Lacerations: None EBL: 53 mL Analgesia: None  Postpartum Planning [x]  message to sent to schedule follow-up  [x]  vaccines UTD  Infant: female  APGARs 9 & 9  weight 3805 g  Merilyn Baba, DO OB/GYN Fellow, Faculty Practice

## 2019-05-04 LAB — OB RESULTS CONSOLE PLATELET COUNT
Platelets: 235
Platelets: 235

## 2019-05-04 LAB — OB RESULTS CONSOLE HGB/HCT, BLOOD
HCT: 34 (ref 29–41)
HCT: 34 (ref 29–41)
Hemoglobin: 11
Hemoglobin: 11

## 2019-05-04 LAB — OB RESULTS CONSOLE RUBELLA ANTIBODY, IGM
Rubella: IMMUNE
Rubella: IMMUNE

## 2019-05-04 LAB — OB RESULTS CONSOLE GC/CHLAMYDIA
Chlamydia: NEGATIVE
Chlamydia: NEGATIVE
Gonorrhea: NEGATIVE
Gonorrhea: NEGATIVE

## 2019-05-04 LAB — OB RESULTS CONSOLE RPR
RPR: NONREACTIVE
RPR: NONREACTIVE

## 2019-05-04 LAB — CYTOLOGY - PAP: Pap: NEGATIVE

## 2019-05-04 LAB — OB RESULTS CONSOLE HIV ANTIBODY (ROUTINE TESTING)
HIV: NONREACTIVE
HIV: NONREACTIVE

## 2019-05-04 LAB — OB RESULTS CONSOLE HEPATITIS B SURFACE ANTIGEN
Hepatitis B Surface Ag: NEGATIVE
Hepatitis B Surface Ag: NEGATIVE

## 2019-05-04 LAB — CULTURE, OB URINE: Urine Culture, OB: POSITIVE

## 2019-05-10 ENCOUNTER — Encounter (HOSPITAL_COMMUNITY): Payer: Self-pay | Admitting: *Deleted

## 2019-05-12 ENCOUNTER — Ambulatory Visit (HOSPITAL_COMMUNITY): Payer: Self-pay | Attending: Obstetrics and Gynecology

## 2019-05-12 ENCOUNTER — Encounter (HOSPITAL_COMMUNITY): Payer: Self-pay

## 2019-07-03 LAB — GLUCOSE, 1 HOUR: Glucose 1 Hour: 190

## 2019-07-03 LAB — OB RESULTS CONSOLE RPR: RPR: NONREACTIVE

## 2019-07-03 LAB — OB RESULTS CONSOLE HGB/HCT, BLOOD
HCT: 33 (ref 29–41)
Hemoglobin: 10

## 2019-07-04 LAB — OB RESULTS CONSOLE GBS: GBS: NEGATIVE

## 2019-07-10 ENCOUNTER — Telehealth: Payer: Self-pay | Admitting: Family Medicine

## 2019-07-10 NOTE — Telephone Encounter (Signed)
Attempted to contact patient w/ [place] interpreter ID# [phone] about her appointment on 9/29 @ 11:15. No answer and unable to leave a message due to the voicemail not being set up.

## 2019-07-11 ENCOUNTER — Ambulatory Visit: Payer: Self-pay | Admitting: *Deleted

## 2019-07-11 ENCOUNTER — Encounter: Payer: 59 | Attending: Obstetrics & Gynecology | Admitting: *Deleted

## 2019-07-11 ENCOUNTER — Other Ambulatory Visit: Payer: Self-pay

## 2019-07-11 DIAGNOSIS — Z3A30 30 weeks gestation of pregnancy: Secondary | ICD-10-CM | POA: Diagnosis not present

## 2019-07-11 DIAGNOSIS — O24410 Gestational diabetes mellitus in pregnancy, diet controlled: Secondary | ICD-10-CM | POA: Diagnosis present

## 2019-07-11 DIAGNOSIS — O24419 Gestational diabetes mellitus in pregnancy, unspecified control: Secondary | ICD-10-CM

## 2019-07-11 MED ORDER — ACCU-CHEK GUIDE VI STRP: ORAL_STRIP | 12 refills | Status: DC

## 2019-07-11 MED ORDER — ACCU-CHEK FASTCLIX LANCETS MISC: 1.0000 | Freq: Four times a day (QID) | 12 refills | Status: DC

## 2019-07-11 MED ORDER — ACCU-CHEK GUIDE W/DEVICE KIT: 1.0000 | PACK | Freq: Every day | 0 refills | Status: DC

## 2019-07-12 NOTE — Progress Notes (Signed)
  Patient was seen on 07/11/2019 for Gestational Diabetes self-management. EDD 09/23/2019. Patient speaks [place], we have live interpretor here today for this visit. Patient states strong history of GDM with last 3 pregnancies. She states she took insulin with her first baby but not for the next two.  Diet history obtained. Patient eats goo variety of all food groups. Beverages include only water.  The following learning objectives were met by the patient :   States the definition of Gestational Diabetes  States why dietary management is important in controlling blood glucose  Describes the effects of carbohydrates on blood glucose levels  Demonstrates ability to create a balanced meal plan  Demonstrates carbohydrate counting   States when to check blood glucose levels  Demonstrates proper blood glucose monitoring techniques  States the effect of stress and exercise on blood glucose levels  States the importance of limiting caffeine and abstaining from alcohol and smoking  Plan:  Aim for 3 Carb Choices per meal (45 grams) +/- 1 either way  Aim for 1-2 Carbs per snack Begin reading food labels for Total Carbohydrate of foods If OK with your MD, consider  increasing your activity level by walking, Arm Chair Exercises or other activity daily as tolerated Begin checking BG before breakfast and 2 hours after first bite of breakfast, lunch and dinner as directed by MD  Bring Log Book/Sheet and meter to every medical appointment OR use Baby Scripts (see below) Baby Scripts:  Patient not appropriate for [employer] due to language barrier  Take medication if directed by MD  She states she has Medicaid number Blood glucose monitor Rx called into pharmacy: Accu Check Guide with Fast Clix drums Patient instructed to test pre breakfast and 2 hours each meal as directed by MD  Patient instructed to monitor glucose levels: FBS: 60 - 95 mg/dl 2 hour: <120 mg/dl  Patient received the  following handouts:  Nutrition Diabetes and Pregnancy  Carbohydrate Counting List  BG Log Sheet  Patient will be seen for follow-up as needed.

## 2019-07-18 ENCOUNTER — Encounter: Payer: Self-pay | Admitting: Emergency Medicine

## 2019-07-18 ENCOUNTER — Ambulatory Visit (INDEPENDENT_AMBULATORY_CARE_PROVIDER_SITE_OTHER): Payer: 59 | Admitting: Family Medicine

## 2019-07-18 ENCOUNTER — Other Ambulatory Visit: Payer: Self-pay

## 2019-07-18 ENCOUNTER — Encounter: Payer: Self-pay | Admitting: Family Medicine

## 2019-07-18 VITALS — BP 116/60 | HR 101 | Temp 98.1°F | Wt 196.1 lb

## 2019-07-18 DIAGNOSIS — Z3A30 30 weeks gestation of pregnancy: Secondary | ICD-10-CM

## 2019-07-18 DIAGNOSIS — O09293 Supervision of pregnancy with other poor reproductive or obstetric history, third trimester: Secondary | ICD-10-CM

## 2019-07-18 DIAGNOSIS — Z23 Encounter for immunization: Secondary | ICD-10-CM | POA: Diagnosis not present

## 2019-07-18 DIAGNOSIS — O0993 Supervision of high risk pregnancy, unspecified, third trimester: Secondary | ICD-10-CM

## 2019-07-18 DIAGNOSIS — O99820 Streptococcus B carrier state complicating pregnancy: Secondary | ICD-10-CM

## 2019-07-18 DIAGNOSIS — O0990 Supervision of high risk pregnancy, unspecified, unspecified trimester: Secondary | ICD-10-CM | POA: Insufficient documentation

## 2019-07-18 DIAGNOSIS — O24410 Gestational diabetes mellitus in pregnancy, diet controlled: Secondary | ICD-10-CM

## 2019-07-18 NOTE — Addendum Note (Signed)
Addended by: Fidela Juneau A on: 07/18/2019 11:06 AM   Modules accepted: Orders

## 2019-07-18 NOTE — Patient Instructions (Signed)
Gestational Diabetes Mellitus, Diagnosis Gestational diabetes (gestational diabetes mellitus) is a short-term (temporary) form of diabetes that can happen during pregnancy. It goes away after you give birth. It may be caused by one or both of these problems:  Your pancreas does not make enough of a hormone called insulin.  Your body does not respond in a normal way to insulin that it makes. Insulin lets sugars (glucose) go into cells in the body. This gives you energy. If you have diabetes, sugars cannot get into cells. This causes high blood sugar (hyperglycemia). If you get gestational diabetes, you are:  More likely to get it if you get pregnant again.  More likely to develop type 2 diabetes in the future. If gestational diabetes is treated, it may not hurt you or your baby. Your doctor will set treatment goals for you. In general, you should have these blood sugar levels:  After not eating for a long time (fasting): 95 mg/dL (5.3 mmol/L).  After meals (postprandial): ? One hour after a meal: at or below 140 mg/dL (7.8 mmol/L). ? Two hours after a meal: at or below 120 mg/dL (6.7 mmol/L).  A1c (hemoglobin A1c) level: 6-6.5%. Follow these instructions at home: Questions to ask your doctor   You may want to ask these questions: ? Do I need to meet with a diabetes educator? ? What equipment will I need to care for myself at home? ? What medicines do I need? When should I take them? ? How often do I need to check my blood sugar? ? What number can I call if I have questions? ? When is my next doctor's visit? General instructions  Take over-the-counter and prescription medicines only as told by your doctor.  Stay at a healthy weight during pregnancy.  Keep all follow-up visits as told by your doctor. This is important. Contact a doctor if:  Your blood sugar is at or above 240 mg/dL (13.3 mmol/L).  Your blood sugar is at or above 200 mg/dL (11.1 mmol/L) and you have ketones in  your pee (urine).  You have been sick or have had a fever for 2 days or more and you are not getting better.  You have any of these problems for more than 6 hours: ? You cannot eat or drink. ? You feel sick to your stomach (nauseous). ? You throw up (vomit). ? You have watery poop (diarrhea). Get help right away if:  Your blood sugar is lower than 54 mg/dL (3 mmol/L).  You get confused.  You have trouble: ? Thinking clearly. ? Breathing.  Your baby moves less than normal.  You have any of these: ? Moderate or large ketone levels in your pee. ? Blood coming from your vagina. ? Unusual fluid coming from your vagina. ? Early contractions. These may feel like tightness in your belly. Summary  Gestational diabetes is a short-term form of diabetes. It can happen while you are pregnant. It goes away after you give birth.  If gestational diabetes is treated, it may not hurt you or your baby. Your doctor will set treatment goals for you.  Keep all follow-up visits as told by your doctor. This is important. This information is not intended to replace advice given to you by your health care provider. Make sure you discuss any questions you have with your health care provider. Document Released: 01/20/2016 Document Revised: 11/04/2017 Document Reviewed: 11/01/2015 Elsevier Patient Education  2020 Elsevier Inc.  

## 2019-07-18 NOTE — Progress Notes (Signed)
Subjective:  Erika Potts is a 38 y.o. G6P5005 at 44w3dbeing seen today for ongoing prenatal care.  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  Patient reports no complaints.  Contractions: Not present. Vag. Bleeding: None.  Movement: Present. Denies leaking of fluid.   The following portions of the patient's history were reviewed and updated as appropriate: allergies, current medications, past family history, past medical history, past social history, past surgical history and problem list. Problem list updated.  Objective:   Vitals:   07/18/19 1021  BP: 116/60  Pulse: (!) 101  Temp: 98.1 F (36.7 C)  Weight: 196 lb 1.6 oz (89 kg)    Fetal Status: Fetal Heart Rate (bpm): 141   Movement: Present     General:  Alert, oriented and cooperative. Patient is in no acute distress.  Skin: Skin is warm and dry. No rash noted.   Cardiovascular: Normal heart rate noted  Respiratory: Normal respiratory effort, no problems with respiration noted  Abdomen: Soft, gravid, appropriate for gestational age. Pain/Pressure: Absent     Pelvic: Vag. Bleeding: None     Cervical exam deferred        Extremities: Normal range of motion.  Edema: None  Mental Status: Normal mood and affect. Normal behavior. Normal judgment and thought content.    Assessment and Plan:  Pregnancy: [id number] 339w3d1. Supervision of high risk pregnancy in third trimester -Flu shot  2. History of shoulder dystocia in prior pregnancy, currently pregnant in third trimester -In the pregnancy before the last one, baby's weight was  -Baby was 4159 g, says she has a history of big babies -Growth sono ordered  3. Diet  controlled gestational diabetes mellitus (GDM) in second trimester -Fasting sugars in low 100s, highest postprandial 140 -met with Diabetic education on 07/11/19 -working on diet, sugars have been improving -induction most likely at 37 weeks  4. 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 Please refer to After Visit Summary for other counseling recommendations.  Return in about 2 weeks (around 08[facility]01/2019) [facility]   Saren Corkern L, DO

## 2019-07-18 NOTE — Addendum Note (Signed)
Addended by: Fidela Juneau A on: 07/18/2019 11:01 AM   Modules accepted: Orders

## 2019-07-19 NOTE — Addendum Note (Signed)
Addended by: Michel Harrow on: 07/19/2019 05:16 PM   Modules accepted: Orders

## 2019-07-20 ENCOUNTER — Other Ambulatory Visit: Payer: Self-pay

## 2019-07-20 ENCOUNTER — Ambulatory Visit (HOSPITAL_COMMUNITY)
Admission: RE | Admit: 2019-07-20 | Discharge: 2019-07-20 | Disposition: A | Discharge status: Home / Self Care | Payer: 59 | Source: Ambulatory Visit | Attending: Family Medicine | Admitting: Family Medicine

## 2019-07-20 DIAGNOSIS — O09293 Supervision of pregnancy with other poor reproductive or obstetric history, third trimester: Secondary | ICD-10-CM | POA: Diagnosis not present

## 2019-07-20 DIAGNOSIS — O09523 Supervision of elderly multigravida, third trimester: Secondary | ICD-10-CM

## 2019-07-20 DIAGNOSIS — O0993 Supervision of high risk pregnancy, unspecified, third trimester: Secondary | ICD-10-CM | POA: Diagnosis present

## 2019-07-20 DIAGNOSIS — Z3A30 30 weeks gestation of pregnancy: Secondary | ICD-10-CM

## 2019-07-20 DIAGNOSIS — O24415 Gestational diabetes mellitus in pregnancy, controlled by oral hypoglycemic drugs: Secondary | ICD-10-CM | POA: Diagnosis not present

## 2019-07-20 DIAGNOSIS — O24410 Gestational diabetes mellitus in pregnancy, diet controlled: Secondary | ICD-10-CM | POA: Diagnosis present

## 2019-07-27 ENCOUNTER — Encounter: Payer: Self-pay | Admitting: Emergency Medicine

## 2019-08-02 ENCOUNTER — Encounter: Payer: Self-pay | Admitting: Family Medicine

## 2019-08-02 ENCOUNTER — Other Ambulatory Visit: Payer: Self-pay

## 2019-08-02 ENCOUNTER — Ambulatory Visit (INDEPENDENT_AMBULATORY_CARE_PROVIDER_SITE_OTHER): Payer: Self-pay | Admitting: Family Medicine

## 2019-08-02 VITALS — BP 113/52 | HR 98 | Wt 194.1 lb

## 2019-08-02 DIAGNOSIS — Z3A32 32 weeks gestation of pregnancy: Secondary | ICD-10-CM

## 2019-08-02 DIAGNOSIS — O0990 Supervision of high risk pregnancy, unspecified, unspecified trimester: Secondary | ICD-10-CM

## 2019-08-02 DIAGNOSIS — O0992 Supervision of high risk pregnancy, unspecified, second trimester: Secondary | ICD-10-CM

## 2019-08-02 DIAGNOSIS — O24410 Gestational diabetes mellitus in pregnancy, diet controlled: Secondary | ICD-10-CM

## 2019-08-02 MED ORDER — METFORMIN HCL 500 MG PO TABS: 500.0000 mg | ORAL_TABLET | Freq: Two times a day (BID) | ORAL | 1 refills | Status: DC

## 2019-08-02 NOTE — Progress Notes (Signed)
   PRENATAL VISIT NOTE  Subjective:  Erika Potts is a 38 y.o. G6P5005 at [redacted]w[redacted]d being seen today for ongoing prenatal care.  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  Patient reports no complaints.  Contractions: Irritability. Vag. Bleeding: None.  Movement: Present. Denies leaking of fluid.   The following portions of the patient's history were reviewed and updated as appropriate: allergies, current medications, past family history, past medical history, past social history, past surgical history and problem list.   Objective:   Vitals:   08/02/19 1331  BP: (!) 113/52  Pulse: 98  Weight: 194 lb 1.6 oz (88 kg)    Fetal Status: Fetal Heart Rate (bpm): 130 Fundal Height: 39 cm Movement: Present     General:  Alert, oriented and cooperative. Patient is in no acute distress.  Skin: Skin is warm and dry. No rash noted.   Cardiovascular: Normal heart rate noted  Respiratory: Normal respiratory effort, no problems with respiration noted  Abdomen: Soft, gravid, appropriate for gestational age.  Pain/Pressure: Present     Pelvic: Cervical exam deferred        Extremities: Normal range of motion.  Edema: None  Mental Status: Normal mood and affect. Normal behavior. Normal judgment and thought content.   Assessment and Plan:  Pregnancy: [id number] at [redacted]w[redacted]d 1. Supervision of high risk pregnancy, antepartum Continue routine prenatal care.  2. Diet controlled gestational diabetes mellitus (GDM) in second trimester See BabyScripts, all fastings are out--will lbegin Metformin. Pt does not want any insulin if possible Begin testing. Per MFM u/s fetal weight is at 98%--has h/o shoulder  dystocia--risks reviewed. - metFORMIN (GLUCOPHAGE) 500 MG tablet; Take 1 tablet (500 mg total) by mouth 2 (two) times daily with a meal.  Dispense: 120 tablet; Refill: 1  Preterm labor symptoms and general obstetric precautions including but not limited to vaginal bleeding, contractions, leaking of fluid and fetal movement were reviewed in detail with the patient. Please refer to After Visit Summary for other counseling recommendations.   Return in 1 week (on 08/09/2019) for [facility], in person, needs MD, OB visit and BPP.  No future appointments.  Donnamae Jude, MD

## 2019-08-02 NOTE — Patient Instructions (Signed)

## 2019-08-03 ENCOUNTER — Other Ambulatory Visit: Payer: Self-pay

## 2019-08-09 ENCOUNTER — Encounter: Payer: Self-pay | Admitting: Obstetrics and Gynecology

## 2019-08-09 ENCOUNTER — Other Ambulatory Visit: Payer: Self-pay

## 2019-08-19 ENCOUNTER — Encounter: Payer: Self-pay | Admitting: Family Medicine

## 2019-08-23 ENCOUNTER — Ambulatory Visit (INDEPENDENT_AMBULATORY_CARE_PROVIDER_SITE_OTHER): Payer: Self-pay | Admitting: Obstetrics & Gynecology

## 2019-08-23 ENCOUNTER — Other Ambulatory Visit: Payer: Self-pay

## 2019-08-23 ENCOUNTER — Ambulatory Visit: Payer: Self-pay

## 2019-08-23 ENCOUNTER — Ambulatory Visit (INDEPENDENT_AMBULATORY_CARE_PROVIDER_SITE_OTHER): Payer: 59 | Admitting: General Practice

## 2019-08-23 VITALS — BP 114/63 | HR 99 | Wt 195.0 lb

## 2019-08-23 DIAGNOSIS — O0990 Supervision of high risk pregnancy, unspecified, unspecified trimester: Secondary | ICD-10-CM

## 2019-08-23 DIAGNOSIS — Z3A35 35 weeks gestation of pregnancy: Secondary | ICD-10-CM

## 2019-08-23 DIAGNOSIS — O24410 Gestational diabetes mellitus in pregnancy, diet controlled: Secondary | ICD-10-CM | POA: Diagnosis not present

## 2019-08-23 DIAGNOSIS — O0993 Supervision of high risk pregnancy, unspecified, third trimester: Secondary | ICD-10-CM

## 2019-08-23 MED ORDER — METFORMIN HCL 500 MG PO TABS
ORAL_TABLET | ORAL | 1 refills | Status: DC
Start: 2019-08-23 — End: 2019-10-30

## 2019-08-23 NOTE — Progress Notes (Signed)
Pt informed that the ultrasound is considered a limited OB ultrasound and is not intended to be a complete ultrasound exam.  Patient also informed that the ultrasound is not being completed with the intent of assessing for fetal or placental anomalies or any pelvic abnormalities.  Explained that the purpose of today's ultrasound is to assess for  BPP, presentation and AFI.  Patient acknowledges the purpose of the exam and the limitations of the study.     Carrie H RN BSN 08/23/19  

## 2019-08-23 NOTE — Progress Notes (Signed)
   PRENATAL VISIT NOTE  Subjective:  Teneshia Hedeen is a 38 y.o. G6P5005 at [redacted]w[redacted]d being seen today for ongoing prenatal care.  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  Patient reports no complaints.  Contractions: Irritability. Vag. Bleeding: None.  Movement: Present. Denies leaking of fluid.   The following portions of the patient's history were reviewed and updated as appropriate: allergies, current medications, past family history, past medical history, past social history, past surgical history and problem list.   Objective:   Vitals:   08/23/19 1601  BP: 114/63  Pulse: 99  Weight: 195 lb (88.5 kg)    Fetal Status: Fetal Heart Rate (bpm): NST   Movement: Present     General:  Alert, oriented and cooperative. Patient is in no acute distress.  Skin: Skin is warm and dry. No rash noted.   Cardiovascular: Normal heart rate noted  Respiratory: Normal respiratory effort, no problems with respiration noted  Abdomen: Soft, gravid, appropriate for gestational age.  Pain/Pressure: Present     Pelvic: Cervical exam deferred        Extremities: Normal range of motion.  Edema: None  Mental Status: Normal mood and affect. Normal behavior. Normal judgment and thought content.   Assessment and Plan:  Pregnancy: Y6V7858 at [redacted]w[redacted]d 1. Supervision of high risk pregnancy, antepartum F/u US in one week, BPP  2. Diet controlled gestational diabetes mellitus (GDM) in second trimester FBS 90-100 and PP up to 130 - [place] MFM FETAL BPP WO NON STRESS; Future - metFORMIN (GLUCOPHAGE) 500 MG tablet; One tablet by mouth and 2 tablets at bedtime  Dispense: 120 tablet; Refill: 1 FBS Preterm labor  symptoms and general obstetric precautions including but not limited to vaginal bleeding, contractions, leaking of fluid and fetal movement were reviewed in detail with the patient. Please refer to After Visit Summary for other counseling recommendations.   Return in about 1 week (around 08/30/2019).  Future Appointments  Date Time Provider [place]  08/23/2019  4:35 PM [facility] IMAGING [place] [facility]OC  08/30/2019 10:15 AM Constant, Vickii Chafe, MD [facility]OC  08/30/2019 11:15 AM [facility] NST [facility]OC  08/30/2019  3:30 PM [place] 1 [facility]-MFCUS MFC-US    Emeterio Reeve, MD

## 2019-08-23 NOTE — Patient Instructions (Signed)

## 2019-08-29 ENCOUNTER — Telehealth: Payer: Self-pay | Admitting: Obstetrics and Gynecology

## 2019-08-29 NOTE — Telephone Encounter (Signed)
Spoke to patient about her appointment on 11/18 @ 10:15. Patient instructed to wear a face mask for the entire appointment and no visitors are allowed with her during the visit. Patient screened for covid symptoms and denied having any °

## 2019-08-30 ENCOUNTER — Other Ambulatory Visit: Payer: Self-pay

## 2019-08-30 ENCOUNTER — Ambulatory Visit (INDEPENDENT_AMBULATORY_CARE_PROVIDER_SITE_OTHER): Payer: Self-pay | Admitting: Obstetrics and Gynecology

## 2019-08-30 ENCOUNTER — Ambulatory Visit (HOSPITAL_COMMUNITY)
Admission: RE | Admit: 2019-08-30 | Discharge: 2019-08-30 | Disposition: A | Discharge status: Home / Self Care | Payer: 59 | Source: Ambulatory Visit | Attending: Obstetrics & Gynecology | Admitting: Obstetrics & Gynecology

## 2019-08-30 ENCOUNTER — Encounter: Payer: Self-pay | Admitting: Obstetrics and Gynecology

## 2019-08-30 ENCOUNTER — Ambulatory Visit (INDEPENDENT_AMBULATORY_CARE_PROVIDER_SITE_OTHER): Payer: 59 | Admitting: *Deleted

## 2019-08-30 VITALS — BP 105/50 | HR 93 | Wt 194.0 lb

## 2019-08-30 DIAGNOSIS — O24410 Gestational diabetes mellitus in pregnancy, diet controlled: Secondary | ICD-10-CM | POA: Insufficient documentation

## 2019-08-30 DIAGNOSIS — Z362 Encounter for other antenatal screening follow-up: Secondary | ICD-10-CM | POA: Diagnosis not present

## 2019-08-30 DIAGNOSIS — O09523 Supervision of elderly multigravida, third trimester: Secondary | ICD-10-CM | POA: Diagnosis not present

## 2019-08-30 DIAGNOSIS — O24415 Gestational diabetes mellitus in pregnancy, controlled by oral hypoglycemic drugs: Secondary | ICD-10-CM

## 2019-08-30 DIAGNOSIS — Z3A36 36 weeks gestation of pregnancy: Secondary | ICD-10-CM | POA: Diagnosis not present

## 2019-08-30 DIAGNOSIS — O09293 Supervision of pregnancy with other poor reproductive or obstetric history, third trimester: Secondary | ICD-10-CM

## 2019-08-30 DIAGNOSIS — O0993 Supervision of high risk pregnancy, unspecified, third trimester: Secondary | ICD-10-CM

## 2019-08-30 DIAGNOSIS — Z8632 Personal history of gestational diabetes: Secondary | ICD-10-CM | POA: Insufficient documentation

## 2019-08-30 DIAGNOSIS — Z113 Encounter for screening for infections with a predominantly sexual mode of transmission: Secondary | ICD-10-CM

## 2019-08-30 DIAGNOSIS — O24419 Gestational diabetes mellitus in pregnancy, unspecified control: Secondary | ICD-10-CM | POA: Insufficient documentation

## 2019-08-30 DIAGNOSIS — O0990 Supervision of high risk pregnancy, unspecified, unspecified trimester: Secondary | ICD-10-CM

## 2019-08-30 HISTORY — DX: Personal history of gestational diabetes: Z86.32

## 2019-08-30 NOTE — Progress Notes (Signed)
   PRENATAL VISIT NOTE  Subjective:  Erika Potts is a 38 y.o. G6P5005 at [redacted]w[redacted]d being seen today for ongoing prenatal care.  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  Patient reports no complaints.  Contractions: Irritability. Vag. Bleeding: None.  Movement: Present. Denies leaking of fluid.   The following portions of the patient's history were reviewed and updated as appropriate: allergies, current medications, past family history, past medical history, past social history, past surgical history and problem list.   Objective:   Vitals:   08/30/19 1050  BP: (!) 105/50  Pulse: 93  Weight: 194 lb (88 kg)    Fetal Status: Fetal Heart Rate (bpm): 153 Fundal Height: 38 cm Movement: Present  Presentation: Vertex  General:  Alert, oriented and cooperative. Patient is in no acute distress.  Skin: Skin is warm and dry. No rash noted.   Cardiovascular: Normal heart rate noted  Respiratory: Normal respiratory effort, no problems with respiration noted  Abdomen: Soft, gravid, appropriate for gestational age.  Pain/Pressure: Present     Pelvic: Cervical exam performed Dilation: 1 Effacement (%): Thick Station: Ballotable  Extremities: Normal range of motion.  Edema: None  Mental Status: Normal mood and affect. Normal behavior. Normal judgment and thought content.   Assessment and Plan:  Pregnancy: V6H2094 at [redacted]w[redacted]d 1. Supervision of high risk pregnancy, antepartum Patient is doing well without complaints Cultures collected  2. Gestational diabetes mellitus (GDM) in third trimester controlled on oral hypoglycemic drug CBGs reviewed and some fasting  and pp elevated. Patient admits to some dietary indiscretions and eating in the middle of the night Continue metformin NST today  3. History of shoulder dystocia in prior pregnancy, currently pregnant in third trimester 10/8 EFW 2150gm 98%tile Follow up growth today  Preterm labor symptoms and general obstetric precautions including but not limited to vaginal bleeding, contractions, leaking of fluid and fetal movement were reviewed in detail with the patient. Please refer to After Visit Summary for other counseling recommendations.   Return in about 1 week (around 09/06/2019) for in person, ROB, NST, BPP.  Future Appointments  Date Time Provider [place]  08/30/2019 11:15 AM [facility] NST [facility]OC  08/30/2019  3:30 PM [place] 1 WH-MFCUS MFC-US    Mora Bellman, MD

## 2019-08-31 LAB — CERVICOVAGINAL ANCILLARY ONLY
Chlamydia: NEGATIVE
Comment: NEGATIVE
Comment: NORMAL
Neisseria Gonorrhea: NEGATIVE

## 2019-09-03 LAB — CULTURE, BETA STREP (GROUP B ONLY): Strep Gp B Culture: NEGATIVE

## 2019-09-05 ENCOUNTER — Encounter: Payer: Self-pay | Admitting: General Practice

## 2019-09-05 NOTE — Progress Notes (Signed)
Received alert via babyscripts due to elevated blood sugars. Per chart review, patient has appt tomorrow- blood sugars will be reviewed at that time.

## 2019-09-06 ENCOUNTER — Ambulatory Visit (INDEPENDENT_AMBULATORY_CARE_PROVIDER_SITE_OTHER): Payer: 59 | Admitting: *Deleted

## 2019-09-06 ENCOUNTER — Ambulatory Visit (INDEPENDENT_AMBULATORY_CARE_PROVIDER_SITE_OTHER): Payer: Self-pay | Admitting: Advanced Practice Midwife

## 2019-09-06 ENCOUNTER — Other Ambulatory Visit: Payer: Self-pay

## 2019-09-06 ENCOUNTER — Ambulatory Visit: Payer: Self-pay

## 2019-09-06 ENCOUNTER — Encounter: Payer: Self-pay | Admitting: Advanced Practice Midwife

## 2019-09-06 ENCOUNTER — Telehealth (HOSPITAL_COMMUNITY): Payer: Self-pay | Admitting: *Deleted

## 2019-09-06 VITALS — BP 102/65 | HR 88 | Wt 192.4 lb

## 2019-09-06 DIAGNOSIS — Z3A37 37 weeks gestation of pregnancy: Secondary | ICD-10-CM

## 2019-09-06 DIAGNOSIS — O24415 Gestational diabetes mellitus in pregnancy, controlled by oral hypoglycemic drugs: Secondary | ICD-10-CM

## 2019-09-06 DIAGNOSIS — O0990 Supervision of high risk pregnancy, unspecified, unspecified trimester: Secondary | ICD-10-CM

## 2019-09-06 DIAGNOSIS — O0993 Supervision of high risk pregnancy, unspecified, third trimester: Secondary | ICD-10-CM

## 2019-09-06 NOTE — Progress Notes (Signed)

## 2019-09-06 NOTE — Progress Notes (Addendum)
NST:  Baseline: 120 bpm, Variability: Good {> 6 bpm), Accelerations: Reactive and Decelerations: Absent  Patient seen and assessed by nursing staff.  Agree with documentation and plan.

## 2019-09-06 NOTE — Progress Notes (Signed)
   PRENATAL VISIT NOTE  Subjective:  Erika Potts is a 38 y.o. G6P5005 at [redacted]w[redacted]d being seen today for ongoing prenatal care.  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  Patient reports no complaints.  Contractions: Irregular. Vag. Bleeding: None.  Movement: Present. Denies leaking of fluid.   The following portions of the patient's history were reviewed and updated as appropriate: allergies, current medications, past family history, past medical history, past social history, past surgical history and problem list.   Objective:   Vitals:   09/06/19 0923  BP: 102/65  Pulse: 88  Weight: 192 lb 6.4 oz (87.3 kg)    Fetal Status: Fetal Heart Rate (bpm): NST   Movement: Present     General:  Alert, oriented and cooperative. Patient is in no acute distress.  Skin: Skin is warm and dry. No rash noted.   Cardiovascular: Normal heart rate noted  Respiratory: Normal respiratory effort, no problems with respiration noted  Abdomen: Soft, gravid, appropriate for gestational age.  Pain/Pressure: Present     Pelvic: Cervical exam deferred        Extremities: Normal range of motion.  Edema: None  Mental Status: Normal mood and affect. Normal behavior. Normal judgment and thought content.   Assessment and Plan:  Pregnancy: [id number] at [redacted]w[redacted]d 1. Supervision of high risk pregnancy, antepartum - NST reactive today  2. Gestational diabetes mellitus (GDM) in third trimester controlled on oral hypoglycemic drug - Reviewed CBG with Dr. Kennon Rounds add 500 mg at breakfast (in addition to the current 1000mg  at bedtime).  - 10 min of exercise TID - Advised patient to be  sure to eat protein with bedtime snack.  - 39 week IOL scheduled  -  IOL orders placed   Term labor symptoms and general obstetric precautions including but not limited to vaginal bleeding, contractions, leaking of fluid and fetal movement were reviewed in detail with the patient. Please refer to After Visit Summary for other counseling recommendations.   Return in about 1 week (around 09/13/2019) for NST/BPP and HOB.  Future Appointments  Date Time Provider [place]  09/06/2019  9:55 AM [place] [facility] IMAGING [place], CNM  09/06/19  10:10 AM

## 2019-09-06 NOTE — Telephone Encounter (Signed)
Preadmission screen  

## 2019-09-08 ENCOUNTER — Other Ambulatory Visit (HOSPITAL_COMMUNITY): Payer: Self-pay

## 2019-09-10 ENCOUNTER — Other Ambulatory Visit: Payer: Self-pay

## 2019-09-11 ENCOUNTER — Other Ambulatory Visit: Payer: Self-pay | Admitting: Advanced Practice Midwife

## 2019-09-14 ENCOUNTER — Other Ambulatory Visit: Payer: Self-pay

## 2019-09-14 ENCOUNTER — Other Ambulatory Visit (HOSPITAL_COMMUNITY)
Admission: RE | Admit: 2019-09-14 | Discharge: 2019-09-14 | Disposition: A | Discharge status: Home / Self Care | Payer: 59 | Source: Ambulatory Visit | Attending: Obstetrics & Gynecology | Admitting: Obstetrics & Gynecology

## 2019-09-14 ENCOUNTER — Ambulatory Visit (INDEPENDENT_AMBULATORY_CARE_PROVIDER_SITE_OTHER): Payer: Self-pay | Admitting: *Deleted

## 2019-09-14 ENCOUNTER — Ambulatory Visit (INDEPENDENT_AMBULATORY_CARE_PROVIDER_SITE_OTHER): Payer: 59 | Admitting: Obstetrics & Gynecology

## 2019-09-14 ENCOUNTER — Ambulatory Visit: Payer: Self-pay

## 2019-09-14 VITALS — BP 109/61 | HR 102 | Wt 196.0 lb

## 2019-09-14 DIAGNOSIS — O0993 Supervision of high risk pregnancy, unspecified, third trimester: Secondary | ICD-10-CM

## 2019-09-14 DIAGNOSIS — O24415 Gestational diabetes mellitus in pregnancy, controlled by oral hypoglycemic drugs: Secondary | ICD-10-CM

## 2019-09-14 DIAGNOSIS — Z3A38 38 weeks gestation of pregnancy: Secondary | ICD-10-CM

## 2019-09-14 DIAGNOSIS — Z20828 Contact with and (suspected) exposure to other viral communicable diseases: Secondary | ICD-10-CM | POA: Insufficient documentation

## 2019-09-14 DIAGNOSIS — Z01812 Encounter for preprocedural laboratory examination: Secondary | ICD-10-CM | POA: Insufficient documentation

## 2019-09-14 LAB — SARS CORONAVIRUS 2 (TAT 6-24 HRS): SARS Coronavirus 2: NEGATIVE

## 2019-09-14 NOTE — Progress Notes (Signed)
   PRENATAL VISIT NOTE  Subjective:  Erika Potts is a 38 y.o. G6P5005 at [redacted]w[redacted]d being seen today for ongoing prenatal care.  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  Patient reports no complaints.  Contractions: Irregular. Vag. Bleeding: None.  Movement: Present. Denies leaking of fluid.   The following portions of the patient's history were reviewed and updated as appropriate: allergies, current medications, past family history, past medical history, past social history, past surgical history and problem list.   Objective:   Vitals:   09/14/19 1412  BP: 109/61  Pulse: (!) 102  Weight: 196 lb (88.9 kg)    Fetal Status: Fetal Heart Rate (bpm): 141   Movement: Present     General:  Alert, oriented and cooperative. Patient is in no acute distress.  Skin: Skin is warm and dry. No rash noted.   Cardiovascular: Normal heart rate noted  Respiratory: Normal respiratory effort, no problems with respiration noted  Abdomen: Soft, gravid, appropriate for gestational age.  Pain/Pressure: Present     Pelvic: Cervical exam deferred        Extremities: Normal range of motion.  Edema: None  Mental Status: Normal mood and affect. Normal behavior. Normal judgment and thought content.   Assessment and Plan:  Pregnancy: S9F0263 at [redacted]w[redacted]d 1. Gestational diabetes mellitus (GDM) in third trimester controlled on oral hypoglycemic drug Stable CBGs on metformin. NST performed today was reviewed and was found to be reactive. Subsequent BPP performed today was also reviewed and was found to be 10/10. AFI was also normal. IOL already scheduled in two days.   2.  Supervision of high risk pregnancy in third trimester Term labor symptoms and general obstetric precautions including but not limited to vaginal bleeding, contractions, leaking of fluid and fetal movement were reviewed in detail with the patient. Please refer to After Visit Summary for other counseling recommendations.   Return in about 6 weeks (around 10/26/2019) for Postpartum check.  Future Appointments  Date Time Provider [place]  09/16/2019  7:30 AM [facility] SCHED ROOM [facility] None    Verita Schneiders, MD

## 2019-09-14 NOTE — Patient Instructions (Signed)
Return to office for any scheduled appointments. Call the office or go to the MAU at [facility] [place] if:  You begin to have strong, frequent contractions  Your water breaks.  Sometimes it is a big gush of fluid, sometimes it is just a trickle that keeps getting your panties wet or running down your legs  You have vaginal bleeding.  It is normal to have a small amount of spotting if your cervix was checked.   You do not feel your baby moving like normal.  If you do not, get something to eat and drink and lay down and focus on feeling your baby move.   If your baby is still not moving like normal, you should call the office or go to MAU.  Any other obstetric concerns.   

## 2019-09-16 ENCOUNTER — Inpatient Hospital Stay (HOSPITAL_COMMUNITY)
Admission: AD | Admit: 2019-09-16 | Discharge: 2019-09-18 | DRG: 807 | Disposition: A | Discharge status: Home / Self Care | Payer: 59 | Attending: Family Medicine | Admitting: Family Medicine

## 2019-09-16 ENCOUNTER — Encounter (HOSPITAL_COMMUNITY): Payer: Self-pay | Admitting: General Practice

## 2019-09-16 ENCOUNTER — Other Ambulatory Visit: Payer: Self-pay

## 2019-09-16 ENCOUNTER — Inpatient Hospital Stay (HOSPITAL_COMMUNITY): Payer: 59

## 2019-09-16 DIAGNOSIS — Z20828 Contact with and (suspected) exposure to other viral communicable diseases: Secondary | ICD-10-CM | POA: Diagnosis present

## 2019-09-16 DIAGNOSIS — O24425 Gestational diabetes mellitus in childbirth, controlled by oral hypoglycemic drugs: Principal | ICD-10-CM | POA: Diagnosis present

## 2019-09-16 DIAGNOSIS — Z3A39 39 weeks gestation of pregnancy: Secondary | ICD-10-CM

## 2019-09-16 DIAGNOSIS — O99824 Streptococcus B carrier state complicating childbirth: Secondary | ICD-10-CM | POA: Diagnosis present

## 2019-09-16 DIAGNOSIS — O24419 Gestational diabetes mellitus in pregnancy, unspecified control: Secondary | ICD-10-CM | POA: Diagnosis present

## 2019-09-16 DIAGNOSIS — O24415 Gestational diabetes mellitus in pregnancy, controlled by oral hypoglycemic drugs: Secondary | ICD-10-CM

## 2019-09-16 DIAGNOSIS — O0990 Supervision of high risk pregnancy, unspecified, unspecified trimester: Secondary | ICD-10-CM

## 2019-09-16 DIAGNOSIS — O24424 Gestational diabetes mellitus in childbirth, insulin controlled: Secondary | ICD-10-CM

## 2019-09-16 LAB — CBC
HCT: 33 % — ABNORMAL LOW (ref 36.0–46.0)
Hemoglobin: 10.4 g/dL — ABNORMAL LOW (ref 12.0–15.0)
MCH: 23.9 pg — ABNORMAL LOW (ref 26.0–34.0)
MCHC: 31.5 g/dL (ref 30.0–36.0)
MCV: 75.9 fL — ABNORMAL LOW (ref 80.0–100.0)
Platelets: 226 10*3/uL (ref 150–400)
RBC: 4.35 MIL/uL (ref 3.87–5.11)
RDW: 15.9 % — ABNORMAL HIGH (ref 11.5–15.5)
WBC: 6.8 10*3/uL (ref 4.0–10.5)
nRBC: 0 % (ref 0.0–0.2)

## 2019-09-16 LAB — TYPE AND SCREEN
ABO/RH(D): B POS
Antibody Screen: NEGATIVE

## 2019-09-16 LAB — GLUCOSE, CAPILLARY: Glucose-Capillary: 67 mg/dL — ABNORMAL LOW (ref 70–99)

## 2019-09-16 LAB — RPR: RPR Ser Ql: NONREACTIVE

## 2019-09-16 LAB — ABO/RH: ABO/RH(D): B POS

## 2019-09-16 MED ORDER — ACETAMINOPHEN 325 MG PO TABS: 650.0000 mg | ORAL_TABLET | ORAL | Status: DC | PRN

## 2019-09-16 MED ORDER — ONDANSETRON HCL 4 MG/2ML IJ SOLN: 4.0000 mg | Freq: Four times a day (QID) | INTRAMUSCULAR | Status: DC | PRN

## 2019-09-16 MED ORDER — FENTANYL-BUPIVACAINE-NACL 0.5-0.125-0.9 MG/250ML-% EP SOLN: 12.0000 mL/h | EPIDURAL | Status: DC | PRN

## 2019-09-16 MED ORDER — OXYTOCIN 40 UNITS IN NORMAL SALINE INFUSION - SIMPLE MED: 2.5000 [IU]/h | INTRAVENOUS | Status: DC

## 2019-09-16 MED ORDER — SOD CITRATE-CITRIC ACID 500-334 MG/5ML PO SOLN: 30.0000 mL | ORAL | Status: DC | PRN

## 2019-09-16 MED ORDER — LACTATED RINGERS IV SOLN
500.0000 mL | INTRAVENOUS | Status: DC | PRN
  Administered 2019-09-16: 500 mL via INTRAVENOUS

## 2019-09-16 MED ORDER — OXYCODONE-ACETAMINOPHEN 5-325 MG PO TABS: 2.0000 | ORAL_TABLET | ORAL | Status: DC | PRN

## 2019-09-16 MED ORDER — OXYTOCIN 40 UNITS IN NORMAL SALINE INFUSION - SIMPLE MED
1.0000 m[IU]/min | INTRAVENOUS | Status: DC
  Administered 2019-09-16: 2 m[IU]/min via INTRAVENOUS
  Filled 2019-09-16: qty 1000

## 2019-09-16 MED ORDER — FENTANYL CITRATE (PF) 100 MCG/2ML IJ SOLN: 100.0000 ug | INTRAMUSCULAR | Status: DC | PRN

## 2019-09-16 MED ORDER — EPHEDRINE 5 MG/ML INJ: 10.0000 mg | INTRAVENOUS | Status: DC | PRN

## 2019-09-16 MED ORDER — DIBUCAINE (PERIANAL) 1 % EX OINT: 1.0000 "application " | TOPICAL_OINTMENT | CUTANEOUS | Status: DC | PRN

## 2019-09-16 MED ORDER — TRANEXAMIC ACID-NACL 1000-0.7 MG/100ML-% IV SOLN
INTRAVENOUS | Status: AC
  Filled 2019-09-16: qty 100

## 2019-09-16 MED ORDER — BENZOCAINE-MENTHOL 20-0.5 % EX AERO: 1.0000 "application " | INHALATION_SPRAY | CUTANEOUS | Status: DC | PRN

## 2019-09-16 MED ORDER — OXYTOCIN BOLUS FROM INFUSION
500.0000 mL | Freq: Once | INTRAVENOUS | Status: AC
  Administered 2019-09-16: 500 mL via INTRAVENOUS

## 2019-09-16 MED ORDER — SIMETHICONE 80 MG PO CHEW: 80.0000 mg | CHEWABLE_TABLET | ORAL | Status: DC | PRN

## 2019-09-16 MED ORDER — OXYCODONE HCL 5 MG PO TABS: 10.0000 mg | ORAL_TABLET | ORAL | Status: DC | PRN

## 2019-09-16 MED ORDER — PHENYLEPHRINE 40 MCG/ML (10ML) SYRINGE FOR IV PUSH (FOR BLOOD PRESSURE SUPPORT): 80.0000 ug | PREFILLED_SYRINGE | INTRAVENOUS | Status: DC | PRN

## 2019-09-16 MED ORDER — LIDOCAINE HCL (PF) 1 % IJ SOLN: 30.0000 mL | INTRAMUSCULAR | Status: DC | PRN

## 2019-09-16 MED ORDER — TETANUS-DIPHTH-ACELL PERTUSSIS 5-2.5-18.5 LF-MCG/0.5 IM SUSP: 0.5000 mL | Freq: Once | INTRAMUSCULAR | Status: DC

## 2019-09-16 MED ORDER — ONDANSETRON HCL 4 MG/2ML IJ SOLN: 4.0000 mg | INTRAMUSCULAR | Status: DC | PRN

## 2019-09-16 MED ORDER — COCONUT OIL OIL: 1.0000 "application " | TOPICAL_OIL | Status: DC | PRN

## 2019-09-16 MED ORDER — ONDANSETRON HCL 4 MG PO TABS: 4.0000 mg | ORAL_TABLET | ORAL | Status: DC | PRN

## 2019-09-16 MED ORDER — MISOPROSTOL 50MCG HALF TABLET
50.0000 ug | ORAL_TABLET | ORAL | Status: DC | PRN
  Administered 2019-09-16: 50 ug via BUCCAL
  Filled 2019-09-16: qty 1

## 2019-09-16 MED ORDER — SENNOSIDES-DOCUSATE SODIUM 8.6-50 MG PO TABS
2.0000 | ORAL_TABLET | ORAL | Status: DC
  Administered 2019-09-17: 23:00:00 2 via ORAL
  Filled 2019-09-16: qty 2

## 2019-09-16 MED ORDER — TERBUTALINE SULFATE 1 MG/ML IJ SOLN: 0.2500 mg | Freq: Once | INTRAMUSCULAR | Status: DC | PRN

## 2019-09-16 MED ORDER — DIPHENHYDRAMINE HCL 50 MG/ML IJ SOLN: 12.5000 mg | INTRAMUSCULAR | Status: DC | PRN

## 2019-09-16 MED ORDER — PENICILLIN G POT IN DEXTROSE 60000 UNIT/ML IV SOLN
3.0000 10*6.[IU] | INTRAVENOUS | Status: DC
  Administered 2019-09-16: 3 10*6.[IU] via INTRAVENOUS
  Filled 2019-09-16: qty 50

## 2019-09-16 MED ORDER — OXYCODONE HCL 5 MG PO TABS: 5.0000 mg | ORAL_TABLET | ORAL | Status: DC | PRN

## 2019-09-16 MED ORDER — ZOLPIDEM TARTRATE 5 MG PO TABS: 5.0000 mg | ORAL_TABLET | Freq: Every evening | ORAL | Status: DC | PRN

## 2019-09-16 MED ORDER — IBUPROFEN 600 MG PO TABS
600.0000 mg | ORAL_TABLET | Freq: Four times a day (QID) | ORAL | Status: DC
  Administered 2019-09-17 – 2019-09-18 (×4): 600 mg via ORAL
  Filled 2019-09-16 (×4): qty 1

## 2019-09-16 MED ORDER — WITCH HAZEL-GLYCERIN EX PADS: 1.0000 "application " | MEDICATED_PAD | CUTANEOUS | Status: DC | PRN

## 2019-09-16 MED ORDER — LACTATED RINGERS IV SOLN
INTRAVENOUS | Status: DC
  Administered 2019-09-16 (×2): via INTRAVENOUS

## 2019-09-16 MED ORDER — SODIUM CHLORIDE 0.9 % IV SOLN
5.0000 10*6.[IU] | Freq: Once | INTRAVENOUS | Status: AC
  Administered 2019-09-16: 5 10*6.[IU] via INTRAVENOUS
  Filled 2019-09-16: qty 5

## 2019-09-16 MED ORDER — DIPHENHYDRAMINE HCL 25 MG PO CAPS: 25.0000 mg | ORAL_CAPSULE | Freq: Four times a day (QID) | ORAL | Status: DC | PRN

## 2019-09-16 MED ORDER — LACTATED RINGERS IV SOLN: 500.0000 mL | Freq: Once | INTRAVENOUS | Status: DC

## 2019-09-16 MED ORDER — OXYCODONE-ACETAMINOPHEN 5-325 MG PO TABS: 1.0000 | ORAL_TABLET | ORAL | Status: DC | PRN

## 2019-09-16 MED ORDER — ERYTHROMYCIN 5 MG/GM OP OINT
TOPICAL_OINTMENT | OPHTHALMIC | Status: AC
  Filled 2019-09-16: qty 1

## 2019-09-16 MED ORDER — PRENATAL MULTIVITAMIN CH
1.0000 | ORAL_TABLET | Freq: Every day | ORAL | Status: DC
  Administered 2019-09-17 – 2019-09-18 (×2): 1 via ORAL
  Filled 2019-09-16 (×2): qty 1

## 2019-09-16 NOTE — Progress Notes (Addendum)
Labor Progress Note Erika Potts is a 38 y.o. O9G2952 at [redacted]w[redacted]d presented for IOL for A2GDM on metformin S: Contractions are q2-3 min. Tolerating pain w/o medications, declines epidural.   O:  BP (!) 105/54   Pulse 84   Temp 97.8 F (36.6 C) (Oral)   Resp 18   Ht 5\' 4"  (1.626 m)   Wt 88.5 kg   LMP 12/17/2018 (Exact Date)   BMI 33.47 kg/m  EFM: 130bpm/moderate variability/+accles, no decels  CVE: 6.5/70/-2  A&P: 38 y.o. W4X3244 [redacted]w[redacted]d presented for IOL for A2GDM on metformin.  #Labor: Progressing well. S/p cytotec at 1026 and started pitocin at 1504. S/o AROM at 1805 w/ small amount of clear fluid. Anticipate progression to vaginal delivery. #Pain: IV fentanyl PRN, declines for now #FWB: Cat 1 FHT  #GBS positive, s/p PenG x2 doses. Cont q4h until delivery.  #A2gDM: Hold metformin. Check BG q2h  Demetrius Revel, MD 6:05 PM

## 2019-09-16 NOTE — Progress Notes (Signed)
Labor Progress Note Erika Potts is a 38 y.o. U1T1438 at [redacted]w[redacted]d presented for IOL for A2GDM on metformin. S: She is doing well. Endorses some mild cramping. Irregular contractions ~q64min  O:  BP (!) 104/44   Pulse 95   Temp 98.9 F (37.2 C) (Oral)   Resp 18   Ht 5\' 4"  (1.626 m)   Wt 88.5 kg   LMP 12/17/2018 (Exact Date)   BMI 33.47 kg/m  EFM: 135bpm/moderate variability/+accels, no decels  OIL:[id number]   A&P: 38 y.o. U0R5615 [redacted]w[redacted]d presented for IOL for A2GDM on metformin. Further evaluation of chart revealed a urine culture positive for GBS on 05/04/2019. IV PenG q4h was ordered and discussed w/ the patient who is amenable to starting prophylaxis. Will hold off on AROM until she has received adequate prophylaxis.   Demetrius Revel, MD 1:41 PM

## 2019-09-16 NOTE — Progress Notes (Signed)
Labor Progress Note Erika Potts is a 38 y.o. G9Q4210 at [redacted]w[redacted]d presented for IOL for A2GDM on metformin S: She is feeling well. Having contractions q2-7 mins, pain well tolerated w/o intervention. As nurse was preparing for cervical check, the shape of the uterus grossly changed suddenly, so bedside US was performed and reconfirmed cephalic presentation.   O:  BP (!) 132/59   Pulse 96   Temp 98.9 F (37.2 C) (Oral)   Resp 18   Ht 5\' 4"  (1.626 m)   Wt 88.5 kg   LMP 12/17/2018 (Exact Date)   BMI 33.47 kg/m  EFM: 135 bpm/moderate variability/+ accels, no decels  CVE: Dilation: 4 Effacement (%): 30 Cervical Position: Middle Station: Ballotable Presentation: Vertex(via [place]) Exam by:: Savoy Somerville   A&P: 38 y.o. [id number] [redacted]w[redacted]d presented for IOL for A2GDM on metformin  #Labor: Progressing well. S/p cytotec at 1026. Start pitocin and uptitrate as indicated. Hold off on AROM until head is more applied and she has received 2nd dose PenG. Anticipate progression to vaginal delivery. #Pain: IV fentanyl PRN #FWB: Cat 1 FHT  #GBS positive, s/p PenG x1 dose. Cont q4h.  #A2gDM: Hold metformin. Check BG q4h, q2h when in active labor.   Demetrius Revel, MD 2:58 PM

## 2019-09-16 NOTE — Plan of Care (Signed)
completed

## 2019-09-16 NOTE — Discharge Summary (Signed)
Postpartum Discharge Summary      Patient Name: Erika Potts DOB: 1981-05-06 MRN: [id number]  Date of admission: 09/16/2019 Delivering Provider: Merilyn Baba   Date of discharge: 09/18/2019  Admitting diagnosis: preg Intrauterine pregnancy: [redacted]w[redacted]d    Secondary diagnosis:  Active Problems:   Gestational diabetes  Additional problems: None     Discharge diagnosis: Term Pregnancy Delivered and GDM A2                                                                                                Post partum procedures:None  Augmentation: AROM, Pitocin and Cytotec  Complications: None  Hospital course:  Induction of Labor With Vaginal Delivery   38y.o. yo [id number] 38w0das admitted to the hospital 09/16/2019 for induction of labor.  Indication for induction: A2 DM.  Patient had an uncomplicated labor course as follows:  Patient was admitted and given one dose of cytotec. She was treated for GBS bacteriuria and started on Pitocin. AROM was performed and she progressed to complete within 2 hours.  Membrane Rupture Time/Date: 6:00 PM ,09/16/2019   Intrapartum Procedures: Episiotomy: None [1]                                         Lacerations:  None [1]  Patient had delivery of a Viable infant.  Information for the patient's newborn:  HaMcarthur Rossettiirl LaArley0[[id number]]Delivery Method: Vag-Spont    09/16/2019  Details of delivery can be found in separate delivery note.  Patient had a routine postpartum course. Patient is discharged home 09/18/19. Delivery time: 8:09 PM    Magnesium Sulfate received: No BMZ received: No Rhophylac:N/A MMR:N/A Transfusion:No  Physical exam  Vitals:   09/17/19 0330 09/17/19 0804 09/17/19 1252 09/17/19 2123  BP: (!) 99/53 101/66 112/65 97/62  Pulse: 73 70  66  Resp: 16 18 18 16   Temp: 98 F (36.7 C) 98.7 F (37.1 C) 97.9 F (36.6 C) 98.7 F (37.1 C)  TempSrc: Oral Oral Axillary Oral  SpO2: 100%     Weight:       Height:       General: alert, cooperative and no distress Lochia: appropriate Uterine Fundus: firm Incision: N/A DVT Evaluation: No evidence of DVT seen on physical exam. Negative Homan's sign. No cords or calf tenderness. No significant calf/ankle edema. Labs: Lab Results  Component Value Date   WBC 6.8 09/16/2019   HGB 10.4 (L) 09/16/2019   HCT 33.0 (L) 09/16/2019   MCV 75.9 (L) 09/16/2019   PLT 226 09/16/2019   No flowsheet data found.  Discharge instruction: per After Visit Summary and "Baby and Me Booklet".  After visit meds:  Allergies as of 09/18/2019   No Known Allergies     Medication List    TAKE these medications   Accu-Chek FastClix Lancets Misc 1 Device by Percutaneous route 4 (four) times daily.   Accu-Chek Guide test strip Generic drug: glucose blood Use as instructed QID  Accu-Chek Guide w/Device Kit 1 Device by Does not apply route daily.   acetaminophen 325 MG tablet Commonly known as: Tylenol Take 2 tablets (650 mg total) by mouth every 4 (four) hours as needed (for pain scale < 4).   ibuprofen 600 MG tablet Commonly known as: ADVIL Take 1 tablet (600 mg total) by mouth every 6 (six) hours.   metFORMIN 500 MG tablet Commonly known as: GLUCOPHAGE One tablet by mouth and 2 tablets at bedtime What changed: additional instructions   multivitamin-prenatal 27-0.8 MG Tabs tablet Take 1 tablet by mouth daily at 12 noon.       Diet: routine diet  Activity: Advance as tolerated. Pelvic rest for 6 weeks.   Outpatient follow up:4 weeks Follow up Appt:No future appointments. Follow up Visit: Please schedule this patient for Postpartum visit in: 4 weeks with the following provider: Any provider For C/S patients schedule nurse incision check in weeks 2 weeks: no High risk pregnancy complicated by: GDMA2 on metformin Delivery mode:  vaginal Anticipated Birth Control:  other/unsure PP Procedures needed: 2 hour GTT  Schedule Integrated BH  visit: no Newborn Data: Live born female  Birth Weight: 3805 g  APGAR: 23, 9  Newborn Delivery   Birth date/time: 09/16/2019 20:09:00 Delivery type: Vaginal, Spontaneous      Baby Feeding: Bottle and Breast Disposition:home with mother   09/18/2019 Merilyn Baba, DO

## 2019-09-16 NOTE — H&P (Signed)
OBSTETRIC ADMISSION HISTORY AND PHYSICAL  Erika Potts is a 38 y.o. female [id number] with IUP at 52w0dby LMP presenting for IOL for A2GDM on metformin. She reports +FMs, No LOF, no VB, no blurry vision, headaches or peripheral edema, and RUQ pain.  She plans on breast and bottle feeding. She is considering an IUD for birth control. [facility]  Dating: By LMP --->  Estimated Date of Delivery: 09/23/19   Nursing Staff Provider  [facility]   LMP + 19 wk uK[place] Language  English Anatomy [place] WNL  Flu Vaccine  07/18/19 Genetic Screen  NIPS:   AFP:   First Screen:  Quad:    TDaP vaccine   07/03/19 Hgb A1C or  GTT Early 100 Third trimester abnormal 1 hr  Rhogam  NA   LAB RESULTS   Feeding Plan Breast/formula Blood Type     Contraception IUD Antibody    Circumcision n/a Rubella Immune, Immune (07/23 0000)  PGreerfor Children RPR Nonreactive (09/21 0000)   Support Person Erika Potts (FOB) HBsAg Negative, Negative (07/23 0000)   Prenatal Classes  HIV Non-reactive, Non-reactive (07/23 0000)  BTL Consent  GBS Negative/-- (09/22 0000)(For PCN allergy, check sensitivities)   VBAC Consent  Pap     Hgb Electro    BP Cuff  CF     SMA     Waterbirth  _0  Class _1  Consent _2  CNM visit    Prenatal History/Complications:  Past Medical History: Past Medical History:  Diagnosis Date  . Abnormal Pap smear    ASCUS 2007  . Gestational diabetes     Past Surgical History: Past Surgical History:  Procedure Laterality Date  . NO PAST SURGERIES      Obstetrical History: OB History    Gravida  6   Para  5   Term  5   Preterm  0   AB  0   Living  5     SAB  0   TAB  0   Ectopic  0   Multiple  0   Live Births  5           Social History Social History   Socioeconomic History  . Marital status: Married    Spouse name: Not on file  . Number of children: Not on file  . Years of education: Not on file  .  Highest education level: Not on file  Occupational History  . Not on file  Social Needs  . Financial resource strain: Not on file  . Food insecurity    Worry: Not on file    Inability: Not on file  . Transportation needs    Medical: Not on file    Non-medical: Not on file  Tobacco Use  . Smoking status: Never Smoker  . Smokeless tobacco: Never Used  Substance and Sexual Activity  . Alcohol use: No  . Drug use: No  . Sexual activity: Yes  Lifestyle  . Physical activity    Days per week: Not on file    Minutes per session: Not on file  . Stress: Not on file  Relationships  . Social [place] phone: Not on file    Gets together: Not on file    Attends religious service: Not on file    Active member of club or organization: Not on file    Attends meetings  of clubs or organizations: Not on file    Relationship status: Not on file  Other Topics Concern  . Not on file  Social History Narrative  . Not on file    Family History: Family History  Problem Relation Age of Onset  . Asthma Son     Allergies: No Known Allergies  Medications Prior to Admission  Medication Sig Dispense Refill Last Dose  . Accu-Chek FastClix Lancets MISC 1 Device by Percutaneous route 4 (four) times daily. 100 each 12   . Blood Glucose Monitoring Suppl (ACCU-CHEK GUIDE) w/Device KIT 1 Device by Does not apply route daily. 1 kit 0   . glucose blood (ACCU-CHEK GUIDE) test strip Use as instructed QID 100 each 12   . metFORMIN (GLUCOPHAGE) 500 MG tablet One tablet by mouth and 2 tablets at bedtime (Patient taking differently: One tablet by mouth in morning and 2 tablets at bedtime) 120 tablet 1   . Prenatal Vit-Fe Fumarate-FA (MULTIVITAMIN-PRENATAL) 27-0.8 MG TABS tablet Take 1 tablet by mouth daily at 12 noon.        Review of Systems   All systems reviewed and negative except as stated in HPI  Blood pressure 110/64, pulse (!) 107, temperature 97.8 F (36.6 C), temperature source  Oral, resp. rate 18, height _0  (1.626 m), weight 88.5 kg, last menstrual period 12/17/2018, unknown if currently breastfeeding. General appearance: alert, cooperative and no distress Lungs: clear to auscultation bilaterally Heart: regular rate and rhythm Abdomen: soft, non-tender; bowel sounds normal Pelvic: n/a Extremities: Homans sign is negative, no sign of DVT DTR's +2 Presentation: cephalic Fetal monitoringBaseline: 140 bpm, Variability: Good {> 6 bpm), Accelerations: Reactive and Decelerations: Absent Uterine activityNone Dilation: 1.5 Effacement (%): Thick Station: -3 Exam by:: Varney Baas, RN   Prenatal labs: ABO, Rh: --/--/PENDING (12/05 1010) Antibody: PENDING (12/05 1010) Rubella: Immune, Immune (07/23 0000) RPR: Nonreactive (09/21 0000)  HBsAg: Negative, Negative (07/23 0000)  HIV: Non-reactive, Non-reactive (07/23 0000)  GBS: Negative/-- (11/18 1057)   Prenatal Transfer Tool  Maternal Diabetes: Yes:  Diabetes Type:  Insulin/Medication controlled Genetic Screening: Declined Maternal Ultrasounds/Referrals: Normal Fetal Ultrasounds or other Referrals:  Referred to Materal Fetal Medicine  Maternal Substance Abuse:  No Significant Maternal Medications:  Meds include: Other: metformin Significant Maternal Lab Results: None and Group B Strep negative  Results for orders placed or performed during the hospital encounter of 09/16/19 (from the past 24 hour(s))  CBC   Collection Time: 09/16/19  9:51 AM  Result Value Ref Range   WBC 6.8 4.0 - 10.5 K/uL   RBC 4.35 3.87 - 5.11 MIL/uL   Hemoglobin 10.4 (L) 12.0 - 15.0 g/dL   HCT 33.0 (L) 36.0 - 46.0 %   MCV 75.9 (L) 80.0 - 100.0 fL   MCH 23.9 (L) 26.0 - 34.0 pg   MCHC 31.5 30.0 - 36.0 g/dL   RDW 15.9 (H) 11.5 - 15.5 %   Platelets 226 150 - 400 K/uL   nRBC 0.0 0.0 - 0.2 %  Type and screen   Collection Time: 09/16/19 10:10 AM  Result Value Ref Range   ABO/RH(D) PENDING    Antibody Screen PENDING    Sample  Expiration      09/19/2019,2359 Performed at [facility], [place] [address] [place], [place] [address]     Patient Active Problem List   Diagnosis Date Noted  . Gestational diabetes 09/16/2019  . Gestational diabetes mellitus (GDM) 08/30/2019  . Supervision of high risk pregnancy, antepartum  07/18/2019  . History of shoulder dystocia in prior pregnancy, currently pregnant in third trimester 10/28/2015  . A2GDM (Glyburide) 07/15/2015    Assessment/Plan:  Eriana Suliman is a 38 y.o. [id number] at 50w0dhere for IOL for A2GDM  #Labor:cytotec, patient reports rapid delivery after AROM. Will perform when able. Hx of shoulder dystocia in 2013. EFW 96% at 36 weeks.  #Pain: Planning epidural #FWB: Cat 1 #ID:  GBS neg #MOF: Both #MOC:unsure, considering IUD #Circ:  nTownsend CNM  09/16/2019, 10:37 AM

## 2019-09-16 NOTE — Progress Notes (Signed)
Called Dr. Darene Lamer to inform her of pt latest blood sugar. It was 143. Dr said to do a fasting blood sugar in the morning. I asked her if we needed to do q4 blood sugars still but she said no just to get the fasting one.

## 2019-09-16 NOTE — Progress Notes (Signed)
CNM at bedside for FHR deceleration. 2 minute deceleration after multiple contractions back to back. IV Bolus and position changes resolved deceleration and back to fetal baseline.   Wende Mott, CNM 09/16/19

## 2019-09-17 LAB — GLUCOSE, CAPILLARY: Glucose-Capillary: 103 mg/dL — ABNORMAL HIGH (ref 70–99)

## 2019-09-17 NOTE — Lactation Note (Signed)
This note was copied from a baby's chart. Lactation Consultation Note  Patient Name: Girl Naia Ruff BOFBP'Z Date: 09/17/2019 Reason for consult: Follow-up assessment;Term;Maternal endocrine disorder Type of Endocrine Disorder?: Diabetes(GDM on metformin)  69 hours old FT female who is being mostly formula fed by her mother, she's a P6 with Hx of GDM on metformin but baby's serum glucose has been WNL so far at 53 and 46. She started putting baby to breast today but no LATCH score has yet been recorded. Offered assistance with latch but mom politely declined, stating that baby already fed. Asked mom to call for assistance when needed. Reviewed normal newborn behavior, feeding cues and supply/demand. Encouraged mom to put baby to breast first before offering formula, will call PRN.  Maternal Data    Feeding Feeding Type: Breast Fed Nipple Type: Slow - flow  LATCH Score                   Interventions Interventions: Breast feeding basics reviewed  Lactation Tools Discussed/Used     Consult Status Consult Status: PRN Follow-up type: In-patient    Erman Thum Francene Boyers 09/17/2019, 9:33 PM

## 2019-09-17 NOTE — Progress Notes (Signed)
POSTPARTUM PROGRESS NOTE  Subjective: Erika Potts is a 38 y.o. Z6X0960 s/p vaginal delivery at [redacted]w[redacted]d.  She reports she doing well. No acute events overnight. She denies any problems with ambulating, voiding or po intake. Denies nausea or vomiting. She has passed flatus. Pain is well controlled.  Lochia is appropriate.  Objective: Blood pressure (!) 99/53, pulse 73, temperature 98 F (36.7 C), temperature source Oral, resp. rate 16, height 5\' 4"  (1.626 m), weight 88.5 kg, last menstrual period 12/17/2018, SpO2 100 %, unknown if currently breastfeeding.  Physical Exam:  General: alert, cooperative and no distress Chest: no respiratory distress Abdomen: soft, non-tender  Uterine Fundus: firm, appropriately tender Extremities: No calf swelling or tenderness  No edema  Recent Labs    09/16/19 0951  HGB 10.4*  HCT 33.0*    Assessment/Plan: Erika Potts is a 38 y.o. A5W0981 s/p vaginal delivery at [redacted]w[redacted]d.  Routine Postpartum Care: Doing well, pain well-controlled.  -- Continue routine care, lactation support  -- Contraception: [facility]UD at [facility] visit, message sent to clinic -- Feeding: breast and bottle  Dispo: Plan for discharge tomorrow.  Merilyn Baba, DO OB/GYN Fellow, [facility] for [facility]

## 2019-09-17 NOTE — Lactation Note (Addendum)
This note was copied from a baby's chart. Lactation Consultation Note  Patient Name: Erika Potts FYBOF'B Date: 09/17/2019 Reason for consult: Initial assessment;Term P6, 5 hour termed female infant. Mom hx: GDM on metformin, endocrine Mom's feeding choice is breast and formula feeding. Mom is active on the [facility] program in [facility] and she has DEBP at home. Per mom, she breast and formula fed her other 5 children for 15 months. LC notice mom has formula fed infant twice since birth. Per mom, she  plans to give formula only tonight she doesn't want to latch infant to breast she is tired and will breastfed infant  in the morning. Per mom, she knows how to hand express. Mom knows to breastfeed infant according to hunger cues , 8 to 12 times within 24 hours and on demand. Reviewed Baby & Me book's Breastfeeding Basics.  Mom made aware of O/P services, breastfeeding support groups, community resources, and our phone # for post-discharge questions.    Maternal Data Formula Feeding for Exclusion: Yes Reason for exclusion: Mother's choice to formula and breast feed on admission Has patient been taught Hand Expression?: (Per mom, she knows how to hand express.) Does the patient have breastfeeding experience prior to this delivery?: Yes  Feeding    LATCH Score                   Interventions Interventions: Breast feeding basics reviewed;Hand express;Skin to skin  Lactation Tools Discussed/Used WIC Program: Yes   Consult Status Consult Status: Follow-up Date: 09/17/19 Follow-up type: In-patient    Erika Potts 09/17/2019, 2:01 AM

## 2019-09-18 LAB — BIRTH TISSUE RECOVERY COLLECTION (PLACENTA DONATION)

## 2019-09-18 LAB — GLUCOSE, CAPILLARY: Glucose-Capillary: 143 mg/dL — ABNORMAL HIGH (ref 70–99)

## 2019-09-18 MED ORDER — IBUPROFEN 600 MG PO TABS: 600.0000 mg | ORAL_TABLET | Freq: Four times a day (QID) | ORAL | 0 refills | Status: DC

## 2019-09-18 MED ORDER — ACETAMINOPHEN 325 MG PO TABS: 650.0000 mg | ORAL_TABLET | ORAL | 0 refills | Status: DC | PRN

## 2019-09-18 NOTE — Lactation Note (Signed)
This note was copied from a baby's chart. Lactation Consultation Note  Patient Name: Erika Potts HMCNO'B Date: 09/18/2019 Reason for consult: Follow-up assessment;Term;Infant weight loss;Other (Comment)(experienced breast feeder) Type of Endocrine Disorder?: Diabetes  Baby is 68 hours old   for D/C today .  Breast / formula. Per mom baby recently fed the bottle so the hearing  Screen could be done. Baby asleep.  LC reviewed sore nipple and engorgement prevention and tx.  LC instructed mom on the use hand pump- / checked flange #24 F good fit for  Today and #27 F provided for when the milk comes in. Also shells between  Feedings except when sleeping.  Per mom left nipple alittle sore. LC offered to assess when showing mom how to use the hand pump and no break down noted , nipples erect / areola compressible.    Maternal Data Has patient been taught Hand Expression?: (mom experenced)  Feeding Feeding Type: (per mom recently fed the baby) Nipple Type: Slow - flow  LATCH Score                   Interventions Interventions: Breast feeding basics reviewed;Hand pump;Shells  Lactation Tools Discussed/Used Tools: Pump;Shells;Flanges Flange Size: 24;27(#24 F good fit for today - #27 provided for when the milk comes in) Shell Type: Inverted Breast pump type: Manual Pump Review: Setup, frequency, and cleaning;Milk Storage Initiated by:: MAI Date initiated:: 09/18/19   Consult Status Consult Status: Complete Date: 09/18/19    Jerlyn Ly Taytum Scheck 09/18/2019, 10:10 AM

## 2019-09-18 NOTE — Discharge Instructions (Signed)

## 2019-09-18 NOTE — Plan of Care (Signed)
Patient appropriate for discharge.

## 2019-10-25 ENCOUNTER — Other Ambulatory Visit: Payer: Self-pay | Admitting: Lactation Services

## 2019-10-25 DIAGNOSIS — O24415 Gestational diabetes mellitus in pregnancy, controlled by oral hypoglycemic drugs: Secondary | ICD-10-CM

## 2019-10-30 ENCOUNTER — Other Ambulatory Visit: Payer: 59

## 2019-10-30 ENCOUNTER — Encounter: Payer: Self-pay | Admitting: Nurse Practitioner

## 2019-10-30 ENCOUNTER — Ambulatory Visit (INDEPENDENT_AMBULATORY_CARE_PROVIDER_SITE_OTHER): Payer: 59 | Admitting: Nurse Practitioner

## 2019-10-30 ENCOUNTER — Other Ambulatory Visit: Payer: Self-pay

## 2019-10-30 DIAGNOSIS — Z6827 Body mass index (BMI) 27.0-27.9, adult: Secondary | ICD-10-CM

## 2019-10-30 DIAGNOSIS — M545 Low back pain, unspecified: Secondary | ICD-10-CM | POA: Insufficient documentation

## 2019-10-30 DIAGNOSIS — O24415 Gestational diabetes mellitus in pregnancy, controlled by oral hypoglycemic drugs: Secondary | ICD-10-CM

## 2019-10-30 DIAGNOSIS — Z789 Other specified health status: Secondary | ICD-10-CM

## 2019-10-30 DIAGNOSIS — Z1389 Encounter for screening for other disorder: Secondary | ICD-10-CM

## 2019-10-30 DIAGNOSIS — Z30011 Encounter for initial prescription of contraceptive pills: Secondary | ICD-10-CM | POA: Insufficient documentation

## 2019-10-30 HISTORY — DX: Low back pain, unspecified: M54.50

## 2019-10-30 MED ORDER — PRENATAL 27-0.8 MG PO TABS: 1.0000 | ORAL_TABLET | Freq: Every day | ORAL | 11 refills | Status: DC

## 2019-10-30 MED ORDER — NORETHINDRONE 0.35 MG PO TABS: 1.0000 | ORAL_TABLET | Freq: Every day | ORAL | 6 refills | Status: DC

## 2019-10-30 NOTE — Progress Notes (Signed)
Subjective:     Erika Potts is a 39 y.o. female who presents for a postpartum visit. She is 6 weeks postpartum following a spontaneous vaginal delivery. I have fully reviewed the prenatal and intrapartum course. The delivery was at 39 gestational weeks by induction due to gestational diabetes on metformin.  Client was discharged from the hospital on Metformin but has since stopped checking her blood sugars and has stopped taking the metformin. Outcome: spontaneous vaginal delivery. Anesthesia: none. Postpartum course has been good. Baby's course has been good Baby is feeding by breast. Bleeding no bleeding. Bowel function is normal. Bladder function is normal. Patient is not sexually active. Contraception method is none. Postpartum depression screening: negative.  The following portions of the patient's history were reviewed and updated as appropriate: allergies, current medications, past family history, past medical history, past social history, past surgical history and problem list.  Review of Systems Pertinent items noted in HPI and remainder of comprehensive ROS otherwise negative.   Objective:    BP 108/74   Pulse 83   Ht 5\' 4"  (1.626 m)   Wt 161 lb 12.8 oz (73.4 kg)   Breastfeeding Yes   BMI 27.77 kg/m   General:  alert, cooperative and no distress   Breasts:  not examined  Lungs: clear to auscultation bilaterally  Heart:  regular rate and rhythm, S1, S2 normal, no murmur, click, rub or gallop  Abdomen: not examined   Vulva:  not evaluated  Vagina: not evaluated  Cervix:  not evaluated  Corpus: not examined  Adnexa:  not evaluated  Rectal Exam: Not performed.      Assessment:     Normal postpartum exam. Pap smear not done at today's visit. Pap due 04-2022 Breastfeeding BMI 27 Beginning birth control pills  Back pain  Plan:    1. Contraception: oral progesterone-only contraceptive 2. Will consider if she wants the IUD.  POP prescribed today.  Will return in 3-6  months if she wants the IUD or wants refill of pills. Advised weight loss to BMI below 25 - 145 pounds to reduce risk of diabetes and hypertension later in life.  2 hr glucola done today.  Having back pain likely musculoskeletal in origin.  Discussed exercises, ice packs and massage and direct pressure to alleviate back pain. 3. Follow up in: 3 months or as needed.    09-09-1979, RN, MSN, NP-BC Nurse Practitioner, [facility] for [facility] 10/30/2019 9:52 AM

## 2019-10-30 NOTE — Progress Notes (Signed)
Pt states has changed her mind, wants Pills first & then she will decide if she wants the IUD.

## 2019-10-30 NOTE — Patient Instructions (Addendum)
Intrauterine Device Information An intrauterine device (IUD) is a medical device that is inserted in the uterus to prevent pregnancy. It is a small, T-shaped device that has one or two nylon strings hanging down from it. The strings hang out of the lower part of the uterus (cervix) to allow for future IUD removal. There are two types of IUDs available:  Hormone IUD. This type of IUD is made of plastic and contains the hormone progestin (synthetic progesterone). A hormone IUD may last 3-5 years.  Copper IUD. This type of IUD has copper wire wrapped around it. A copper IUD may last up to 10 years. How is an IUD inserted? An IUD is inserted through the vagina and placed into the uterus with a minor medical procedure. The exact procedure for IUD insertion may vary among health care providers and hospitals. How does an IUD work? Synthetic progesterone in a hormonal IUD prevents pregnancy by:  Thickening cervical mucus to prevent sperm from entering the uterus.  Thinning the uterine lining to prevent a fertilized egg from being implanted there. Copper in a copper IUD prevents pregnancy by making the uterus and fallopian tubes produce a fluid that kills sperm. What are the advantages of an IUD? Advantages of either type of IUD  It is highly effective in preventing pregnancy.  It is reversible. You can become pregnant shortly after the IUD is removed.  It is low-maintenance and can stay in place for a long time.  There are no estrogen-related side effects.  It can be used when breastfeeding.  It is not associated with weight gain.  It can be inserted right after childbirth, an abortion, or a miscarriage. Advantages of a hormone IUD  If it is inserted within 7 days of your period starting, it works right after it is inserted. If the hormone IUD is inserted at any other time in your cycle, you will need to use a backup method of birth control for 7 days after insertion.  It can make  menstrual periods lighter.  It can reduce menstrual cramping.  It can be used for 3-5 years. Advantages of a copper IUD  It works right after it is inserted.  It can be used as a form of emergency birth control if it is inserted within 5 days after having unprotected sex.  It does not interfere with your body's natural hormones.  It can be used for 10 years. What are the disadvantages of an IUD?  An IUD may cause irregular menstrual bleeding for a period of time after insertion.  You may have pain during insertion and have cramping and vaginal bleeding after insertion.  An IUD may cut the uterus (uterine perforation) when it is inserted. This is rare.  An IUD may cause pelvic inflammatory disease (PID), which is an infection in the uterus and fallopian tubes. This is rare, and it usually happens during the first 20 days after the IUD is inserted.  A copper IUD can make your menstrual flow heavier and more painful. How is an IUD removed?  You will lie on your back with your knees bent and your feet in footrests (stirrups).  A device will be inserted into your vagina to spread apart the vaginal walls (speculum). This will allow your health care provider to see the strings attached to the IUD.  Your health care provider will use a small instrument (forceps) to grasp the IUD strings and pull firmly until the IUD is removed. You may have some discomfort   when the IUD is removed. Your health care provider may recommend taking over-the-counter pain relievers, such as ibuprofen, before the procedure. You may also have minor spotting for a few days after the procedure. The exact procedure for IUD removal may vary among health care providers and hospitals. Is the IUD right for me? Your health care provider will make sure you are a good candidate for an IUD and will discuss the advantages, disadvantages, and possible side effects with you. Summary  An intrauterine device (IUD) is a medical  device that is inserted in the uterus to prevent pregnancy. It is a small, T-shaped device that has one or two nylon strings hanging down from it.  A hormone IUD contains the hormone progestin (synthetic progesterone). A copper IUD has copper wire wrapped around it.  Synthetic progesterone in a hormone IUD prevents pregnancy by thickening cervical mucus and thinning the walls of the uterus. Copper in a copper IUD prevents pregnancy by making the uterus and fallopian tubes produce a fluid that kills sperm.  A hormone IUD can be left in place for 3-5 years. A copper IUD can be left in place for up to 10 years.  An IUD is inserted and removed by a health care provider. You may feel some pain during insertion and removal. Your health care provider may recommend taking over-the-counter pain medicine, such as ibuprofen, before an IUD procedure. This information is not intended to replace advice given to you by your health care provider. Make sure you discuss any questions you have with your health care provider. Document Revised: 09/10/2017 Document Reviewed: 10/27/2016 Elsevier Patient Education  [place].  Back Exercises These exercises help to make your trunk and back strong. They also help to keep the lower back flexible. Doing these exercises can help to prevent back pain or lessen existing pain.  If you have back pain, try to do these exercises 2-3 times each day or as told by your doctor.  As you get better, do the exercises once each day. Repeat the exercises more often as told by your doctor.  To stop back pain from coming back, do the exercises once each day, or as told by your doctor. Exercises Single knee to chest Do these steps 3-5 times in a row for each leg: 1. Lie on your back on a firm bed or the floor with your legs stretched out. 2. Bring one knee to your chest. 3. Grab your knee or thigh with both hands and hold them it in place. 4. Pull on your knee until you feel  a gentle stretch in your lower back or buttocks. 5. Keep doing the stretch for 10-30 seconds. 6. Slowly let go of your leg and straighten it. Pelvic tilt Do these steps 5-10 times in a row: 1. Lie on your back on a firm bed or the floor with your legs stretched out. 2. Bend your knees so they point up to the ceiling. Your feet should be flat on the floor. 3. Tighten your lower belly (abdomen) muscles to press your lower back against the floor. This will make your tailbone point up to the ceiling instead of pointing down to your feet or the floor. 4. Stay in this position for 5-10 seconds while you gently tighten your muscles and breathe evenly. Cat-cow Do these steps until your lower back bends more easily: 1. Get on your hands and knees on a firm surface. Keep your hands under your shoulders, and keep your knees  under your hips. You may put padding under your knees. 2. Let your head hang down toward your chest. Tighten (contract) the muscles in your belly. Point your tailbone toward the floor so your lower back becomes rounded like the back of a cat. 3. Stay in this position for 5 seconds. 4. Slowly lift your head. Let the muscles of your belly relax. Point your tailbone up toward the ceiling so your back forms a sagging arch like the back of a cow. 5. Stay in this position for 5 seconds.  Press-ups Do these steps 5-10 times in a row: 1. Lie on your belly (face-down) on the floor. 2. Place your hands near your head, about shoulder-width apart. 3. While you keep your back relaxed and keep your hips on the floor, slowly straighten your arms to raise the top half of your body and lift your shoulders. Do not use your back muscles. You may change where you place your hands in order to make yourself more comfortable. 4. Stay in this position for 5 seconds. 5. Slowly return to lying flat on the floor.  Bridges Do these steps 10 times in a row: 1. Lie on your back on a firm surface. 2. Bend your  knees so they point up to the ceiling. Your feet should be flat on the floor. Your arms should be flat at your sides, next to your body. 3. Tighten your butt muscles and lift your butt off the floor until your waist is almost as high as your knees. If you do not feel the muscles working in your butt and the back of your thighs, slide your feet 1-2 inches farther away from your butt. 4. Stay in this position for 3-5 seconds. 5. Slowly lower your butt to the floor, and let your butt muscles relax. If this exercise is too easy, try doing it with your arms crossed over your chest. Belly crunches Do these steps 5-10 times in a row: 1. Lie on your back on a firm bed or the floor with your legs stretched out. 2. Bend your knees so they point up to the ceiling. Your feet should be flat on the floor. 3. Cross your arms over your chest. 4. Tip your chin a little bit toward your chest but do not bend your neck. 5. Tighten your belly muscles and slowly raise your chest just enough to lift your shoulder blades a tiny bit off of the floor. Avoid raising your body higher than that, because it can put too much stress on your low back. 6. Slowly lower your chest and your head to the floor. Back lifts Do these steps 5-10 times in a row: 1. Lie on your belly (face-down) with your arms at your sides, and rest your forehead on the floor. 2. Tighten the muscles in your legs and your butt. 3. Slowly lift your chest off of the floor while you keep your hips on the floor. Keep the back of your head in line with the curve in your back. Look at the floor while you do this. 4. Stay in this position for 3-5 seconds. 5. Slowly lower your chest and your face to the floor. Contact a doctor if:  Your back pain gets a lot worse when you do an exercise.  Your back pain does not get better 2 hours after you exercise. If you have any of these problems, stop doing the exercises. Do not do them again unless your doctor says it is  okay.  Get help right away if:  You have sudden, very bad back pain. If this happens, stop doing the exercises. Do not do them again unless your doctor says it is okay. This information is not intended to replace advice given to you by your health care provider. Make sure you discuss any questions you have with your health care provider. Document Revised: 06/23/2018 Document Reviewed: 06/23/2018 Elsevier Patient Education  2020 [employer]  Chronic Back Pain When back pain lasts longer than 3 months, it is called chronic back pain. Pain may get worse at certain times (flare-ups). There are things you can do at home to manage your pain. Follow these instructions at home: Activity      Avoid bending and other activities that make pain worse.  When standing: ? Keep your upper back and neck straight. ? Keep your shoulders pulled back. ? Avoid slouching.  When sitting: ? Keep your back straight. ? Relax your shoulders. Do not round your shoulders or pull them backward.  Do not sit or stand in one place for long periods of time.  Take short rest breaks during the day. Lying down or standing is usually better than sitting. Resting can help relieve pain.  When sitting or lying down for a long time, do some mild activity or stretching. This will help to prevent stiffness and pain.  Get regular exercise. Ask your doctor what activities are safe for you.  Do not lift anything that is heavier than 10 lb (4.5 kg). To prevent injury when you lift things: ? Bend your knees. ? Keep the weight close to your body. ? Avoid twisting. Managing pain  If told, put ice on the painful area. Your doctor may tell you to use ice for 24-48 hours after a flare-up starts. ? Put ice in a plastic bag. ? Place a towel between your skin and the bag. ? Leave the ice on for 20 minutes, 2-3 times a day.  If told, put heat on the painful area as often as told by your doctor. Use the heat source that your  doctor recommends, such as a moist heat pack or a heating pad. ? Place a towel between your skin and the heat source. ? Leave the heat on for 20-30 minutes. ? Remove the heat if your skin turns bright red. This is especially important if you are unable to feel pain, heat, or cold. You may have a greater risk of getting burned.  Soak in a warm bath. This can help relieve pain.  Take over-the-counter and prescription medicines only as told by your doctor. General instructions  Sleep on a firm mattress. Try lying on your side with your knees slightly bent. If you lie on your back, put a pillow under your knees.  Keep all follow-up visits as told by your doctor. This is important. Contact a doctor if:  You have pain that does not get better with rest or medicine. Get help right away if:  One or both of your arms or legs feel weak.  One or both of your arms or legs lose feeling (numbness).  You have trouble controlling when you poop (bowel movement) or pee (urinate).  You feel sick to your stomach (nauseous).  You throw up (vomit).  You have belly (abdominal) pain.  You have shortness of breath.  You pass out (faint). Summary  When back pain lasts longer than 3 months, it is called chronic back pain.  Pain may get worse at certain  times (flare-ups).  Use ice and heat as told by your doctor. Your doctor may tell you to use ice after flare-ups. This information is not intended to replace advice given to you by your health care provider. Make sure you discuss any questions you have with your health care provider. Document Revised: 01/19/2019 Document Reviewed: 05/13/2017 Elsevier Patient Education  2020 [employer].

## 2019-10-31 ENCOUNTER — Telehealth: Payer: Self-pay | Admitting: *Deleted

## 2019-10-31 LAB — GLUCOSE TOLERANCE, 2 HOURS
Glucose, 2 hour: 80 mg/dL (ref 65–139)
Glucose, GTT - Fasting: 86 mg/dL (ref 65–99)

## 2019-10-31 NOTE — Telephone Encounter (Addendum)
-----   Message from Currie Paris, NP sent at 10/31/2019 11:40 AM EST ----- Message to clinical staff - Please call and inform client her glucose testing is normal and she does not have diabetes that is continuing after her pregnancy.  She is at  risk of developing diabetes later in life, so it is important to get to keep her .weight at 145 pounds to lessen the risk of developing diabetes again.  1/19  1150  Called pt and left message stating that I am calling to discuss test results. I will call back later today.   1/19  1620  Called and spoke w/pt. I informed her of the above information from Nolene Bernheim, NP. She voiced understanding and expressed gratitude for the information.

## 2020-08-01 IMAGING — US US FETAL BPP W/ NON-STRESS
1 series · 13 of 14 positions shown · non-contrast
Comparison: none

[Series 1: us fetal bpp w/nonstress · 14 acquisitions, 13 frames shown]
[im 1/14]
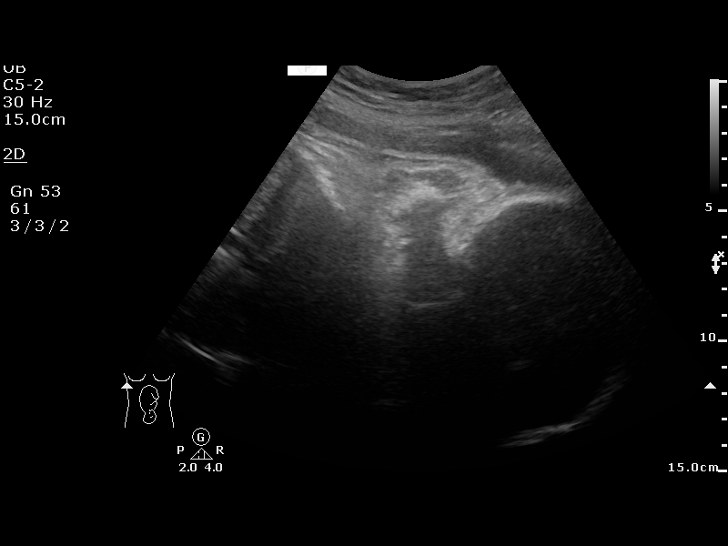
[im 2/14]
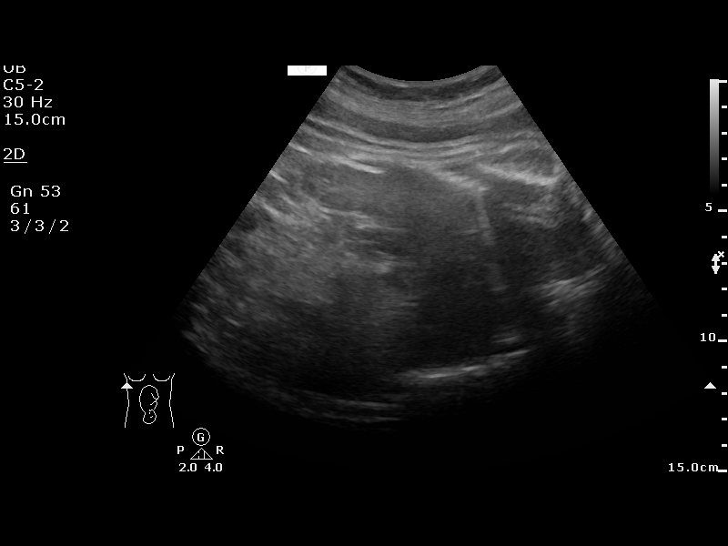
[im 3/14]
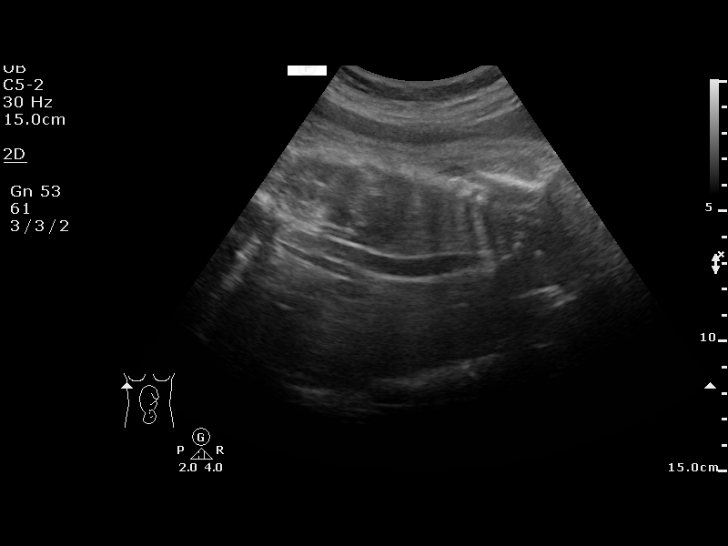
[im 4/14]
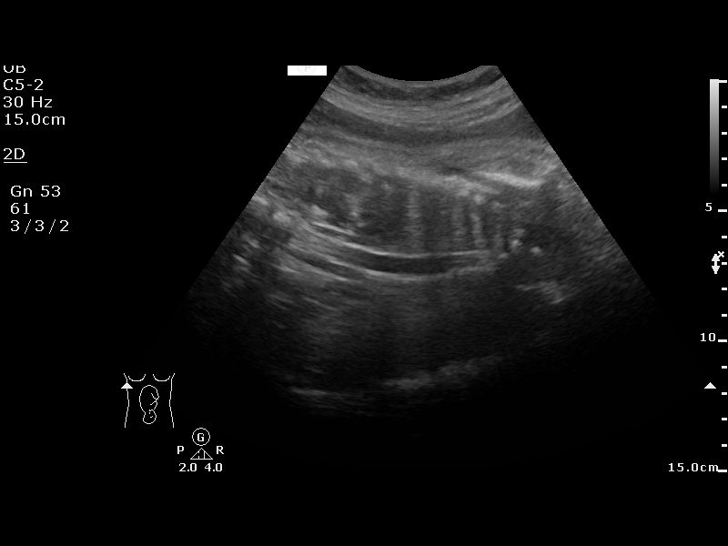
[im 5/14]
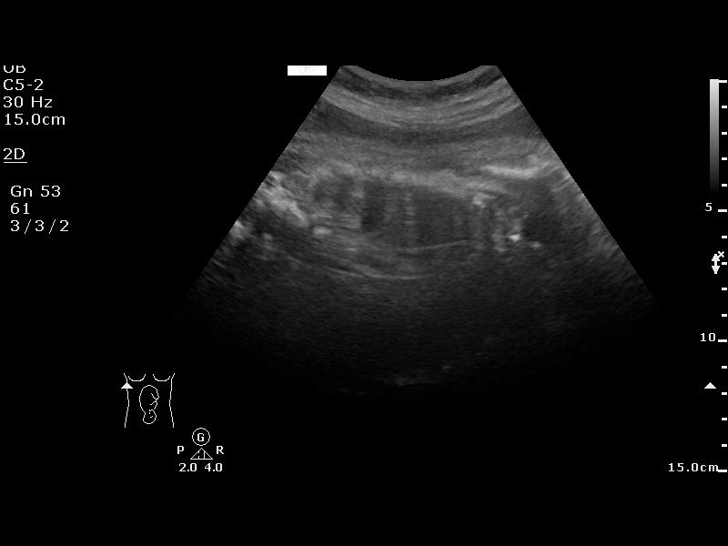
[im 6/14]
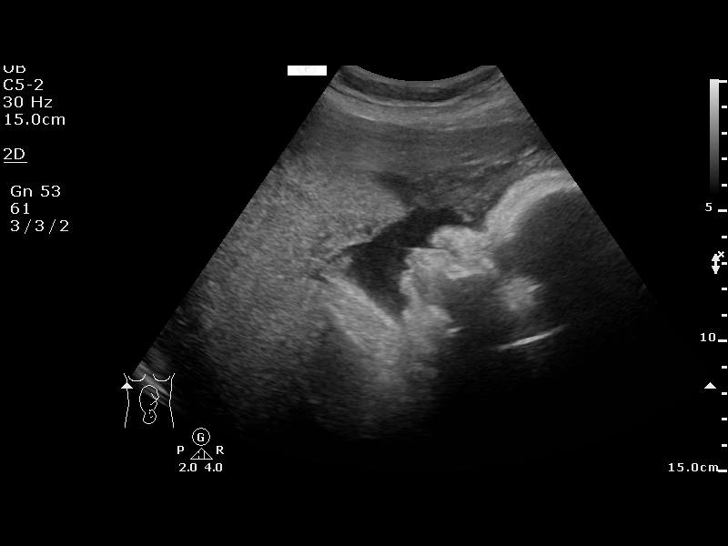
[im 8/14]
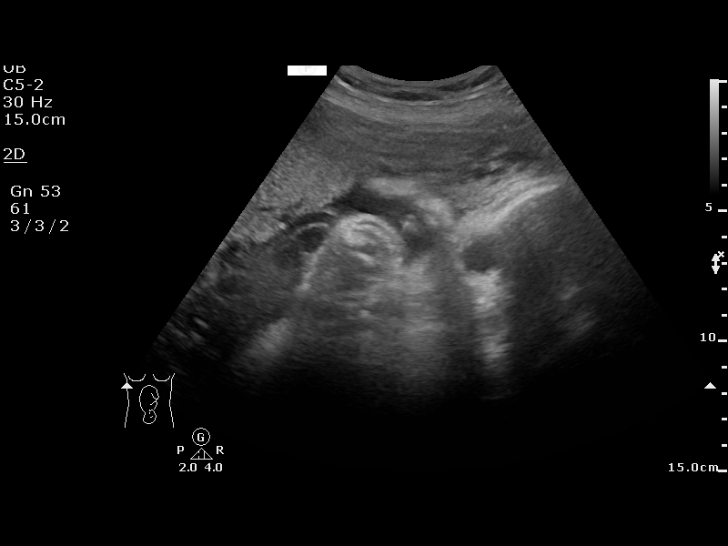
[im 9/14]
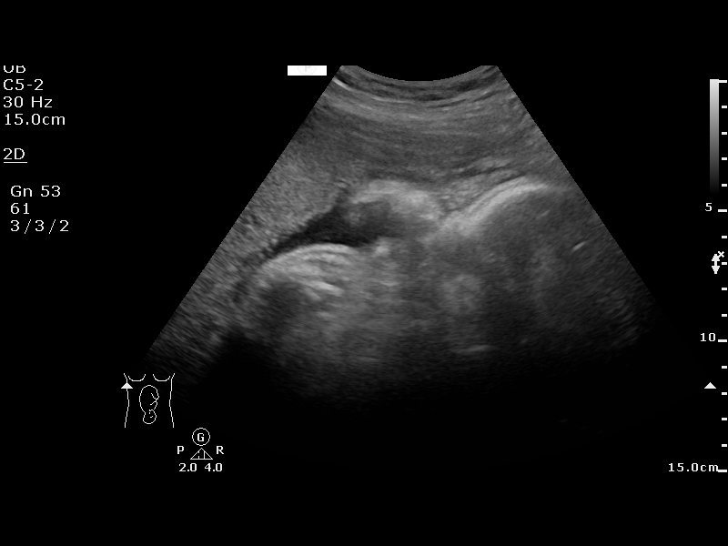
[im 10/14]
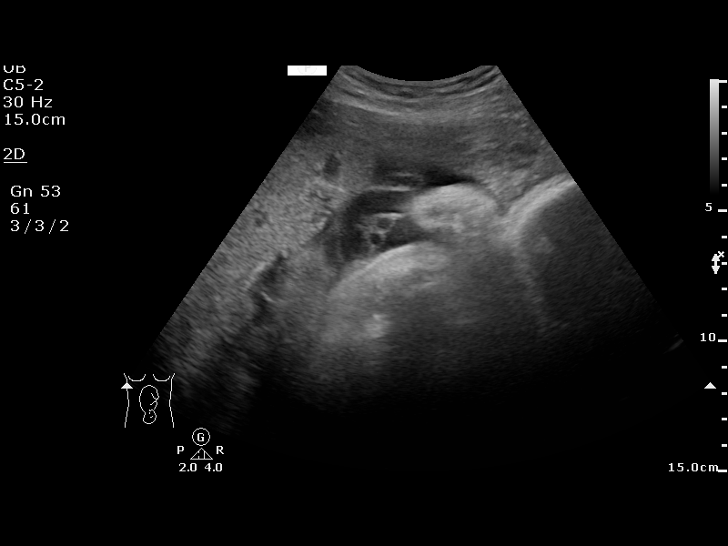
[im 11/14]
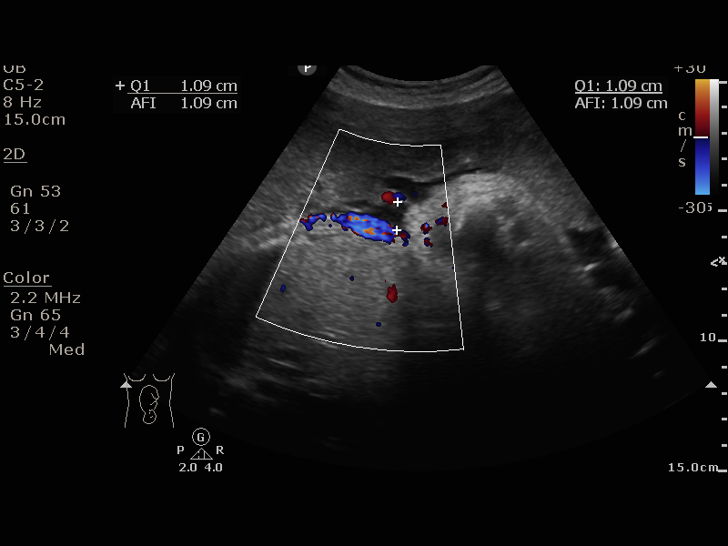
[im 12/14]
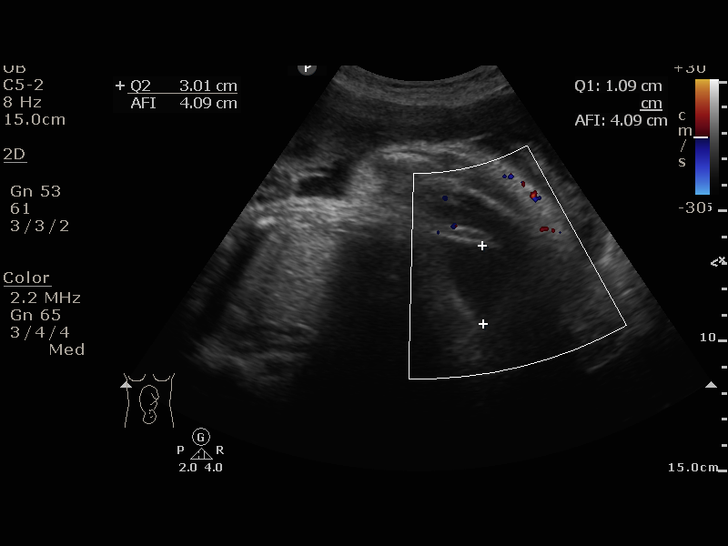
[im 13/14]
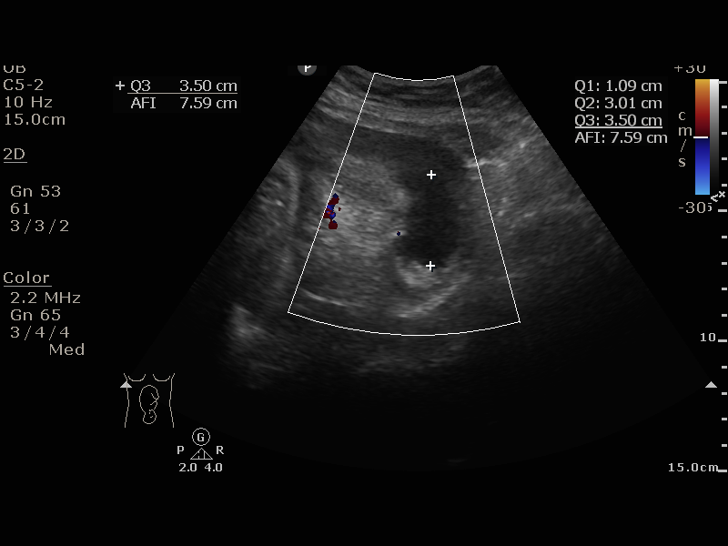
[im 14/14]
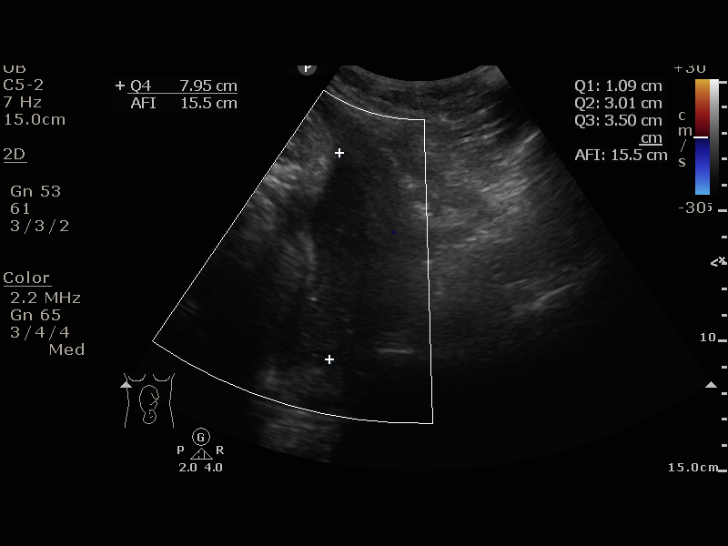

[13 of 14 positions shown; findings below may reference images not displayed]

[address]
 Attending:        Juan De Dios Ricker         [facility]
                                                             [REDACTED]

  1  US FETAL BPP W/NONSTRESS              76818.4     DINIS TIGER
 ----------------------------------------------------------------------

 ----------------------------------------------------------------------
Service(s) Provided

 ----------------------------------------------------------------------
Indications

  38 weeks gestation of pregnancy
  Gestational diabetes in pregnancy,
  controlled by oral hypoglycemic drugs
 ----------------------------------------------------------------------
Fetal Evaluation

 Num Of Fetuses:          1
 Preg. Location:          Intrauterine
 Cardiac Activity:        Observed
 Presentation:            Cephalic

 Amniotic Fluid
 AFI FV:      Within normal limits

 AFI Sum(cm)     %Tile       Largest Pocket(cm)
 15.55           62

 RUQ(cm)       RLQ(cm)       LUQ(cm)        LLQ(cm)

Biophysical Evaluation

 Amniotic F.V:   Pocket => 2 cm             F. Tone:         Observed
 F. Movement:    Observed                   N.S.T:           Reactive
 F. Breathing:   Observed                   Score:           [DATE]
OB History
 Gravidity:    6         Term:   5        Prem:   0        SAB:   0
 TOP:          0       Ectopic:  0        Living: 5
Gestational Age

 LMP:           38w 5d        Date:  12/17/18                 EDD:   09/23/19
 Best:          38w 5d     Det. By:  LMP  (12/17/18)          EDD:   09/23/19
Impression

 Antenatal testing is reassuring with BPP [DATE]. Normal
 amniotic fluid volume.
Recommendations

 Delivery scheduled at 39 weeks
              Adlaho, Deeqa Rayaan

## 2022-10-12 NOTE — L&D Delivery Note (Addendum)
[facility] Delivery Note  Erika Potts is a 42 y.o. [id number] s/p SVD at [redacted]w[redacted]d. She was admitted for A2GDM and LGA.   ROM: 3h 17m with clear fluid GBS Status: Negative/-- (11/07 0934) Maximum Maternal Temperature: Temp (24hrs), Avg:98.4 F (36.9 C), Min:98 F (36.7 C), Max:98.6 F (37 C)   Labor Progress: Initial SVE: closed thick and high. AROM and Cytotec required. She then progressed to complete.   Delivery Date/Time: 08/24/23 1924 Delivery: Called to room and patient was complete and pushing. Head delivered ROA. Loose nuchal cord present and delivered through. Compound hand noted as well. Shoulder and body delivered in usual fashion. Infant with spontaneous cry, placed on mother's abdomen, dried and stimulated. Cord clamped x 2 after 1-minute delay, and cut by myself. Cord blood drawn. Placenta delivered spontaneously with gentle cord traction and fundal massage. Fundus firm with massage and Pitocin. Labia, perineum, and vagina inspected with no lacerations noted .  Baby Weight: pending  Placenta: 3 vessel, intact. Sent to L&D Complications: None Lacerations: none EBL: 83 mL Anesthesia: none  Infant:  APGAR (1 MIN): 7  APGAR (5 MINS): 9  APGAR (10 MINS):    [name] DO PGY2 08/24/2023, 7:48 PM  ______  Patient is a [id number] at [redacted]w[redacted]d who was admitted for IOL due to A2GDM (LGA 91% @ 35wks), significant hx of AMA, hx 30sec SD with 2013 delivery, and onset of care at 27wks.  She progressed with induction via dual dose cytotec followed by AROM.  I was gloved and present for delivery in its entirety.  Second stage of labor progressed, baby delivered after pushing with only 1-2 contractions.  No decels during second stage noted. Given TXA during delivery prophylactically. No difficulty with delivery of shoulder  Complications: none  Lacerations: none  EBL: 83cc  [name], CNM 7:49 PM 08/24/2023

## 2022-12-03 ENCOUNTER — Ambulatory Visit (INDEPENDENT_AMBULATORY_CARE_PROVIDER_SITE_OTHER): Payer: 59 | Admitting: Nurse Practitioner

## 2022-12-03 ENCOUNTER — Ambulatory Visit (HOSPITAL_COMMUNITY)
Admission: RE | Admit: 2022-12-03 | Discharge: 2022-12-03 | Disposition: A | Discharge status: Home / Self Care | Payer: 59 | Source: Ambulatory Visit | Attending: Nurse Practitioner | Admitting: Nurse Practitioner

## 2022-12-03 ENCOUNTER — Encounter: Payer: Self-pay | Admitting: Nurse Practitioner

## 2022-12-03 VITALS — BP 110/54 | HR 87 | Temp 98.0°F | Ht 65.0 in | Wt 192.6 lb

## 2022-12-03 DIAGNOSIS — R053 Chronic cough: Secondary | ICD-10-CM

## 2022-12-03 DIAGNOSIS — Z1159 Encounter for screening for other viral diseases: Secondary | ICD-10-CM | POA: Diagnosis not present

## 2022-12-03 DIAGNOSIS — G8929 Other chronic pain: Secondary | ICD-10-CM | POA: Insufficient documentation

## 2022-12-03 DIAGNOSIS — Z131 Encounter for screening for diabetes mellitus: Secondary | ICD-10-CM | POA: Diagnosis not present

## 2022-12-03 DIAGNOSIS — M25571 Pain in right ankle and joints of right foot: Secondary | ICD-10-CM

## 2022-12-03 DIAGNOSIS — Z1231 Encounter for screening mammogram for malignant neoplasm of breast: Secondary | ICD-10-CM

## 2022-12-03 LAB — POCT GLYCOSYLATED HEMOGLOBIN (HGB A1C): Hemoglobin A1C: 5.9 % — AB (ref 4.0–5.6)

## 2022-12-03 MED ORDER — CETIRIZINE HCL 10 MG PO TABS: 10.0000 mg | ORAL_TABLET | Freq: Every day | ORAL | 11 refills | Status: DC

## 2022-12-03 MED ORDER — OMEPRAZOLE 20 MG PO CPDR: 20.0000 mg | DELAYED_RELEASE_CAPSULE | Freq: Every day | ORAL | 3 refills | Status: DC

## 2022-12-03 MED ORDER — MELOXICAM 7.5 MG PO TABS: 7.5000 mg | ORAL_TABLET | Freq: Every day | ORAL | 0 refills | Status: DC

## 2022-12-03 NOTE — Patient Instructions (Addendum)
1. Need for hepatitis C screening test  - Hepatitis C antibody  2. Diabetes mellitus screening  - Microalbumin/Creatinine Ratio, Urine - CMP14+EGFR - POCT glycosylated hemoglobin (Hb A1C)  3. Chronic pain of right ankle  - DG Ankle Complete Right - main entrance [facility]  4. Chronic cough  - omeprazole (PRILOSEC) 20 MG capsule; Take 1 capsule (20 mg total) by mouth daily.  Dispense: 30 capsule; Refill: 3 - cetirizine (ZYRTEC) 10 MG tablet; Take 1 tablet (10 mg total) by mouth daily.  Dispense: 30 tablet; Refill: 11  Follow up:  Follow up in 3 months

## 2022-12-03 NOTE — Assessment & Plan Note (Signed)
-   Hepatitis C antibody  2. Diabetes mellitus screening  - Microalbumin/Creatinine Ratio, Urine - CMP14+EGFR - POCT glycosylated hemoglobin (Hb A1C)  3. Chronic pain of right ankle  - DG Ankle Complete Right - main entrance [facility] long hospital  4. Chronic cough  - omeprazole (PRILOSEC) 20 MG capsule; Take 1 capsule (20 mg total) by mouth daily.  Dispense: 30 capsule; Refill: 3 - cetirizine (ZYRTEC) 10 MG tablet; Take 1 tablet (10 mg total) by mouth daily.  Dispense: 30 tablet; Refill: 11  Follow up:  Follow up in 3 months

## 2022-12-03 NOTE — Progress Notes (Signed)
$@PatientG$  ID: Erika Potts, female    DOB: 1980/10/20, 42 y.o.   MRN: [id number]  Chief Complaint  Patient presents with   Establish Care    Referring provider: No ref. provider found   HPI  42 year old female with history of gestational diabetes  Patient presents today to establish care.  She has no significant health history other than gestational diabetes during her last 3 pregnancies.  Patient has a total of 6 children.  She complains today of right ankle pain that has been going on for the past couple months.  She tries to walk a lot and has noticed that her right ankle hurts while walking now.  We will order an x-ray for this.  Patient also complains of a chronic cough.  We will trial omeprazole and Zyrtec for this. Denies f/c/s, n/v/d, hemoptysis, PND, leg swelling Denies chest pain or edema         Allergies  Allergen Reactions   Seasonal Ic [Octacosanol]     Immunization History  Administered Date(s) Administered   Influenza,inj,Quad PF,6+ Mos 07/15/2015, 07/18/2019   Tdap 08/19/2015, 07/03/2019    Past Medical History:  Diagnosis Date   A2GDM (Glyburide) 07/15/2015   Hgb A1C = 5.6  - true GDM  INDICATION U/S NST/AFI DELIVERY  Diabetes   A1 - good control - 648.83    A2 - good control    Poor control or poor compliance    (Macrosomia or polyhydramnios)    B-C and A2/B - 648.03    D-R-F-T or poor control B-C  20-38  20-38  20-24-28-32-36   20-24-28-32-35-38//fetal echo  20-24-27-30-33-36-38//fetal echo  40  32//2 x wk  32//2 x wk   32//2 x wk   28//BPP wkly then   Abnormal Pap smear    ASCUS 2007   Gestational diabetes     Tobacco History: Social History   Tobacco Use  Smoking Status Never  Smokeless Tobacco Never   Counseling given: Not Answered   Outpatient Encounter Medications as of 12/03/2022  Medication Sig   cetirizine (ZYRTEC) 10 MG tablet Take 1 tablet (10 mg total) by mouth daily.   meloxicam (MOBIC) 7.5 MG tablet Take 1 tablet (7.5  mg total) by mouth daily.   Multiple Vitamin (MULTIVITAMIN WITH MINERALS) TABS tablet Take 1 tablet by mouth daily.   omeprazole (PRILOSEC) 20 MG capsule Take 1 capsule (20 mg total) by mouth daily.   ibuprofen (ADVIL) 600 MG tablet Take 1 tablet (600 mg total) by mouth every 6 (six) hours. (Patient not taking: Reported on 12/03/2022)   norethindrone (MICRONOR) 0.35 MG tablet Take 1 tablet (0.35 mg total) by mouth daily. (Patient not taking: Reported on 12/03/2022)   Prenatal Vit-Fe Fumarate-FA (MULTIVITAMIN-PRENATAL) 27-0.8 MG TABS tablet Take 1 tablet by mouth daily at 12 noon. (Patient not taking: Reported on 12/03/2022)   No facility-administered encounter medications on file as of 12/03/2022.     Review of Systems  Review of Systems  Constitutional: Negative.   HENT: Negative.    Cardiovascular: Negative.   Gastrointestinal: Negative.   Allergic/Immunologic: Negative.   Neurological: Negative.   Psychiatric/Behavioral: Negative.         Physical Exam  BP (!) 110/54   Pulse 87   Temp 98 F (36.7 C)   Ht 5' 5"$  (1.651 m)   Wt 192 lb 9.6 oz (87.4 kg)   LMP 11/09/2022 (Approximate)   SpO2 100%   BMI 32.05 kg/m   Wt Readings from Last 5  Encounters:  12/03/22 192 lb 9.6 oz (87.4 kg)  10/30/19 161 lb 12.8 oz (73.4 kg)  09/16/19 195 lb (88.5 kg)  09/14/19 196 lb (88.9 kg)  09/06/19 192 lb 6.4 oz (87.3 kg)     Physical Exam Vitals and nursing note reviewed.  Constitutional:      General: She is not in acute distress.    Appearance: She is well-developed.  Cardiovascular:     Rate and Rhythm: Normal rate and regular rhythm.  Pulmonary:     Effort: Pulmonary effort is normal.     Breath sounds: Normal breath sounds.  Neurological:     Mental Status: She is alert and oriented to person, place, and time.      Lab Results:  CBC    Component Value Date/Time   WBC 6.8 09/16/2019 0951   RBC 4.35 09/16/2019 0951   HGB 10.4 (L) 09/16/2019 0951   HGB 10.0 07/03/2019  0000   HCT 33.0 (L) 09/16/2019 0951   HCT 33 07/03/2019 0000   PLT 226 09/16/2019 0951   PLT 235 05/04/2019 0000   PLT 235 05/04/2019 0000   MCV 75.9 (L) 09/16/2019 0951   MCH 23.9 (L) 09/16/2019 0951   MCHC 31.5 09/16/2019 0951   RDW 15.9 (H) 09/16/2019 0951    BMET No results found for: "NA", "K", "CL", "CO2", "GLUCOSE", "BUN", "CREATININE", "CALCIUM", "GFRNONAA", "GFRAA"  BNP No results found for: "BNP"  ProBNP No results found for: "PROBNP"  Imaging: No results found.   Assessment & Plan:   Need for hepatitis C screening test - Hepatitis C antibody  2. Diabetes mellitus screening  - Microalbumin/Creatinine Ratio, Urine - CMP14+EGFR - POCT glycosylated hemoglobin (Hb A1C)  3. Chronic pain of right ankle  - DG Ankle Complete Right - main entrance [facility]  4. Chronic cough  - omeprazole (PRILOSEC) 20 MG capsule; Take 1 capsule (20 mg total) by mouth daily.  Dispense: 30 capsule; Refill: 3 - cetirizine (ZYRTEC) 10 MG tablet; Take 1 tablet (10 mg total) by mouth daily.  Dispense: 30 tablet; Refill: 11  Follow up:  Follow up in 3 months     Fenton Foy, NP 12/03/2022

## 2022-12-04 ENCOUNTER — Other Ambulatory Visit: Payer: Self-pay | Admitting: Nurse Practitioner

## 2022-12-04 DIAGNOSIS — M7731 Calcaneal spur, right foot: Secondary | ICD-10-CM

## 2022-12-04 LAB — MICROALBUMIN / CREATININE URINE RATIO
Creatinine, Urine: 166.7 mg/dL
Microalb/Creat Ratio: 4 mg/g creat (ref 0–29)
Microalbumin, Urine: 7.3 ug/mL

## 2022-12-04 LAB — CMP14+EGFR
ALT: 13 IU/L (ref 0–32)
AST: 15 IU/L (ref 0–40)
Albumin/Globulin Ratio: 1.4 (ref 1.2–2.2)
Albumin: 4.2 g/dL (ref 3.9–4.9)
Alkaline Phosphatase: 61 IU/L (ref 44–121)
BUN/Creatinine Ratio: 13 (ref 9–23)
BUN: 8 mg/dL (ref 6–24)
Bilirubin Total: 0.4 mg/dL (ref 0.0–1.2)
CO2: 22 mmol/L (ref 20–29)
Calcium: 9.5 mg/dL (ref 8.7–10.2)
Chloride: 103 mmol/L (ref 96–106)
Creatinine, Ser: 0.62 mg/dL (ref 0.57–1.00)
Globulin, Total: 2.9 g/dL (ref 1.5–4.5)
Glucose: 127 mg/dL — ABNORMAL HIGH (ref 70–99)
Potassium: 4.4 mmol/L (ref 3.5–5.2)
Sodium: 138 mmol/L (ref 134–144)
Total Protein: 7.1 g/dL (ref 6.0–8.5)
eGFR: 115 mL/min/{1.73_m2} (ref >59)

## 2022-12-04 LAB — HEPATITIS C ANTIBODY: Hep C Virus Ab: NONREACTIVE

## 2022-12-04 NOTE — Progress Notes (Signed)
Called pt and inform results.

## 2022-12-18 ENCOUNTER — Ambulatory Visit (INDEPENDENT_AMBULATORY_CARE_PROVIDER_SITE_OTHER): Payer: 59 | Admitting: Podiatry

## 2022-12-18 ENCOUNTER — Encounter: Payer: Self-pay | Admitting: Podiatry

## 2022-12-18 DIAGNOSIS — M7661 Achilles tendinitis, right leg: Secondary | ICD-10-CM | POA: Diagnosis not present

## 2022-12-18 MED ORDER — METHYLPREDNISOLONE 4 MG PO TBPK: ORAL_TABLET | ORAL | 0 refills | Status: DC

## 2022-12-18 NOTE — Progress Notes (Signed)
   Chief Complaint  Patient presents with   Heel Spurs    New Patient - M77.31 (ICD-10-CM) - Calcaneal spur of foot, right Referring Provider: Fenton Foy - achilles pain x 1 year    HPI: 42 y.o. female presenting today as a new patient with her husband for evaluation of pain and tenderness to the right posterior heel that has been ongoing for about a year now intermittently.  She says it is not constant and not every day.  She denies a history of injury.  She presents for further treatment and evaluation  Past Medical History:  Diagnosis Date   A2GDM (Glyburide) 07/15/2015   Hgb A1C = 5.6  - true GDM  INDICATION U/S NST/AFI DELIVERY  Diabetes   A1 - good control - 648.83    A2 - good control    Poor control or poor compliance    (Macrosomia or polyhydramnios)    B-C and A2/B - 648.03    D-R-F-T or poor control B-C  20-38  20-38  20-24-28-32-36   20-24-28-32-35-38//fetal echo  20-24-27-30-33-36-38//fetal echo  40  32//2 x wk  32//2 x wk   32//2 x wk   28//BPP wkly then   Abnormal Pap smear    ASCUS 2007   Gestational diabetes     Past Surgical History:  Procedure Laterality Date   NO PAST SURGERIES      Allergies  Allergen Reactions   Seasonal Ic [Octacosanol]      Physical Exam: General: The patient is alert and oriented x3 in no acute distress.  Dermatology: Skin is warm, dry and supple bilateral lower extremities. Negative for open lesions or macerations.  Vascular: Palpable pedal pulses bilaterally. No edema or erythema noted. Capillary refill within normal limits.  Neurological: Epicritic and protective threshold grossly intact bilaterally.   Musculoskeletal Exam: Pain on palpation noted to the posterior tubercle of the right calcaneus at the insertion of the Achilles tendon consistent with retrocalcaneal bursitis. Range of motion within normal limits. Muscle strength 5/5 in all muscle groups bilateral lower extremities.  Radiographic Exam RT ankle 12/03/2022:   FINDINGS: There is no evidence of fracture, dislocation, or joint effusion. There is no evidence of arthropathy or other focal bone abnormality. Soft tissues are unremarkable. There are posterior and plantar calcaneal spurs.   IMPRESSION: Posterior and plantar calcaneal spurs. No acute osseous abnormalities.    Assessment: 1. Insertional Achilles tendinitis right 2.  Posterior heel spur right  Plan of Care:  1. Patient was evaluated. Radiographs were reviewed today. 2.  Prescription for Medrol Dosepak, then resume meloxicam 15 mg daily as prescribed 3.  Order placed for physical therapy at [facility] 4.  Advised against going barefoot.  Patient states that she is barefoot throughout the day at home.  Advised against this.  Recommend good supportive tennis shoes and sneakers 5.  Return to clinic as needed   Edrick Kins, DPM [facility]  Dr. Edrick Kins, DPM    [address], [place] [address]                Office [phone]  Fax [phone]

## 2023-01-22 ENCOUNTER — Ambulatory Visit
Admission: RE | Admit: 2023-01-22 | Discharge: 2023-01-22 | Disposition: A | Discharge status: Home / Self Care | Payer: 59 | Source: Ambulatory Visit | Attending: Nurse Practitioner | Admitting: Nurse Practitioner

## 2023-02-11 ENCOUNTER — Ambulatory Visit: Payer: 59 | Admitting: Physical Therapy

## 2023-03-04 ENCOUNTER — Ambulatory Visit: Payer: 59 | Admitting: Nurse Practitioner

## 2023-04-14 ENCOUNTER — Encounter (HOSPITAL_COMMUNITY): Payer: Self-pay | Admitting: *Deleted

## 2023-04-14 ENCOUNTER — Inpatient Hospital Stay (HOSPITAL_COMMUNITY)
Admission: AD | Admit: 2023-04-14 | Discharge: 2023-04-14 | Disposition: A | Discharge status: Home / Self Care | Payer: 59 | Attending: Family Medicine | Admitting: Family Medicine

## 2023-04-14 DIAGNOSIS — O26892 Other specified pregnancy related conditions, second trimester: Secondary | ICD-10-CM | POA: Insufficient documentation

## 2023-04-14 DIAGNOSIS — R350 Frequency of micturition: Secondary | ICD-10-CM | POA: Insufficient documentation

## 2023-04-14 DIAGNOSIS — R300 Dysuria: Secondary | ICD-10-CM | POA: Diagnosis not present

## 2023-04-14 DIAGNOSIS — Z3A18 18 weeks gestation of pregnancy: Secondary | ICD-10-CM | POA: Diagnosis not present

## 2023-04-14 DIAGNOSIS — R519 Headache, unspecified: Secondary | ICD-10-CM | POA: Diagnosis not present

## 2023-04-14 LAB — URINALYSIS, ROUTINE W REFLEX MICROSCOPIC
Bilirubin Urine: NEGATIVE
Glucose, UA: NEGATIVE mg/dL
Hgb urine dipstick: NEGATIVE
Ketones, ur: NEGATIVE mg/dL
Leukocytes,Ua: NEGATIVE
Nitrite: NEGATIVE
Protein, ur: NEGATIVE mg/dL
Specific Gravity, Urine: 1.013 (ref 1.005–1.030)
pH: 6 (ref 5.0–8.0)

## 2023-04-14 LAB — GLUCOSE, CAPILLARY: Glucose-Capillary: 124 mg/dL — ABNORMAL HIGH (ref 70–99)

## 2023-04-14 MED ORDER — ACETAMINOPHEN 325 MG PO TABS
650.0000 mg | ORAL_TABLET | Freq: Once | ORAL | Status: AC
  Administered 2023-04-14: 650 mg via ORAL
  Filled 2023-04-14: qty 2

## 2023-04-14 NOTE — Progress Notes (Addendum)
Wynelle Bourgeois CNM in earlier to discuss test results and d/c plan. Written and verbal d/c instructions given and understanding voiced. To call [address] office for an appt to start [facility]

## 2023-04-14 NOTE — MAU Provider Note (Signed)
Chief Complaint:  Possible Pregnancy   Event Date/Time   First Provider Initiated Contact with Patient 04/14/23 0516     HPI: Erika Potts is a 42 y.o. [id number] at 67w2dwho presents to maternity admissions reporting dysuria, frequency and headache.  Has not started prenatal care yet.  Has gone to [facility] in past for care.   History of GDM.Marland Kitchen She denies LOF, vaginal bleeding, vaginal itching/burning,  dizziness, n/v, diarrhea, or fever/chills.  Possible Pregnancy This is a new problem. Associated symptoms include headaches. Pertinent negatives include no abdominal pain, chest pain, fever, myalgias, nausea or visual change. Associated symptoms comments: Headache, dysuria . She has tried nothing for the symptoms.       RN Note: .Erika Potts is a 42 y.o. at Unknown here in MAU reporting LMP end of February. States has not felt well past few days and has been lightheaded some. Occ h/a and abd pain. Only has h/a now.  Denies LOF or VB. Some constipation. States when she urinates it burns and urinating a lot. Pt  reports having "sugar problems". She states she usually does not go to the doctor until she is half way thru her pregnancies. However, due to her age and sugar issues she is concerned LMP: end of Feb Onset of complaint: 3 days. Pain score: 8   Past Medical History: Past Medical History:  Diagnosis Date   A2GDM (Glyburide) 07/15/2015   Hgb A1C = 5.6  - true GDM  INDICATION U/S NST/AFI DELIVERY  Diabetes   A1 - good control - 648.83    A2 - good control    Poor control or poor compliance    (Macrosomia or polyhydramnios)    B-C and A2/B - 648.03    D-R-F-T or poor control B-C  20-38  20-38  20-24-28-32-36   20-24-28-32-35-38//fetal echo  20-24-27-30-33-36-38//fetal echo  40  32//2 x wk  32//2 x wk   32//2 x wk   28//BPP wkly then   Abnormal Pap smear    ASCUS 2007   Gestational diabetes     Past obstetric history: OB History  Gravida Para Term Preterm AB Living  7 6 6  0 0  6  SAB IAB Ectopic Multiple Live Births  0 0 0 0 6    # Outcome Date GA Lbr Len/2nd Weight Sex Delivery Anes PTL Lv  7 Current           6 Term 09/16/19 [redacted]w[redacted]d 05:07 / 00:02 3805 g F Vag-Spont None  LIV     Birth Comments: WNL  5 Term 11/05/15 [redacted]w[redacted]d 10:15 3425 g F Vag-Spont None  LIV  4 Term 06/25/12 [redacted]w[redacted]d 04:08 / 00:16 4159 g M Vag-Spont None  LIV  3 Term 05/29/08 [redacted]w[redacted]d  3799 g M Vag-Spont   LIV     Birth Comments: GDM, INDUCTION  2 Term 06/05/06 [redacted]w[redacted]d  3856 g M Vag-Spont Gen  LIV  1 Term 08/14/04 [redacted]w[redacted]d  3147 g M Vag-Spont EPI  LIV    Past Surgical History: Past Surgical History:  Procedure Laterality Date   NO PAST SURGERIES      Family History: Family History  Problem Relation Age of Onset   Asthma Son     Social History: Social History   Tobacco Use   Smoking status: Never   Smokeless tobacco: Never  Substance Use Topics   Alcohol use: No   Drug use: No    Allergies:  Allergies  Allergen Reactions   Seasonal Ic [Octacosanol]  Meds:  Medications Prior to Admission  Medication Sig Dispense Refill Last Dose   omeprazole (PRILOSEC) 20 MG capsule Take 1 capsule (20 mg total) by mouth daily. 30 capsule 3 04/13/2023   Prenatal Vit-Fe Fumarate-FA (MULTIVITAMIN-PRENATAL) 27-0.8 MG TABS tablet Take 1 tablet by mouth daily at 12 noon. 30 tablet 11 04/13/2023   cetirizine (ZYRTEC) 10 MG tablet Take 1 tablet (10 mg total) by mouth daily. (Patient not taking: Reported on 04/14/2023) 30 tablet 11 Not Taking   ibuprofen (ADVIL) 600 MG tablet Take 1 tablet (600 mg total) by mouth every 6 (six) hours. (Patient not taking: Reported on 12/03/2022) 30 tablet 0    meloxicam (MOBIC) 7.5 MG tablet Take 1 tablet (7.5 mg total) by mouth daily. (Patient not taking: Reported on 04/14/2023) 30 tablet 0 Not Taking   methylPREDNISolone (MEDROL DOSEPAK) 4 MG TBPK tablet 6 day dose pack - take as directed (Patient not taking: Reported on 04/14/2023) 21 tablet 0 Not Taking   Multiple Vitamin  (MULTIVITAMIN WITH MINERALS) TABS tablet Take 1 tablet by mouth daily. (Patient not taking: Reported on 04/14/2023)   Not Taking   norethindrone (MICRONOR) 0.35 MG tablet Take 1 tablet (0.35 mg total) by mouth daily. (Patient not taking: Reported on 12/03/2022) 1 Package 6     I have reviewed patient's Past Medical Hx, Surgical Hx, Family Hx, Social Hx, medications and allergies.   ROS:  Review of Systems  Constitutional:  Negative for fever.  Cardiovascular:  Negative for chest pain.  Gastrointestinal:  Negative for abdominal pain and nausea.  Genitourinary:  Positive for dysuria and frequency. Negative for flank pain.  Musculoskeletal:  Negative for myalgias.  Neurological:  Positive for headaches.   Other systems negative  Physical Exam  Patient Vitals for the past 24 hrs:  BP Temp Pulse Resp SpO2 Height Weight  04/14/23 0443 116/65 -- -- -- -- -- --  04/14/23 0439 -- 98 F (36.7 C) (!) 108 18 99 % 5\' 4"  (1.626 m) 90.7 kg   Constitutional: Well-developed, well-nourished female in no acute distress.  Cardiovascular: normal rate  Respiratory: normal effort GI: Abd soft, non-tender, gravid appropriate for gestational age.   No rebound or guarding. MS: Extremities nontender, no edema, normal ROM Neurologic: Alert and oriented x 4.  GU: Neg CVAT.  PELVIC EXAM: deferred   FHT:  156  Labs: Results for orders placed or performed during the hospital encounter of 04/14/23 (from the past 24 hour(s))  Urinalysis, Routine w reflex microscopic -Urine, Clean Catch     Status: Abnormal   Collection Time: 04/14/23  5:00 AM  Result Value Ref Range   Color, Urine YELLOW YELLOW   APPearance HAZY (A) CLEAR   Specific Gravity, Urine 1.013 1.005 - 1.030   pH 6.0 5.0 - 8.0   Glucose, UA NEGATIVE NEGATIVE mg/dL   Hgb urine dipstick NEGATIVE NEGATIVE   Bilirubin Urine NEGATIVE NEGATIVE   Ketones, ur NEGATIVE NEGATIVE mg/dL   Protein, ur NEGATIVE NEGATIVE mg/dL   Nitrite NEGATIVE NEGATIVE    Leukocytes,Ua NEGATIVE NEGATIVE  Glucose, capillary     Status: Abnormal   Collection Time: 04/14/23  5:27 AM  Result Value Ref Range   Glucose-Capillary 124 (H) 70 - 99 mg/dL       Imaging:  No results found.  MAU Course/MDM: I have reviewed the triage vital signs and the nursing notes.   Pertinent labs & imaging results that were available during my care of the patient were reviewed by me and  considered in my medical decision making (see chart for details).      I have reviewed her medical records including past results, notes and treatments.   Tylenol relieved headache I have ordered labs and reviewed results.  UA is clear Patient states understanding Discussed prenatal care  WIll schedule [place] prior to new OB for accurate dating  Assessment: Intrauterine pregnancy at [redacted]w[redacted]d by LMP Dysuria, frequency Unsure dates Headache, resolved   Plan: Discharge home [place] for dating Encouraged to arrange [facility] Follow up in Office for prenatal visits and recheck Encouraged to return if she develops worsening of symptoms, increase in pain, fever, or other concerning symptoms.   Pt stable at time of discharge.  Wynelle Bourgeois CNM, MSN Certified Nurse-Midwife 04/14/2023 5:16 AM

## 2023-04-14 NOTE — MAU Note (Addendum)
.  Erika Potts is a 42 y.o. at Unknown here in MAU reporting LMP end of February. States has not felt well past few days and has been lightheaded some. Occ h/a and abd pain. Only has h/a now.  Denies LOF or VB. Some constipation. States when she urinates it burns and urinating a lot. Pt  reports having "sugar problems". She states she usually does not go to the doctor until she is half way thru her pregnancies. However, due to her age and sugar issues she is concerned LMP: end of Feb Onset of complaint: 3 days. Pain score: 8 Vitals:   04/14/23 0439 04/14/23 0443  BP:  116/65  Pulse: (!) 108   Resp: 18   Temp: 98 F (36.7 C)   SpO2: 99%      FHT:156 Lab orders placed from triage: u/a

## 2023-06-18 ENCOUNTER — Ambulatory Visit (INDEPENDENT_AMBULATORY_CARE_PROVIDER_SITE_OTHER): Payer: 59 | Admitting: Advanced Practice Midwife

## 2023-06-18 ENCOUNTER — Other Ambulatory Visit (HOSPITAL_COMMUNITY)
Admission: RE | Admit: 2023-06-18 | Discharge: 2023-06-18 | Disposition: A | Discharge status: Home / Self Care | Payer: 59 | Source: Ambulatory Visit | Attending: Certified Nurse Midwife | Admitting: Certified Nurse Midwife

## 2023-06-18 ENCOUNTER — Other Ambulatory Visit: Payer: Self-pay

## 2023-06-18 ENCOUNTER — Encounter: Payer: Self-pay | Admitting: Advanced Practice Midwife

## 2023-06-18 VITALS — BP 104/72 | HR 112 | Wt 205.0 lb

## 2023-06-18 DIAGNOSIS — Z23 Encounter for immunization: Secondary | ICD-10-CM | POA: Diagnosis not present

## 2023-06-18 DIAGNOSIS — Z1332 Encounter for screening for maternal depression: Secondary | ICD-10-CM | POA: Diagnosis not present

## 2023-06-18 DIAGNOSIS — O09292 Supervision of pregnancy with other poor reproductive or obstetric history, second trimester: Secondary | ICD-10-CM

## 2023-06-18 DIAGNOSIS — O0990 Supervision of high risk pregnancy, unspecified, unspecified trimester: Secondary | ICD-10-CM | POA: Insufficient documentation

## 2023-06-18 DIAGNOSIS — Z3490 Encounter for supervision of normal pregnancy, unspecified, unspecified trimester: Secondary | ICD-10-CM

## 2023-06-18 DIAGNOSIS — O0992 Supervision of high risk pregnancy, unspecified, second trimester: Secondary | ICD-10-CM | POA: Diagnosis not present

## 2023-06-18 DIAGNOSIS — Z3A27 27 weeks gestation of pregnancy: Secondary | ICD-10-CM

## 2023-06-18 DIAGNOSIS — O24419 Gestational diabetes mellitus in pregnancy, unspecified control: Secondary | ICD-10-CM | POA: Diagnosis not present

## 2023-06-18 DIAGNOSIS — O09293 Supervision of pregnancy with other poor reproductive or obstetric history, third trimester: Secondary | ICD-10-CM

## 2023-06-18 LAB — GLUCOSE, CAPILLARY: Glucose-Capillary: 139 mg/dL — ABNORMAL HIGH (ref 70–99)

## 2023-06-18 MED ORDER — METFORMIN HCL 500 MG PO TABS
1000.0000 mg | ORAL_TABLET | Freq: Two times a day (BID) | ORAL | 3 refills | Status: DC
Start: 2023-06-18 — End: 2023-08-26

## 2023-06-18 MED ORDER — ASPIRIN 81 MG PO TBEC
81.0000 mg | DELAYED_RELEASE_TABLET | Freq: Every day | ORAL | 2 refills | Status: DC
Start: 2023-06-18 — End: 2023-08-03

## 2023-06-18 MED ORDER — PRENATAL 27-0.8 MG PO TABS
1.0000 | ORAL_TABLET | Freq: Every day | ORAL | 12 refills | Status: AC
Start: 2023-06-18 — End: ?

## 2023-06-18 NOTE — Patient Instructions (Signed)
TDaP Vaccine Pregnancy Get the Whooping Cough Vaccine While You Are Pregnant (CDC)  It is important for women to get the whooping cough vaccine in the third trimester of each pregnancy. Vaccines are the best way to prevent this disease. There are 2 different whooping cough vaccines. Both vaccines combine protection against whooping cough, tetanus and diphtheria, but they are for different age groups: Tdap: for everyone 11 years or older, including pregnant women  DTaP: for children 2 months through 6 years of age  You need the whooping cough vaccine during each of your pregnancies The recommended time to get the shot is during your 27th through 36th week of pregnancy, preferably during the earlier part of this time period. The Centers for Disease Control and Prevention (CDC) recommends that pregnant women receive the whooping cough vaccine for adolescents and adults (called Tdap vaccine) during the third trimester of each pregnancy. The recommended time to get the shot is during your 27th through 36th week of pregnancy, preferably during the earlier part of this time period. This replaces the original recommendation that pregnant women get the vaccine only if they had not previously received it. The American College of Obstetricians and Gynecologists and the American College of Nurse-Midwives support this recommendation.  You should get the whooping cough vaccine while pregnant to pass protection to your baby frame support disabled and/or not supported in this browser  Learn why Erika Potts decided to get the whooping cough vaccine in her 3rd trimester of pregnancy and how her baby girl was born with some protection against the disease. Also available on YouTube. After receiving the whooping cough vaccine, your body will create protective antibodies (proteins produced by the body to fight off diseases) and pass some of them to your baby before birth. These antibodies provide your baby some short-term  protection against whooping cough in early life. These antibodies can also protect your baby from some of the more serious complications that come along with whooping cough. Your protective antibodies are at their highest about 2 weeks after getting the vaccine, but it takes time to pass them to your baby. So the preferred time to get the whooping cough vaccine is early in your third trimester. The amount of whooping cough antibodies in your body decreases over time. That is why CDC recommends you get a whooping cough vaccine during each pregnancy. Doing so allows each of your babies to get the greatest number of protective antibodies from you. This means each of your babies will get the best protection possible against this disease.  Getting the whooping cough vaccine while pregnant is better than getting the vaccine after you give birth Whooping cough vaccination during pregnancy is ideal so your baby will have short-term protection as soon as he is born. This early protection is important because your baby will not start getting his whooping cough vaccines until he is 2 months old. These first few months of life are when your baby is at greatest risk for catching whooping cough. This is also when he's at greatest risk for having severe, potentially life-threating complications from the infection. To avoid that gap in protection, it is best to get a whooping cough vaccine during pregnancy. You will then pass protection to your baby before he is born. To continue protecting your baby, he should get whooping cough vaccines starting at 2 months old. You may never have gotten the Tdap vaccine before and did not get it during this pregnancy. If so, you should make sure   to get the vaccine immediately after you give birth, before leaving the hospital or birthing center. It will take about 2 weeks before your body develops protection (antibodies) in response to the vaccine. Once you have protection from the vaccine,  you are less likely to give whooping cough to your newborn while caring for him. But remember, your baby will still be at risk for catching whooping cough from others. A recent study looked to see how effective Tdap was at preventing whooping cough in babies whose mothers got the vaccine while pregnant or in the hospital after giving birth. The study found that getting Tdap between 27 through 36 weeks of pregnancy is 85% more effective at preventing whooping cough in babies younger than 2 months old. Blood tests cannot tell if you need a whooping cough vaccine There are no blood tests that can tell you if you have enough antibodies in your body to protect yourself or your baby against whooping cough. Even if you have been sick with whooping cough in the past or previously received the vaccine, you still should get the vaccine during each pregnancy. Breastfeeding may pass some protective antibodies onto your baby By breastfeeding, you may pass some antibodies you have made in response to the vaccine to your baby. When you get a whooping cough vaccine during your pregnancy, you will have antibodies in your breast milk that you can share with your baby as soon as your milk comes in. However, your baby will not get protective antibodies immediately if you wait to get the whooping cough vaccine until after delivering your baby. This is because it takes about 2 weeks for your body to create antibodies. Learn more about the health benefits of breastfeeding.  

## 2023-06-19 LAB — CBC/D/PLT+RPR+RH+ABO+RUBIGG...
Antibody Screen: NEGATIVE
Basophils Absolute: 0 10*3/uL (ref 0.0–0.2)
Basos: 1 %
EOS (ABSOLUTE): 0.3 10*3/uL (ref 0.0–0.4)
Eos: 4 %
HCV Ab: NONREACTIVE
HIV Screen 4th Generation wRfx: NONREACTIVE
Hematocrit: 31 % — ABNORMAL LOW (ref 34.0–46.6)
Hemoglobin: 10 g/dL — ABNORMAL LOW (ref 11.1–15.9)
Hepatitis B Surface Ag: NEGATIVE
Immature Grans (Abs): 0.1 10*3/uL (ref 0.0–0.1)
Immature Granulocytes: 1 %
Lymphocytes Absolute: 1.3 10*3/uL (ref 0.7–3.1)
Lymphs: 15 %
MCH: 25.1 pg — ABNORMAL LOW (ref 26.6–33.0)
MCHC: 32.3 g/dL (ref 31.5–35.7)
MCV: 78 fL — ABNORMAL LOW (ref 79–97)
Monocytes Absolute: 0.5 10*3/uL (ref 0.1–0.9)
Monocytes: 6 %
Neutrophils Absolute: 6.5 10*3/uL (ref 1.4–7.0)
Neutrophils: 73 %
Platelets: 240 10*3/uL (ref 150–450)
RBC: 3.99 x10E6/uL (ref 3.77–5.28)
RDW: 14.4 % (ref 11.7–15.4)
RPR Ser Ql: NONREACTIVE
Rh Factor: POSITIVE
Rubella Antibodies, IGG: 1.27 index (ref >0.99)
WBC: 8.8 10*3/uL (ref 3.4–10.8)

## 2023-06-19 LAB — HCV INTERPRETATION

## 2023-06-19 LAB — HEMOGLOBIN A1C
Est. average glucose Bld gHb Est-mCnc: 148 mg/dL
Hgb A1c MFr Bld: 6.8 % — ABNORMAL HIGH (ref 4.8–5.6)

## 2023-06-21 LAB — CULTURE, OB URINE

## 2023-06-21 LAB — URINE CULTURE, OB REFLEX

## 2023-06-21 LAB — GC/CHLAMYDIA PROBE AMP (~~LOC~~) NOT AT ARMC
Chlamydia: NEGATIVE
Comment: NEGATIVE
Comment: NORMAL
Neisseria Gonorrhea: NEGATIVE

## 2023-06-22 ENCOUNTER — Encounter: Payer: Self-pay | Admitting: Advanced Practice Midwife

## 2023-06-22 DIAGNOSIS — Z3490 Encounter for supervision of normal pregnancy, unspecified, unspecified trimester: Secondary | ICD-10-CM | POA: Insufficient documentation

## 2023-06-22 MED ORDER — ACCU-CHEK GUIDE VI STRP
ORAL_STRIP | 12 refills | Status: DC
Start: 2023-06-22 — End: 2023-08-26

## 2023-06-22 MED ORDER — ACCU-CHEK GUIDE W/DEVICE KIT: 1.0000 | PACK | 0 refills | Status: DC

## 2023-06-22 MED ORDER — ACCU-CHEK SOFTCLIX LANCETS MISC
12 refills | Status: DC
Start: 2023-06-22 — End: 2023-08-26

## 2023-06-23 ENCOUNTER — Telehealth: Payer: Self-pay

## 2023-06-23 MED ORDER — FERROUS SULFATE 325 (65 FE) MG PO TABS: 325.0000 mg | ORAL_TABLET | ORAL | 3 refills | Status: AC

## 2023-06-23 NOTE — Telephone Encounter (Addendum)
-----   Message from [place] sent at 06/22/2023  5:33 PM EDT ----- Please inform pt that her Hgb A1C confirms diabetes diagnosis. She is also anemic. I recommend taking an OTC iron tablet every other day and increasing dietary iron.   Pt notified of results and the need to start iron table.  Pt verbalized understanding with no further questions.   Erika Potts

## 2023-06-28 NOTE — Progress Notes (Signed)
INITIAL PRENATAL VISIT  Subjective:   Erika Potts is 42 y.o. [id number] female at [redacted]w[redacted]d by uncertain LMP being seen today for her first obstetrical visit. She  has not  received other prenatal care previously this pregnancy.  This is a desired pregnancy.  Her obstetrical history is significant for large for gestational age and GDM, shoulder dystocia  . Relationship with FOB: spouse, living together. Patient does intend to breast feed. Pregnancy history fully reviewed.  Has already been checking CBGs due to Hx GDM. All are elevated max ~200.   Review of Systems:   ROS no complaints.  Objective:    Obstetric History OB History  Gravida Para Term Preterm AB Living  7 6 6  0 0 6  SAB IAB Ectopic Multiple Live Births  0 0 0 0 6    # Outcome Date GA Lbr Len/2nd Weight Sex Type Anes PTL Lv  7 Current           6 Term 09/16/19 [redacted]w[redacted]d 05:07 / 00:02 8 lb 6.2 oz (3.805 kg) F Vag-Spont None  LIV     Birth Comments: WNL  5 Term 11/05/15 [redacted]w[redacted]d 10:15 7 lb 8.8 oz (3.425 kg) F Vag-Spont None  LIV  4 Term 06/25/12 [redacted]w[redacted]d 04:08 / 00:16 9 lb 2.7 oz (4.159 kg) M Vag-Spont None  LIV  3 Term 05/29/08 [redacted]w[redacted]d  8 lb 6 oz (3.799 kg) M Vag-Spont   LIV     Birth Comments: GDM, INDUCTION  2 Term 06/05/06 [redacted]w[redacted]d  8 lb 8 oz (3.856 kg) M Vag-Spont Gen  LIV  1 Term 08/14/04 [redacted]w[redacted]d  6 lb 15 oz (3.147 kg) M Vag-Spont EPI  LIV    Past Medical History:  Diagnosis Date   A2GDM (Glyburide) 07/15/2015   Hgb A1C = 5.6  - true GDM  INDICATION U/S NST/AFI DELIVERY  Diabetes   A1 - good control - 648.83    A2 - good control    Poor control or poor compliance    (Macrosomia or polyhydramnios)    B-C and A2/B - 648.03    D-R-F-T or poor control B-C  20-38  20-38  20-24-28-32-36   20-24-28-32-35-38//fetal echo  20-24-27-30-33-36-38//fetal echo  40  32//2 x wk  32//2 x wk   32//2 x wk   28//BPP wkly then   Abnormal Pap smear    ASCUS 2007   Gestational diabetes     Past Surgical History:  Procedure Laterality Date    NO PAST SURGERIES      Current Outpatient Medications on File Prior to Visit  Medication Sig Dispense Refill   cetirizine (ZYRTEC) 10 MG tablet Take 1 tablet (10 mg total) by mouth daily. (Patient not taking: Reported on 04/14/2023) 30 tablet 11   omeprazole (PRILOSEC) 20 MG capsule Take 1 capsule (20 mg total) by mouth daily. (Patient not taking: Reported on 06/18/2023) 30 capsule 3   No current facility-administered medications on file prior to visit.    Allergies  Allergen Reactions   Seasonal Ic [Octacosanol]     Social History:  reports that she has never smoked. She has never used smokeless tobacco. She reports that she does not drink alcohol and does not use drugs.  Family History  Problem Relation Age of Onset   Asthma Son     The following portions of the patient's history were reviewed and updated as appropriate: allergies, current medications, past family history, past medical history, past social history, past surgical history and problem list.  Physical Exam:  BP 104/72   Pulse (!) 112   Wt 205 lb (93 kg)   LMP 12/07/2022 (Approximate) Comment: States her period was the end of February  BMI 35.19 kg/m  CONSTITUTIONAL: Well-developed, well-nourished female in no acute distress.  HENT:  Normocephalic, atraumatic. Oropharynx is clear and moist EYES: Conjunctivae normal. No scleral icterus.  SKIN: Skin is warm and dry. No rash noted. Not diaphoretic. No erythema. No pallor. MUSCULOSKELETAL: Normal range of motion. No tenderness.  No cyanosis, clubbing, or edema.   NEUROLOGIC: Alert and oriented to person, place, and time. Normal muscle tone coordination.  PSYCHIATRIC: Normal mood and affect. Normal behavior. Normal judgment and thought content. CARDIOVASCULAR: Normal heart rate noted. RESPIRATORY: Effort and rate normal. BREASTS: Declined ABDOMEN: Soft, no distention, tenderness, rebound or guarding. Fundal ht: 32 cm PELVIC: declined Fetal Status: Fetal Heart Rate  (bpm): 142   Movement: Present     Indications for ASA therapy (per uptodate) Two or more of the following: Nulliparity No Obesity (body mass index >30 kg/m2) Yes Family history of preeclampsia in mother or sister No Age >=35 years Yes Sociodemographic characteristics (African American race, low socioeconomic level) Yes Personal risk factors (eg, previous pregnancy with low birth weight or small for gestational age infant, previous adverse pregnancy outcome [eg, stillbirth], interval >10 years between pregnancies) No    Assessment:   Pregnancy: G7P6006 1. Supervision of high risk pregnancy, antepartum - TDaP - Culture, OB Urine - GC/Chlamydia probe amp ([place])not at [facility] - CBC/D/Plt+RPR+Rh+ABO+RubIgG... - Hemoglobin A1c - HIV Antibody (routine testing w rflx); Future - RPR; Future - [place] MFM OB DETAIL +14 WK; Future - metFORMIN (GLUCOPHAGE) 500 MG tablet; Take 2 tablets (1,000 mg total) by mouth 2 (two) times daily with a meal. Start with 1 tablet twice a day x 1 week. Then increase to 2 tablets twice a day.  Dispense: 120 tablet; Refill: 3 - aspirin EC 81 MG tablet; Take 1 tablet (81 mg total) by mouth daily. Take after 12 weeks for prevention of preeclampssia later in pregnancy  Dispense: 300 tablet; Refill: 2 - Referral to Nutrition and Diabetes Services  2. GDM, class A2 - Pt already checking CBGs and all are elevated. Discussed that insulin is first-line Tx for GDM. Pt verbalizes understanding and prefers oral meds instead. Rx Metformin.500 BID x 1 wee - metFORMIN (GLUCOPHAGE) 500 MG tablet; Take 2 tablets (1,000 mg total) by mouth 2 (two) times daily with a meal. Start with 1 tablet twice a day x 1 week. Then increase to 2 tablets twice a day.  Dispense: 120 tablet; Refill: 3 - aspirin EC 81 MG tablet; Take 1 tablet (81 mg total) by mouth daily. Take after 12 weeks for prevention of preeclampssia later in pregnancy  Dispense: 300 tablet; Refill: 2 - Referral to Nutrition and  Diabetes Services - Blood Glucose Monitoring Suppl (ACCU-CHEK GUIDE) w/Device KIT; 1 Device by Does not apply route as directed.  Dispense: 1 kit; Refill: 0 - glucose blood (ACCU-CHEK GUIDE) test strip; Use as instructed  Dispense: 100 each; Refill: 12 - Accu-Chek Softclix Lancets lancets; Use as instructed  Dispense: 100 each; Refill: 12  3. Pregnancy with uncertain dates, unspecified trimester - Dating/Anatomy US ordered.   4. History of shoulder dystocia in prior pregnancy, currently pregnant in third trimester - Growth US's. Lengthy discussion about importance of testing blood sugars, bringing log book and keeping appointments. Discussed risks of uncontrolled DM in pregnancy including delayed fetal lung maturity, IUFD and  shoulder dystocia possibly resulting brachial plexus palsy, brain damage, intrapartum death and extensive obstetric lacerations. Patient verbalizes understanding.      Plan:  Initial labs drawn. Prenatal vitamins. Rx ASA for reduction of risk for preeclampsia.  Problem list reviewed and updated. Genetic screening discussed: NIPS/First trimester screen/Quad/AFP declined. Role of ultrasound in pregnancy discussed; Anatomy US: ordered. Amniocentesis discussed: not indicated. Follow up in 2 weeks. (Traditional Discussed clinic routines, schedule of care and testing, genetic screening options, involvement of students and residents under the direct supervision of APPs and doctors and presence of female providers. Pt verbalized understanding.  [place], [place] 06/28/2023 4:03 PM

## 2023-07-05 DIAGNOSIS — O0930 Supervision of pregnancy with insufficient antenatal care, unspecified trimester: Secondary | ICD-10-CM | POA: Insufficient documentation

## 2023-07-05 DIAGNOSIS — O99210 Obesity complicating pregnancy, unspecified trimester: Secondary | ICD-10-CM | POA: Insufficient documentation

## 2023-07-05 DIAGNOSIS — O09529 Supervision of elderly multigravida, unspecified trimester: Secondary | ICD-10-CM | POA: Insufficient documentation

## 2023-07-06 ENCOUNTER — Other Ambulatory Visit: Payer: 59

## 2023-07-09 ENCOUNTER — Ambulatory Visit (INDEPENDENT_AMBULATORY_CARE_PROVIDER_SITE_OTHER): Payer: 59 | Admitting: Obstetrics and Gynecology

## 2023-07-09 ENCOUNTER — Other Ambulatory Visit: Payer: Self-pay

## 2023-07-09 VITALS — BP 103/55 | HR 87 | Wt 201.0 lb

## 2023-07-09 DIAGNOSIS — O0993 Supervision of high risk pregnancy, unspecified, third trimester: Secondary | ICD-10-CM

## 2023-07-09 DIAGNOSIS — Z3A30 30 weeks gestation of pregnancy: Secondary | ICD-10-CM

## 2023-07-09 DIAGNOSIS — O09523 Supervision of elderly multigravida, third trimester: Secondary | ICD-10-CM

## 2023-07-09 DIAGNOSIS — O0990 Supervision of high risk pregnancy, unspecified, unspecified trimester: Secondary | ICD-10-CM

## 2023-07-09 DIAGNOSIS — O09293 Supervision of pregnancy with other poor reproductive or obstetric history, third trimester: Secondary | ICD-10-CM

## 2023-07-09 DIAGNOSIS — O0933 Supervision of pregnancy with insufficient antenatal care, third trimester: Secondary | ICD-10-CM

## 2023-07-09 DIAGNOSIS — O24419 Gestational diabetes mellitus in pregnancy, unspecified control: Secondary | ICD-10-CM

## 2023-07-09 MED ORDER — INSULIN PEN NEEDLE 31G X 5 MM MISC
6 refills | Status: DC
Start: 2023-07-09 — End: 2023-08-09

## 2023-07-09 MED ORDER — INSULIN GLARGINE 100 UNITS/ML SOLOSTAR PEN: 15.0000 [IU] | PEN_INJECTOR | Freq: Every day | SUBCUTANEOUS | 11 refills | Status: DC

## 2023-07-09 NOTE — Progress Notes (Signed)
   PRENATAL VISIT NOTE  Subjective:  Erika Potts is a 42 y.o. G7P6006 at [redacted]w[redacted]d being seen today for ongoing prenatal care.  She is currently monitored for the following issues for this {Blank single:19197::"high-risk","low-risk"} pregnancy and has History of shoulder dystocia in prior pregnancy, currently pregnant in third trimester; GDM, class A2; History of gestational diabetes; Need for hepatitis C screening test; Supervision of high risk pregnancy, antepartum; Pregnancy with uncertain dates, unspecified trimester; AMA (advanced maternal age) multigravida 35+; Obesity affecting pregnancy, antepartum; and Late prenatal care affecting pregnancy on their problem list.  Patient doing ***well with no acute concerns today. She reports {sx:[id number]}.  Contractions: Irritability. Vag. Bleeding: None.  Movement: Present. ***Denies leaking of fluid.   The following portions of the patient's history were reviewed and updated as appropriate: allergies, current medications, past family history, past medical history, past social history, past surgical history and problem list. Problem list updated.  Objective:   Vitals:   07/09/23 0821  BP: (!) 103/55  Pulse: 87  Weight: 201 lb (91.2 kg)    Fetal Status: Fetal Heart Rate (bpm): 144   Movement: Present     General:  Alert, oriented and cooperative. Patient is in no acute distress.  Skin: Skin is warm and dry. No rash noted.   Cardiovascular: Normal heart rate noted  Respiratory: Normal respiratory effort, no problems with respiration noted  Abdomen: Soft, ***gravid, appropriate for gestational age.  Pain/Pressure: Present (pressure)     Pelvic: {Blank single:19197::"Cervical exam performed","Cervical exam deferred"}        Extremities: Normal range of motion.  Edema: None  Mental Status:  Normal mood and affect. Normal behavior. Normal judgment and thought content.   Assessment and Plan:  Pregnancy: G7P6006 at [redacted]w[redacted]d  1. [redacted] weeks gestation  of pregnancy ***  2. GDM, class A2 FBS: 130s PPBS: 90  3. Multigravida of advanced maternal age in third trimester ***  4. History of shoulder dystocia in prior pregnancy, currently pregnant in third trimester ***  5. Late prenatal care affecting pregnancy in third trimester ***  6. Supervision of high risk pregnancy, antepartum ***  {Blank single:19197::"Term","Preterm"} labor symptoms and general obstetric precautions including but not limited to vaginal bleeding, contractions, leaking of fluid and fetal movement were reviewed in detail with the patient.  Please refer to After Visit Summary for other counseling recommendations.   Return in about 2 weeks (around 07/23/2023) for [facility], in person.   Mariel Aloe, MD Faculty Attending Center for [facility]

## 2023-07-12 ENCOUNTER — Other Ambulatory Visit: Payer: Self-pay | Admitting: *Deleted

## 2023-07-12 ENCOUNTER — Ambulatory Visit: Payer: 59 | Attending: Advanced Practice Midwife

## 2023-07-12 ENCOUNTER — Other Ambulatory Visit: Payer: Self-pay | Admitting: Advanced Practice Midwife

## 2023-07-12 ENCOUNTER — Ambulatory Visit: Payer: 59 | Admitting: *Deleted

## 2023-07-12 ENCOUNTER — Encounter: Payer: Self-pay | Admitting: *Deleted

## 2023-07-12 VITALS — BP 103/52 | HR 97

## 2023-07-12 DIAGNOSIS — O24414 Gestational diabetes mellitus in pregnancy, insulin controlled: Secondary | ICD-10-CM

## 2023-07-12 DIAGNOSIS — O403XX0 Polyhydramnios, third trimester, not applicable or unspecified: Secondary | ICD-10-CM | POA: Diagnosis not present

## 2023-07-12 DIAGNOSIS — O09523 Supervision of elderly multigravida, third trimester: Secondary | ICD-10-CM | POA: Insufficient documentation

## 2023-07-12 DIAGNOSIS — O0990 Supervision of high risk pregnancy, unspecified, unspecified trimester: Secondary | ICD-10-CM

## 2023-07-12 DIAGNOSIS — O99210 Obesity complicating pregnancy, unspecified trimester: Secondary | ICD-10-CM | POA: Insufficient documentation

## 2023-07-12 DIAGNOSIS — O0933 Supervision of pregnancy with insufficient antenatal care, third trimester: Secondary | ICD-10-CM

## 2023-07-12 DIAGNOSIS — O0943 Supervision of pregnancy with grand multiparity, third trimester: Secondary | ICD-10-CM | POA: Diagnosis not present

## 2023-07-12 DIAGNOSIS — O24419 Gestational diabetes mellitus in pregnancy, unspecified control: Secondary | ICD-10-CM

## 2023-07-12 DIAGNOSIS — E669 Obesity, unspecified: Secondary | ICD-10-CM

## 2023-07-12 DIAGNOSIS — O99213 Obesity complicating pregnancy, third trimester: Secondary | ICD-10-CM

## 2023-07-12 DIAGNOSIS — Z3A31 31 weeks gestation of pregnancy: Secondary | ICD-10-CM

## 2023-07-19 ENCOUNTER — Encounter: Payer: Self-pay | Admitting: Advanced Practice Midwife

## 2023-07-19 ENCOUNTER — Ambulatory Visit: Payer: 59

## 2023-07-19 ENCOUNTER — Ambulatory Visit: Payer: 59 | Attending: Obstetrics and Gynecology

## 2023-07-19 DIAGNOSIS — O3663X1 Maternal care for excessive fetal growth, third trimester, fetus 1: Secondary | ICD-10-CM | POA: Insufficient documentation

## 2023-07-26 ENCOUNTER — Ambulatory Visit: Payer: 59 | Attending: Obstetrics and Gynecology | Admitting: *Deleted

## 2023-07-26 ENCOUNTER — Ambulatory Visit: Payer: 59

## 2023-07-26 ENCOUNTER — Ambulatory Visit (INDEPENDENT_AMBULATORY_CARE_PROVIDER_SITE_OTHER): Payer: 59 | Admitting: Family Medicine

## 2023-07-26 ENCOUNTER — Encounter: Payer: Self-pay | Admitting: Family Medicine

## 2023-07-26 VITALS — BP 95/52 | HR 89

## 2023-07-26 VITALS — BP 115/73 | HR 102 | Wt 199.0 lb

## 2023-07-26 DIAGNOSIS — O09523 Supervision of elderly multigravida, third trimester: Secondary | ICD-10-CM

## 2023-07-26 DIAGNOSIS — O24419 Gestational diabetes mellitus in pregnancy, unspecified control: Secondary | ICD-10-CM | POA: Insufficient documentation

## 2023-07-26 DIAGNOSIS — O403XX0 Polyhydramnios, third trimester, not applicable or unspecified: Secondary | ICD-10-CM | POA: Diagnosis not present

## 2023-07-26 DIAGNOSIS — O0943 Supervision of pregnancy with grand multiparity, third trimester: Secondary | ICD-10-CM | POA: Diagnosis not present

## 2023-07-26 DIAGNOSIS — O24414 Gestational diabetes mellitus in pregnancy, insulin controlled: Secondary | ICD-10-CM | POA: Diagnosis not present

## 2023-07-26 DIAGNOSIS — E669 Obesity, unspecified: Secondary | ICD-10-CM

## 2023-07-26 DIAGNOSIS — O99213 Obesity complicating pregnancy, third trimester: Secondary | ICD-10-CM

## 2023-07-26 DIAGNOSIS — Z3A33 33 weeks gestation of pregnancy: Secondary | ICD-10-CM | POA: Insufficient documentation

## 2023-07-26 DIAGNOSIS — O0933 Supervision of pregnancy with insufficient antenatal care, third trimester: Secondary | ICD-10-CM | POA: Diagnosis not present

## 2023-07-26 DIAGNOSIS — Z362 Encounter for other antenatal screening follow-up: Secondary | ICD-10-CM | POA: Diagnosis not present

## 2023-07-26 DIAGNOSIS — O3663X1 Maternal care for excessive fetal growth, third trimester, fetus 1: Secondary | ICD-10-CM

## 2023-07-26 DIAGNOSIS — O0990 Supervision of high risk pregnancy, unspecified, unspecified trimester: Secondary | ICD-10-CM

## 2023-07-26 DIAGNOSIS — O0993 Supervision of high risk pregnancy, unspecified, third trimester: Secondary | ICD-10-CM

## 2023-07-26 DIAGNOSIS — O99210 Obesity complicating pregnancy, unspecified trimester: Secondary | ICD-10-CM

## 2023-07-26 DIAGNOSIS — O09293 Supervision of pregnancy with other poor reproductive or obstetric history, third trimester: Secondary | ICD-10-CM

## 2023-07-26 NOTE — Progress Notes (Signed)
   PRENATAL VISIT NOTE  Subjective:  Erika Potts is a 42 y.o. G7P6006 at [redacted]w[redacted]d being seen today for ongoing prenatal care.  She is currently monitored for the following issues for this high-risk pregnancy and has History of shoulder dystocia in prior pregnancy, currently pregnant in third trimester; GDM, class A2; History of gestational diabetes; Need for hepatitis C screening test; Supervision of high risk pregnancy, antepartum; Pregnancy with uncertain dates, unspecified trimester; AMA (advanced maternal age) multigravida 35+; Obesity affecting pregnancy, antepartum; Late prenatal care affecting pregnancy; and LGA (large for gestational age) fetus affecting management of mother, third trimester, fetus 1 on their problem list.  Patient reports no complaints.  Contractions: Irritability. Vag. Bleeding: None.  Movement: Present. Denies leaking of fluid.   The following portions of the patient's history were reviewed and updated as appropriate: allergies, current medications, past family history, past medical history, past social history, past surgical history and problem list.   Objective:   Vitals:   07/26/23 1348  BP: 115/73  Pulse: (!) 102  Weight: 199 lb (90.3 kg)    Fetal Status: Fetal Heart Rate (bpm): 158 Fundal Height: 36 cm Movement: Present     General:  Alert, oriented and cooperative. Patient is in no acute distress.  Skin: Skin is warm and dry. No rash noted.   Cardiovascular: Normal heart rate noted  Respiratory: Normal respiratory effort, no problems with respiration noted  Abdomen: Soft, gravid, appropriate for gestational age.  Pain/Pressure: Present     Pelvic: Cervical exam deferred        Extremities: Normal range of motion.     Mental Status: Normal mood and affect. Normal behavior. Normal judgment and thought content.   Assessment and Plan:  Pregnancy: G7P6006 at [redacted]w[redacted]d 1. Supervision of high risk pregnancy, antepartum Up to date FH elevated  (36) Vigorous movement  2. History of shoulder dystocia in prior pregnancy, currently pregnant in third trimester  3. Multigravida of advanced maternal age in third trimester  4. GDM, class A2 Fasting- 80-112, 8 of the 11 elevated PP 90-139  13 of 36 above goal On metformin but IS NOT taking Lantus and does not want to take lantus She was waking to have a snack at 2-3 AM and this was causing high sugars. But she stopped this this past week (10/8) and notably her BG has improved after that Encouraged a 8 hr fasting period and having only HIGH protein snacks    5. LGA (large for gestational age) fetus affecting management of mother, third trimester, fetus 1 LGA vs dating discrepancy  6. Obesity affecting pregnancy, antepartum, unspecified obesity type TWG=-6 lb (-2.722 kg)    Preterm labor symptoms and general obstetric precautions including but not limited to vaginal bleeding, contractions, leaking of fluid and fetal movement were reviewed in detail with the patient. Please refer to After Visit Summary for other counseling recommendations.   Return in about 2 weeks (around 08/09/2023) for Routine prenatal care.  Future Appointments  Date Time Provider Department Center  08/03/2023  8:15 AM [facility] NURSE [facility]  08/03/2023  8:30 AM [facility] US1 [facility]  08/10/2023  9:15 AM [facility] NURSE [facility]  08/10/2023  9:30 AM [facility] US2 [facility]  08/16/2023  8:00 AM [facility] NURSE [facility]  08/16/2023  8:30 AM [facility] US1 [facility]  08/23/2023  8:15 AM [facility] NURSE [facility]  08/23/2023  8:30 AM [facility] US4 [facility]    Federico Flake, MD

## 2023-08-03 ENCOUNTER — Other Ambulatory Visit: Payer: Self-pay

## 2023-08-03 ENCOUNTER — Ambulatory Visit: Payer: 59

## 2023-08-03 ENCOUNTER — Ambulatory Visit: Payer: 59 | Attending: Obstetrics and Gynecology | Admitting: *Deleted

## 2023-08-03 VITALS — BP 104/56 | HR 89

## 2023-08-03 DIAGNOSIS — O24419 Gestational diabetes mellitus in pregnancy, unspecified control: Secondary | ICD-10-CM

## 2023-08-03 DIAGNOSIS — O0943 Supervision of pregnancy with grand multiparity, third trimester: Secondary | ICD-10-CM | POA: Diagnosis not present

## 2023-08-03 DIAGNOSIS — O09523 Supervision of elderly multigravida, third trimester: Secondary | ICD-10-CM | POA: Diagnosis not present

## 2023-08-03 DIAGNOSIS — Z3A34 34 weeks gestation of pregnancy: Secondary | ICD-10-CM | POA: Insufficient documentation

## 2023-08-03 DIAGNOSIS — O0933 Supervision of pregnancy with insufficient antenatal care, third trimester: Secondary | ICD-10-CM | POA: Insufficient documentation

## 2023-08-03 DIAGNOSIS — E669 Obesity, unspecified: Secondary | ICD-10-CM

## 2023-08-03 DIAGNOSIS — O24414 Gestational diabetes mellitus in pregnancy, insulin controlled: Secondary | ICD-10-CM

## 2023-08-03 DIAGNOSIS — O99213 Obesity complicating pregnancy, third trimester: Secondary | ICD-10-CM | POA: Diagnosis not present

## 2023-08-03 DIAGNOSIS — O0990 Supervision of high risk pregnancy, unspecified, unspecified trimester: Secondary | ICD-10-CM

## 2023-08-09 ENCOUNTER — Ambulatory Visit (INDEPENDENT_AMBULATORY_CARE_PROVIDER_SITE_OTHER): Payer: 59 | Admitting: Obstetrics & Gynecology

## 2023-08-09 VITALS — BP 108/70 | HR 102 | Wt 202.0 lb

## 2023-08-09 DIAGNOSIS — O3663X1 Maternal care for excessive fetal growth, third trimester, fetus 1: Secondary | ICD-10-CM

## 2023-08-09 DIAGNOSIS — O0990 Supervision of high risk pregnancy, unspecified, unspecified trimester: Secondary | ICD-10-CM

## 2023-08-09 DIAGNOSIS — Z3A35 35 weeks gestation of pregnancy: Secondary | ICD-10-CM

## 2023-08-09 DIAGNOSIS — O24419 Gestational diabetes mellitus in pregnancy, unspecified control: Secondary | ICD-10-CM

## 2023-08-09 DIAGNOSIS — O0933 Supervision of pregnancy with insufficient antenatal care, third trimester: Secondary | ICD-10-CM

## 2023-08-09 NOTE — Progress Notes (Signed)
   PRENATAL VISIT NOTE  Subjective:  Erika Potts is a 42 y.o. G7P6006 at [redacted]w[redacted]d being seen today for ongoing prenatal care.  She is currently monitored for the following issues for this high-risk pregnancy and has History of shoulder dystocia in prior pregnancy, currently pregnant in third trimester; GDM, class A2; History of gestational diabetes; Need for hepatitis C screening test; Supervision of high risk pregnancy, antepartum; Pregnancy with uncertain dates, unspecified trimester; AMA (advanced maternal age) multigravida 35+; Obesity affecting pregnancy, antepartum; Late prenatal care affecting pregnancy; and LGA (large for gestational age) fetus affecting management of mother, third trimester, fetus 1 on their problem list.  Patient reports no complaints.  Contractions: Irritability. Vag. Bleeding: None.  Movement: Present. Denies leaking of fluid.   The following portions of the patient's history were reviewed and updated as appropriate: allergies, current medications, past family history, past medical history, past social history, past surgical history and problem list.   Objective:   Vitals:   08/09/23 1538  BP: 108/70  Pulse: (!) 102  Weight: 202 lb (91.6 kg)    Fetal Status: Fetal Heart Rate (bpm): 143   Movement: Present     General:  Alert, oriented and cooperative. Patient is in no acute distress.  Skin: Skin is warm and dry. No rash noted.   Cardiovascular: Normal heart rate noted  Respiratory: Normal respiratory effort, no problems with respiration noted  Abdomen: Soft, gravid, appropriate for gestational age.  Pain/Pressure: Present     Pelvic: Cervical exam deferred        Extremities: Normal range of motion.  Edema: None  Mental Status: Normal mood and affect. Normal behavior. Normal judgment and thought content.   Assessment and Plan:  Pregnancy: G7P6006 at [redacted]w[redacted]d 1. Supervision of high risk pregnancy, antepartum Weekly fetal surveillanc3  2. GDM, class  A2 80% in range PP, 60 % fasting on metformin 1000 BID  3. LGA (large for gestational age) fetus affecting management of mother, third trimester, fetus 1 99% ile  4. Late prenatal care affecting pregnancy in third trimester   Preterm labor symptoms and general obstetric precautions including but not limited to vaginal bleeding, contractions, leaking of fluid and fetal movement were reviewed in detail with the patient. Please refer to After Visit Summary for other counseling recommendations.   Return in about 1 week (around 08/16/2023).  Future Appointments  Date Time Provider Department Center  08/10/2023  9:15 AM [facility] NURSE [facility]  08/10/2023  9:30 AM [facility] US2 [facility]  08/16/2023  8:00 AM [facility] NURSE [facility]  08/16/2023  8:30 AM [facility] US1 [facility]  08/23/2023  8:15 AM [facility] NURSE [facility]  08/23/2023  8:30 AM [facility] US4 [facility]    Scheryl Darter, MD

## 2023-08-10 ENCOUNTER — Other Ambulatory Visit: Payer: Self-pay

## 2023-08-10 ENCOUNTER — Ambulatory Visit: Payer: 59

## 2023-08-10 ENCOUNTER — Ambulatory Visit: Payer: 59 | Attending: Obstetrics

## 2023-08-10 DIAGNOSIS — O24419 Gestational diabetes mellitus in pregnancy, unspecified control: Secondary | ICD-10-CM | POA: Insufficient documentation

## 2023-08-10 DIAGNOSIS — Z3A35 35 weeks gestation of pregnancy: Secondary | ICD-10-CM

## 2023-08-10 DIAGNOSIS — O99213 Obesity complicating pregnancy, third trimester: Secondary | ICD-10-CM | POA: Diagnosis not present

## 2023-08-10 DIAGNOSIS — O09523 Supervision of elderly multigravida, third trimester: Secondary | ICD-10-CM | POA: Diagnosis present

## 2023-08-10 DIAGNOSIS — O24414 Gestational diabetes mellitus in pregnancy, insulin controlled: Secondary | ICD-10-CM

## 2023-08-10 DIAGNOSIS — E669 Obesity, unspecified: Secondary | ICD-10-CM

## 2023-08-10 DIAGNOSIS — O0943 Supervision of pregnancy with grand multiparity, third trimester: Secondary | ICD-10-CM

## 2023-08-10 DIAGNOSIS — O0933 Supervision of pregnancy with insufficient antenatal care, third trimester: Secondary | ICD-10-CM | POA: Diagnosis present

## 2023-08-16 ENCOUNTER — Other Ambulatory Visit: Payer: Self-pay

## 2023-08-16 ENCOUNTER — Ambulatory Visit: Payer: 59

## 2023-08-16 ENCOUNTER — Ambulatory Visit: Payer: 59 | Attending: Obstetrics and Gynecology | Admitting: *Deleted

## 2023-08-16 ENCOUNTER — Encounter: Payer: Self-pay | Admitting: *Deleted

## 2023-08-16 VITALS — BP 102/66 | HR 80

## 2023-08-16 DIAGNOSIS — Z3A36 36 weeks gestation of pregnancy: Secondary | ICD-10-CM | POA: Diagnosis not present

## 2023-08-16 DIAGNOSIS — O0990 Supervision of high risk pregnancy, unspecified, unspecified trimester: Secondary | ICD-10-CM

## 2023-08-16 DIAGNOSIS — O0943 Supervision of pregnancy with grand multiparity, third trimester: Secondary | ICD-10-CM | POA: Insufficient documentation

## 2023-08-16 DIAGNOSIS — O99213 Obesity complicating pregnancy, third trimester: Secondary | ICD-10-CM | POA: Insufficient documentation

## 2023-08-16 DIAGNOSIS — O0933 Supervision of pregnancy with insufficient antenatal care, third trimester: Secondary | ICD-10-CM

## 2023-08-16 DIAGNOSIS — O24419 Gestational diabetes mellitus in pregnancy, unspecified control: Secondary | ICD-10-CM | POA: Insufficient documentation

## 2023-08-16 DIAGNOSIS — O24414 Gestational diabetes mellitus in pregnancy, insulin controlled: Secondary | ICD-10-CM | POA: Diagnosis not present

## 2023-08-16 DIAGNOSIS — O09523 Supervision of elderly multigravida, third trimester: Secondary | ICD-10-CM | POA: Diagnosis not present

## 2023-08-16 DIAGNOSIS — E669 Obesity, unspecified: Secondary | ICD-10-CM

## 2023-08-18 ENCOUNTER — Other Ambulatory Visit (HOSPITAL_COMMUNITY)
Admission: RE | Admit: 2023-08-18 | Discharge: 2023-08-18 | Disposition: A | Discharge status: Home / Self Care | Payer: 59 | Source: Ambulatory Visit | Attending: Obstetrics and Gynecology | Admitting: Obstetrics and Gynecology

## 2023-08-18 ENCOUNTER — Other Ambulatory Visit: Payer: Self-pay

## 2023-08-18 ENCOUNTER — Other Ambulatory Visit: Payer: Self-pay | Admitting: Advanced Practice Midwife

## 2023-08-18 ENCOUNTER — Ambulatory Visit: Payer: 59 | Admitting: Obstetrics and Gynecology

## 2023-08-18 VITALS — BP 101/54 | HR 94 | Wt 199.0 lb

## 2023-08-18 DIAGNOSIS — O24419 Gestational diabetes mellitus in pregnancy, unspecified control: Secondary | ICD-10-CM

## 2023-08-18 DIAGNOSIS — Z3A36 36 weeks gestation of pregnancy: Secondary | ICD-10-CM | POA: Insufficient documentation

## 2023-08-18 DIAGNOSIS — O0993 Supervision of high risk pregnancy, unspecified, third trimester: Secondary | ICD-10-CM | POA: Diagnosis not present

## 2023-08-18 DIAGNOSIS — O99210 Obesity complicating pregnancy, unspecified trimester: Secondary | ICD-10-CM

## 2023-08-18 DIAGNOSIS — O09523 Supervision of elderly multigravida, third trimester: Secondary | ICD-10-CM | POA: Diagnosis not present

## 2023-08-18 DIAGNOSIS — Z3493 Encounter for supervision of normal pregnancy, unspecified, third trimester: Secondary | ICD-10-CM | POA: Insufficient documentation

## 2023-08-18 DIAGNOSIS — O09293 Supervision of pregnancy with other poor reproductive or obstetric history, third trimester: Secondary | ICD-10-CM

## 2023-08-18 DIAGNOSIS — O0990 Supervision of high risk pregnancy, unspecified, unspecified trimester: Secondary | ICD-10-CM

## 2023-08-18 DIAGNOSIS — O99213 Obesity complicating pregnancy, third trimester: Secondary | ICD-10-CM

## 2023-08-18 DIAGNOSIS — O3663X1 Maternal care for excessive fetal growth, third trimester, fetus 1: Secondary | ICD-10-CM

## 2023-08-18 DIAGNOSIS — Z641 Problems related to multiparity: Secondary | ICD-10-CM

## 2023-08-18 DIAGNOSIS — Z6834 Body mass index (BMI) 34.0-34.9, adult: Secondary | ICD-10-CM

## 2023-08-18 NOTE — Progress Notes (Signed)
    PRENATAL VISIT NOTE  Subjective:  Erika Potts is a 42 y.o. G7P6006 at [redacted]w[redacted]d being seen today for ongoing prenatal care.  She is currently monitored for the following issues for this high-risk pregnancy and has History of shoulder dystocia in prior pregnancy, currently pregnant in third trimester; GDM, class A2; Supervision of high risk pregnancy, antepartum; Pregnancy with uncertain dates, unspecified trimester; AMA (advanced maternal age) multigravida 35+; Obesity affecting pregnancy, antepartum; Late prenatal care affecting pregnancy; and LGA (large for gestational age) fetus affecting management of mother, third trimester, fetus 1 on their problem list.  Patient reports no complaints.  Contractions: Not present. Vag. Bleeding: None.  Movement: Present. Denies leaking of fluid.   The following portions of the patient's history were reviewed and updated as appropriate: allergies, current medications, past family history, past medical history, past social history, past surgical history and problem list.   Objective:   Vitals:   08/18/23 1622  BP: (!) 101/54  Pulse: 94  Weight: 199 lb (90.3 kg)    Fetal Status: Fetal Heart Rate (bpm): 137   Movement: Present     General:  Alert, oriented and cooperative. Patient is in no acute distress.  Skin: Skin is warm and dry. No rash noted.   Cardiovascular: Normal heart rate noted  Respiratory: Normal respiratory effort, no problems with respiration noted  Abdomen: Soft, gravid, appropriate for gestational age.  Pain/Pressure: Absent     Pelvic: Cervical exam deferred        Extremities: Normal range of motion.  Edema: None  Mental Status: Normal mood and affect. Normal behavior. Normal judgment and thought content.   Assessment and Plan:  Pregnancy: G7P6006 at [redacted]w[redacted]d 1. [redacted] weeks gestation of pregnancy Patient prefers self swab. BC undecided. Lysteda at delivery - Strep Gp B NAA - Cervicovaginal ancillary only( [place])  2.  Multigravida of advanced maternal age in third trimester Continue qwk testing 11/4: ceph, 8/8, afi 12.6 10/29: 91%, 3075gm, ac 97%, afi 22.6, cephalic  3. GDM, class A2 See above. Recommend 37wk IOL given GDMA2 with LGA and AMA. Pt amenable to this. Patient set up for 11/12 AM IOL Continue with metformin 1000 bid. Pt with normal CBG log with just a few sporadic <10pts above goal  4. Supervision of high risk pregnancy, antepartum  5. LGA (large for gestational age) fetus affecting management of mother, third trimester, fetus 1 See above  6. Obesity affecting pregnancy, antepartum, unspecified obesity type Weight stable  7. BMI 34.0-34.9,adult  8. History of shoulder dystocia in prior pregnancy, currently pregnant in third trimester Back in 2013 with 9lbs 3oz=4100gm; pt had two subsequent TSVDs. Precautions at delivery. D/w her that still high risk for repeat occurence  Preterm labor symptoms and general obstetric precautions including but not limited to vaginal bleeding, contractions, leaking of fluid and fetal movement were reviewed in detail with the patient. Please refer to After Visit Summary for other counseling recommendations.   No follow-ups on file.  Future Appointments  Date Time Provider Department Center  08/23/2023  8:15 AM [facility] NURSE [facility]  08/23/2023  8:30 AM [facility] US4 [facility]  08/24/2023  6:45 AM [facility] SCHED ROOM [facility] None    Rockford Bing, MD

## 2023-08-20 ENCOUNTER — Encounter (HOSPITAL_COMMUNITY): Payer: Self-pay | Admitting: *Deleted

## 2023-08-20 ENCOUNTER — Telehealth (HOSPITAL_COMMUNITY): Payer: Self-pay | Admitting: *Deleted

## 2023-08-20 LAB — CERVICOVAGINAL ANCILLARY ONLY
Chlamydia: NEGATIVE
Comment: NEGATIVE
Comment: NORMAL
Neisseria Gonorrhea: NEGATIVE

## 2023-08-20 NOTE — Telephone Encounter (Signed)
Preadmission screen  

## 2023-08-21 ENCOUNTER — Encounter: Payer: Self-pay | Admitting: Obstetrics and Gynecology

## 2023-08-21 DIAGNOSIS — Z641 Problems related to multiparity: Secondary | ICD-10-CM | POA: Insufficient documentation

## 2023-08-21 LAB — STREP GP B NAA: Strep Gp B NAA: NEGATIVE

## 2023-08-23 ENCOUNTER — Other Ambulatory Visit: Payer: Self-pay | Admitting: Obstetrics

## 2023-08-23 ENCOUNTER — Other Ambulatory Visit: Payer: Self-pay

## 2023-08-23 ENCOUNTER — Ambulatory Visit: Payer: 59 | Attending: Obstetrics | Admitting: *Deleted

## 2023-08-23 ENCOUNTER — Ambulatory Visit: Payer: 59

## 2023-08-23 ENCOUNTER — Ambulatory Visit: Payer: 59 | Admitting: *Deleted

## 2023-08-23 VITALS — BP 106/69 | HR 80

## 2023-08-23 DIAGNOSIS — O0933 Supervision of pregnancy with insufficient antenatal care, third trimester: Secondary | ICD-10-CM | POA: Diagnosis not present

## 2023-08-23 DIAGNOSIS — O24419 Gestational diabetes mellitus in pregnancy, unspecified control: Secondary | ICD-10-CM

## 2023-08-23 DIAGNOSIS — O09523 Supervision of elderly multigravida, third trimester: Secondary | ICD-10-CM | POA: Insufficient documentation

## 2023-08-23 DIAGNOSIS — E669 Obesity, unspecified: Secondary | ICD-10-CM

## 2023-08-23 DIAGNOSIS — O368130 Decreased fetal movements, third trimester, not applicable or unspecified: Secondary | ICD-10-CM

## 2023-08-23 DIAGNOSIS — Z3A37 37 weeks gestation of pregnancy: Secondary | ICD-10-CM

## 2023-08-23 DIAGNOSIS — O26893 Other specified pregnancy related conditions, third trimester: Secondary | ICD-10-CM | POA: Insufficient documentation

## 2023-08-23 DIAGNOSIS — O24414 Gestational diabetes mellitus in pregnancy, insulin controlled: Secondary | ICD-10-CM

## 2023-08-23 DIAGNOSIS — O0990 Supervision of high risk pregnancy, unspecified, unspecified trimester: Secondary | ICD-10-CM

## 2023-08-23 DIAGNOSIS — O0943 Supervision of pregnancy with grand multiparity, third trimester: Secondary | ICD-10-CM

## 2023-08-23 DIAGNOSIS — O99213 Obesity complicating pregnancy, third trimester: Secondary | ICD-10-CM

## 2023-08-23 DIAGNOSIS — O368190 Decreased fetal movements, unspecified trimester, not applicable or unspecified: Secondary | ICD-10-CM

## 2023-08-23 NOTE — Procedures (Signed)
Erika Potts [birth year] [redacted]w[redacted]d  Fetus A Non-Stress Test Interpretation for 08/23/23  Indication: Advanced Maternal Age >40 years  Fetal Heart Rate A Mode: External Baseline Rate (A): 140 bpm Variability: Moderate Accelerations: 15 x 15 Decelerations: None Multiple birth?: No  Uterine Activity Mode: Palpation, Toco Contraction Frequency (min): Erratic Contraction Duration (sec): 30-90 Contraction Quality: Mild Resting Tone Palpated: Relaxed Resting Time: Adequate  Interpretation (Fetal Testing) Nonstress Test Interpretation: Reactive Comments: Dr. Parke Poisson reviewed tracing.

## 2023-08-24 ENCOUNTER — Inpatient Hospital Stay (HOSPITAL_COMMUNITY): Payer: 59

## 2023-08-24 ENCOUNTER — Encounter (HOSPITAL_COMMUNITY): Payer: Self-pay | Admitting: Obstetrics and Gynecology

## 2023-08-24 ENCOUNTER — Inpatient Hospital Stay (HOSPITAL_COMMUNITY)
Admission: RE | Admit: 2023-08-24 | Discharge: 2023-08-26 | DRG: 807 | Disposition: A | Discharge status: Home / Self Care | Payer: 59 | Attending: Obstetrics & Gynecology | Admitting: Obstetrics & Gynecology

## 2023-08-24 DIAGNOSIS — O26893 Other specified pregnancy related conditions, third trimester: Secondary | ICD-10-CM | POA: Diagnosis present

## 2023-08-24 DIAGNOSIS — O24424 Gestational diabetes mellitus in childbirth, insulin controlled: Secondary | ICD-10-CM | POA: Diagnosis not present

## 2023-08-24 DIAGNOSIS — O99210 Obesity complicating pregnancy, unspecified trimester: Secondary | ICD-10-CM | POA: Diagnosis present

## 2023-08-24 DIAGNOSIS — Z641 Problems related to multiparity: Secondary | ICD-10-CM

## 2023-08-24 DIAGNOSIS — O3663X0 Maternal care for excessive fetal growth, third trimester, not applicable or unspecified: Secondary | ICD-10-CM | POA: Diagnosis present

## 2023-08-24 DIAGNOSIS — O09529 Supervision of elderly multigravida, unspecified trimester: Secondary | ICD-10-CM

## 2023-08-24 DIAGNOSIS — O326XX0 Maternal care for compound presentation, not applicable or unspecified: Secondary | ICD-10-CM | POA: Diagnosis not present

## 2023-08-24 DIAGNOSIS — Z3A37 37 weeks gestation of pregnancy: Secondary | ICD-10-CM

## 2023-08-24 DIAGNOSIS — O24419 Gestational diabetes mellitus in pregnancy, unspecified control: Principal | ICD-10-CM

## 2023-08-24 DIAGNOSIS — Z825 Family history of asthma and other chronic lower respiratory diseases: Secondary | ICD-10-CM | POA: Diagnosis not present

## 2023-08-24 DIAGNOSIS — Z3A36 36 weeks gestation of pregnancy: Secondary | ICD-10-CM

## 2023-08-24 DIAGNOSIS — O09523 Supervision of elderly multigravida, third trimester: Secondary | ICD-10-CM | POA: Diagnosis not present

## 2023-08-24 DIAGNOSIS — O0933 Supervision of pregnancy with insufficient antenatal care, third trimester: Secondary | ICD-10-CM | POA: Diagnosis not present

## 2023-08-24 DIAGNOSIS — O24425 Gestational diabetes mellitus in childbirth, controlled by oral hypoglycemic drugs: Principal | ICD-10-CM | POA: Diagnosis present

## 2023-08-24 DIAGNOSIS — O99214 Obesity complicating childbirth: Secondary | ICD-10-CM | POA: Diagnosis present

## 2023-08-24 DIAGNOSIS — O0930 Supervision of pregnancy with insufficient antenatal care, unspecified trimester: Secondary | ICD-10-CM

## 2023-08-24 LAB — TYPE AND SCREEN
ABO/RH(D): B POS
Antibody Screen: NEGATIVE

## 2023-08-24 LAB — CBC
HCT: 31.2 % — ABNORMAL LOW (ref 36.0–46.0)
Hemoglobin: 10 g/dL — ABNORMAL LOW (ref 12.0–15.0)
MCH: 23 pg — ABNORMAL LOW (ref 26.0–34.0)
MCHC: 32.1 g/dL (ref 30.0–36.0)
MCV: 71.7 fL — ABNORMAL LOW (ref 80.0–100.0)
Platelets: 247 10*3/uL (ref 150–400)
RBC: 4.35 MIL/uL (ref 3.87–5.11)
RDW: 16 % — ABNORMAL HIGH (ref 11.5–15.5)
WBC: 6.4 10*3/uL (ref 4.0–10.5)
nRBC: 0 % (ref 0.0–0.2)

## 2023-08-24 LAB — GLUCOSE, CAPILLARY
Glucose-Capillary: 156 mg/dL — ABNORMAL HIGH (ref 70–99)
Glucose-Capillary: 80 mg/dL (ref 70–99)
Glucose-Capillary: 82 mg/dL (ref 70–99)

## 2023-08-24 LAB — RPR: RPR Ser Ql: NONREACTIVE

## 2023-08-24 MED ORDER — ACETAMINOPHEN 325 MG PO TABS: 650.0000 mg | ORAL_TABLET | ORAL | Status: DC | PRN

## 2023-08-24 MED ORDER — SOD CITRATE-CITRIC ACID 500-334 MG/5ML PO SOLN
30.0000 mL | ORAL | Status: DC | PRN
Start: 2023-08-24 — End: 2023-08-24

## 2023-08-24 MED ORDER — OXYTOCIN-SODIUM CHLORIDE 30-0.9 UT/500ML-% IV SOLN
2.5000 [IU]/h | INTRAVENOUS | Status: DC
  Administered 2023-08-24: 2.5 [IU]/h via INTRAVENOUS
  Filled 2023-08-24 (×2): qty 500

## 2023-08-24 MED ORDER — METFORMIN HCL 500 MG PO TABS
1000.0000 mg | ORAL_TABLET | Freq: Two times a day (BID) | ORAL | Status: DC
Start: 2023-08-24 — End: 2023-08-26
  Administered 2023-08-24 – 2023-08-26 (×4): 1000 mg via ORAL
  Filled 2023-08-24 (×6): qty 2

## 2023-08-24 MED ORDER — ONDANSETRON HCL 4 MG/2ML IJ SOLN: 4.0000 mg | Freq: Four times a day (QID) | INTRAMUSCULAR | Status: DC | PRN

## 2023-08-24 MED ORDER — LIDOCAINE HCL (PF) 1 % IJ SOLN: 30.0000 mL | INTRAMUSCULAR | Status: DC | PRN

## 2023-08-24 MED ORDER — BENZOCAINE-MENTHOL 20-0.5 % EX AERO: 1.0000 | INHALATION_SPRAY | CUTANEOUS | Status: DC | PRN

## 2023-08-24 MED ORDER — SENNOSIDES-DOCUSATE SODIUM 8.6-50 MG PO TABS
2.0000 | ORAL_TABLET | Freq: Every day | ORAL | Status: DC
  Administered 2023-08-25 – 2023-08-26 (×2): 2 via ORAL
  Filled 2023-08-24 (×2): qty 2

## 2023-08-24 MED ORDER — LACTATED RINGERS IV SOLN
500.0000 mL | INTRAVENOUS | Status: DC | PRN
  Administered 2023-08-24: 500 mL via INTRAVENOUS

## 2023-08-24 MED ORDER — MISOPROSTOL 50MCG HALF TABLET
50.0000 ug | ORAL_TABLET | Freq: Once | ORAL | Status: AC
  Administered 2023-08-24: 50 ug via ORAL
  Filled 2023-08-24: qty 1

## 2023-08-24 MED ORDER — ACETAMINOPHEN 325 MG PO TABS
650.0000 mg | ORAL_TABLET | ORAL | Status: DC | PRN
Start: 2023-08-24 — End: 2023-08-26

## 2023-08-24 MED ORDER — ONDANSETRON HCL 4 MG PO TABS: 4.0000 mg | ORAL_TABLET | ORAL | Status: DC | PRN

## 2023-08-24 MED ORDER — SIMETHICONE 80 MG PO CHEW: 80.0000 mg | CHEWABLE_TABLET | ORAL | Status: DC | PRN

## 2023-08-24 MED ORDER — LACTATED RINGERS IV SOLN: INTRAVENOUS | Status: DC

## 2023-08-24 MED ORDER — ZOLPIDEM TARTRATE 5 MG PO TABS: 5.0000 mg | ORAL_TABLET | Freq: Every evening | ORAL | Status: DC | PRN

## 2023-08-24 MED ORDER — COCONUT OIL OIL: 1.0000 | TOPICAL_OIL | Status: DC | PRN

## 2023-08-24 MED ORDER — DIBUCAINE (PERIANAL) 1 % EX OINT: 1.0000 | TOPICAL_OINTMENT | CUTANEOUS | Status: DC | PRN

## 2023-08-24 MED ORDER — MISOPROSTOL 25 MCG QUARTER TABLET
25.0000 ug | ORAL_TABLET | Freq: Once | ORAL | Status: AC
  Administered 2023-08-24: 25 ug via VAGINAL
  Filled 2023-08-24: qty 1

## 2023-08-24 MED ORDER — FENTANYL CITRATE (PF) 100 MCG/2ML IJ SOLN: 50.0000 ug | INTRAMUSCULAR | Status: DC | PRN

## 2023-08-24 MED ORDER — IBUPROFEN 600 MG PO TABS
600.0000 mg | ORAL_TABLET | Freq: Four times a day (QID) | ORAL | Status: DC
  Administered 2023-08-25 – 2023-08-26 (×7): 600 mg via ORAL
  Filled 2023-08-24 (×7): qty 1

## 2023-08-24 MED ORDER — TERBUTALINE SULFATE 1 MG/ML IJ SOLN
0.2500 mg | Freq: Once | INTRAMUSCULAR | Status: DC | PRN
  Filled 2023-08-24: qty 1

## 2023-08-24 MED ORDER — ONDANSETRON HCL 4 MG/2ML IJ SOLN: 4.0000 mg | INTRAMUSCULAR | Status: DC | PRN

## 2023-08-24 MED ORDER — WITCH HAZEL-GLYCERIN EX PADS: 1.0000 | MEDICATED_PAD | CUTANEOUS | Status: DC | PRN

## 2023-08-24 MED ORDER — OXYTOCIN BOLUS FROM INFUSION
333.0000 mL | Freq: Once | INTRAVENOUS | Status: AC
  Administered 2023-08-24: 333 mL via INTRAVENOUS

## 2023-08-24 MED ORDER — TRANEXAMIC ACID-NACL 1000-0.7 MG/100ML-% IV SOLN
INTRAVENOUS | Status: AC
  Administered 2023-08-24: 1000 mg
  Filled 2023-08-24: qty 100

## 2023-08-24 MED ORDER — TETANUS-DIPHTH-ACELL PERTUSSIS 5-2.5-18.5 LF-MCG/0.5 IM SUSY: 0.5000 mL | PREFILLED_SYRINGE | Freq: Once | INTRAMUSCULAR | Status: DC

## 2023-08-24 MED ORDER — DIPHENHYDRAMINE HCL 25 MG PO CAPS: 25.0000 mg | ORAL_CAPSULE | Freq: Four times a day (QID) | ORAL | Status: DC | PRN

## 2023-08-24 NOTE — Progress Notes (Signed)
Patient ID: Erika Potts, female   DOB: 10/20/1980, 42 y.o.   MRN: [id number] LABOR PROGRESS NOTE  Patient Name: Virginie Rotar, female   DOB: 12-Dec-1980, 42 y.o.  MRN: [id number]  [id number] at [redacted]w[redacted]d admitted for IOL for A2GDM and LGA  S: She is feeling well and would like to try nitrous for pain control. Has not felt leakage of fluid.  O:  BP (!) 107/54 (BP Location: Right Arm)   Pulse 84   Temp 98.6 F (37 C) (Oral)   Resp 20   Ht 5\' 4"  (1.626 m)   Wt 92.1 kg   LMP 12/07/2022 (Approximate) Comment: States her period was the end of February  BMI 34.84 kg/m  EFM:140bpm/Moderate variability/ 15x15 accels/ None decels CAT: 1 Toco: regular, every 2-4 minutes   CVE: Dilation: 3 Effacement (%): 70 Cervical Position: Posterior Station: -3 Exam by:: Juanetta Snow, RN   A&P:   #Labor: Progressing well. S/p dual cytotec.  Intact, will recheck between 3-4pm and assess for possible AROM if cervix more anterior and head well applied.  #Pain: Nitrous oxide #FWB: CAT 1 #GBS negative #Anticipate vaginal delivery  #A2GDM Restart home metformin 1g BID. Will consider starting novalog intrapartum if glucose checks remain high.  - CBG checks q4hrs  Bryna Colander, MD PGY 2 08/24/23  12:45 PM

## 2023-08-24 NOTE — Discharge Summary (Signed)
Postpartum Discharge Summary     Patient Name: Erika Potts DOB: 08/01/1981 MRN: [id number]  Date of admission: 08/24/2023 Delivery date:08/24/2023 Delivering provider: Lester Kinsman Date of discharge: 08/26/2023  Admitting diagnosis: GDM, class A2 [O24.419] Intrauterine pregnancy: [redacted]w[redacted]d     Secondary diagnosis:  Principal Problem:   GDM, class A2 Active Problems:   AMA (advanced maternal age) multigravida 35+   Obesity affecting pregnancy, antepartum   Late prenatal care affecting pregnancy   Grand multiparity  Additional problems: none    Discharge diagnosis: Term Pregnancy Delivered and GDM A2                                              Post partum procedures: none Augmentation: AROM and Cytotec Complications: None  Hospital course: Induction of Labor With Vaginal Delivery   42 y.o. yo [id number] at [redacted]w[redacted]d was admitted to the hospital 08/24/2023 for induction of labor.  Indication for induction: A2 DM (suspected LGA).  Patient had an uncomplicated labor course. Membrane Rupture Time/Date: 4:09 PM,08/24/2023  Delivery Method:Vaginal, Spontaneous Operative Delivery:N/A Episiotomy: None Lacerations:  None Details of delivery can be found in separate delivery note.  Patient had an uncomplicated postpartum course. Her fasting PPD#1 CBG was 78 .Patient is discharged home 08/26/23.  Newborn Data: Birth date:08/24/2023 Birth time:7:24 PM Gender:Female Living status:Living Apgars:7 ,9  Weight:3771 g (8lb 5oz)  Magnesium Sulfate received: No BMZ received: No Rhophylac:N/A MMR:N/A T-DaP:Given prenatally Flu: No RSV Vaccine received: No Transfusion:No  Immunizations received: Immunization History  Administered Date(s) Administered   Influenza,inj,Quad PF,6+ Mos 07/15/2015, 07/18/2019   Tdap 08/19/2015, 07/03/2019, 06/18/2023    Physical exam  Vitals:   08/25/23 1007 08/25/23 1218 08/25/23 2009 08/26/23 0521  BP: 117/67 120/80 123/74 110/66  Pulse:  91 88 (!) 102 70  Resp: 18 18  17   Temp: 98.2 F (36.8 C) 98.4 F (36.9 C) 98.6 F (37 C) 98.5 F (36.9 C)  TempSrc: Oral Oral Oral Oral  SpO2: 100%  100% 100%  Weight:      Height:       General: alert and cooperative Lochia: appropriate Uterine Fundus: firm Incision: N/A DVT Evaluation: No evidence of DVT seen on physical exam. Labs: Lab Results  Component Value Date   WBC 6.4 08/24/2023   HGB 10.0 (L) 08/24/2023   HCT 31.2 (L) 08/24/2023   MCV 71.7 (L) 08/24/2023   PLT 247 08/24/2023      Latest Ref Rng & Units 12/03/2022   10:12 AM  CMP  Glucose 70 - 99 mg/dL 147   BUN 6 - 24 mg/dL 8   Creatinine 8.29 - 5.62 mg/dL 1.30   Sodium 865 - 784 mmol/L 138   Potassium 3.5 - 5.2 mmol/L 4.4   Chloride 96 - 106 mmol/L 103   CO2 20 - 29 mmol/L 22   Calcium 8.7 - 10.2 mg/dL 9.5   Total Protein 6.0 - 8.5 g/dL 7.1   Total Bilirubin 0.0 - 1.2 mg/dL 0.4   Alkaline Phos 44 - 121 IU/L 61   AST 0 - 40 IU/L 15   ALT 0 - 32 IU/L 13    Edinburgh Score:    08/25/2023    4:49 PM  Edinburgh Postnatal Depression Scale Screening Tool  I have been able to laugh and see the funny side of things. 0  I have  looked forward with enjoyment to things. 0  I have blamed myself unnecessarily when things went wrong. 0  I have been anxious or worried for no good reason. 0  I have felt scared or panicky for no good reason. 0  Things have been getting on top of me. 0  I have been so unhappy that I have had difficulty sleeping. 0  I have felt sad or miserable. 0  I have been so unhappy that I have been crying. 0  The thought of harming myself has occurred to me. 0  Edinburgh Postnatal Depression Scale Total 0   Edinburgh Postnatal Depression Scale Total: 0   After visit meds:  Allergies as of 08/26/2023       Reactions   Seasonal Ic [octacosanol]         Medication List     STOP taking these medications    Accu-Chek Guide test strip Generic drug: glucose blood   Accu-Chek  Softclix Lancets lancets   metFORMIN 500 MG tablet Commonly known as: GLUCOPHAGE       TAKE these medications    ferrous sulfate 325 (65 FE) MG tablet Take 1 tablet (325 mg total) by mouth every other day.   ibuprofen 600 MG tablet Commonly known as: ADVIL Take 1 tablet (600 mg total) by mouth every 6 (six) hours as needed.   multivitamin-prenatal 27-0.8 MG Tabs tablet Take 1 tablet by mouth daily at 12 noon.         Discharge home in stable condition Infant Feeding: Bottle and Breast Infant Disposition:home with mother Discharge instruction: per After Visit Summary and Postpartum booklet. Activity: Advance as tolerated. Pelvic rest for 6 weeks.  Diet: routine diet Future Appointments:No future appointments. Follow up Visit:  Follow-up Information     [facility]. Schedule an appointment as soon as possible for a visit in 6 week(s).   Specialty: Obstetrics and Gynecology Why: For your postpartum appointment, repeat diabetes test, and Pap smear. Contact information: [address] [place] [address] [phone]                [name], CNM  P [facility] Admin Pool Please schedule this patient for Postpartum visit in: 6 weeks with the following provider: Any provider In-Person For C/S patients schedule nurse incision check in weeks 2 weeks: no High risk pregnancy complicated by: A2GDM Delivery mode:  SVD Anticipated Birth Control:  POPs PP Procedures needed: 2hr GTT and routine Pap Schedule Integrated BH visit: no   08/26/2023 [name], CNM 8:19 AM

## 2023-08-24 NOTE — H&P (Addendum)
OBSTETRIC ADMISSION HISTORY AND PHYSICAL  Erika Potts is a 42 y.o. female [phone] with IUP at [redacted]w[redacted]d (dated by LMP, Estimated Date of Delivery: 09/13/23) presenting for IOL for A2GDM and LGA.   She reports +FMs, No LOF, no VB, no blurry vision, headaches or peripheral edema, and RUQ pain.    She plans on breast and bottle feeding. She requests POPs for birth control.  She received her prenatal care at  [facility]     Prenatal History/Complications: A2GDM on metformin; hx of very mild shoulder dystocia in 2013 preg (30 seconds; infant weight 9+2); AMA; grand multip  Past Medical History: Past Medical History:  Diagnosis Date   A2GDM (Glyburide) 07/15/2015   Hgb A1C = 5.6  - true GDM  INDICATION U/S NST/AFI DELIVERY  Diabetes   A1 - good control - 648.83    A2 - good control    Poor control or poor compliance    (Macrosomia or polyhydramnios)    B-C and A2/B - 648.03    D-R-F-T or poor control B-C  20-38  20-38  20-24-28-32-36   20-24-28-32-35-38//fetal echo  20-24-27-30-33-36-38//fetal echo  40  32//2 x wk  32//2 x wk   32//2 x wk   28//BPP wkly then   Abnormal Pap smear    ASCUS 2007   Bilateral low back pain without sciatica 10/30/2019   Gestational diabetes    History of gestational diabetes 08/30/2019   On metformin in pregnancy      Past Surgical History: Past Surgical History:  Procedure Laterality Date   NO PAST SURGERIES      Obstetrical History: OB History     Gravida  7   Para  6   Term  6   Preterm  0   AB  0   Living  6      SAB  0   IAB  0   Ectopic  0   Multiple  0   Live Births  6           Social History Social History   Socioeconomic History   Marital status: Married    Spouse name: Not on file   Number of children: Not on file   Years of education: Not on file   Highest education level: Not on file  Occupational History   Not on file  Tobacco Use   Smoking status: Never   Smokeless tobacco: Never  Vaping  Use   Vaping status: Never Used  Substance and Sexual Activity   Alcohol use: No   Drug use: No   Sexual activity: Yes  Other Topics Concern   Not on file  Social History Narrative   Not on file   Social Determinants of Health   Financial Resource Strain: Not on file  Food Insecurity: No Food Insecurity (08/24/2023)   Hunger Vital Sign    Worried About Running Out of Food in the Last Year: Never true    Ran Out of Food in the Last Year: Never true  Transportation Needs: No Transportation Needs (08/24/2023)   PRAPARE - Administrator, Civil Service (Medical): No    Lack of Transportation (Non-Medical): No  Physical Activity: Not on file  Stress: Not on file  Social Connections: Not on file    Family History: Family History  Problem Relation Age of Onset   Asthma Son     Allergies: Allergies  Allergen Reactions   Seasonal Ic [Octacosanol]  Medications Prior to Admission  Medication Sig Dispense Refill Last Dose   ferrous sulfate 325 (65 FE) MG tablet Take 1 tablet (325 mg total) by mouth every other day. 30 tablet 3 Past Week   metFORMIN (GLUCOPHAGE) 500 MG tablet Take 2 tablets (1,000 mg total) by mouth 2 (two) times daily with a meal. Start with 1 tablet twice a day x 1 week. Then increase to 2 tablets twice a day. 120 tablet 3 08/23/2023   Prenatal Vit-Fe Fumarate-FA (MULTIVITAMIN-PRENATAL) 27-0.8 MG TABS tablet Take 1 tablet by mouth daily at 12 noon. 30 tablet 12 08/23/2023   Accu-Chek Softclix Lancets lancets Use as instructed 100 each 12    glucose blood (ACCU-CHEK GUIDE) test strip Use as instructed 100 each 12      Review of Systems  All systems reviewed and negative except as stated in HPI.  Blood pressure 119/68, pulse 97, temperature 98 F (36.7 C), temperature source Oral, resp. rate 20, height 5\' 4"  (1.626 m), weight 92.1 kg, last menstrual period 12/07/2022, currently breastfeeding. General appearance: alert and cooperative Lungs:  breathing comfortably on room air Heart: regular rate Abdomen: soft, non-tender; gravid Extremities: no edema of bilateral lower extremities  Presentation: cephalic confirmed on bedside US Fetal monitoringBaseline: 135 bpm, Variability: Good {> 6 bpm), and Accelerations: Reactive Uterine activityNone Dilation: Closed (external os 1) Effacement (%): Thick Station: Ballotable Exam by:: Juanetta Snow, RN   Prenatal labs: ABO, Rh: --/--/PENDING (11/12 4098) Antibody: PENDING (11/12 0743) Rubella: 1.27 (09/06 1119) RPR: Non Reactive (09/06 1119)  HBsAg: Negative (09/06 1119)  HIV: Non Reactive (09/06 1119)  GBS: Negative/-- (11/07 0934)  2 hr Glucola failed  Genetic screening  declined  Anatomy US normal anatomy visualized  Last [place]: At 35w - cephalic presentation, anterior placenta, EFW 3075 (91 %tile), AC 97%  Prenatal Transfer Tool  Maternal Diabetes: Yes:  Diabetes Type:  Insulin/Medication controlled Genetic Screening: Declined Maternal Ultrasounds/Referrals: Normal Fetal Ultrasounds or other Referrals:  None Maternal Substance Abuse:  No Significant Maternal Medications:  Meds include: Other: metformin Significant Maternal Lab Results:  Group B Strep negative Number of Prenatal Visits:greater than 3 verified prenatal visits Other Comments:  None  Results for orders placed or performed during the hospital encounter of 08/24/23 (from the past 24 hour(s))  Type and screen   Collection Time: 08/24/23  7:43 AM  Result Value Ref Range   ABO/RH(D) PENDING    Antibody Screen PENDING    Sample Expiration      08/27/2023,2359 Performed at [facility], [address], [place], [place] [address]   CBC   Collection Time: 08/24/23  7:46 AM  Result Value Ref Range   WBC 6.4 4.0 - 10.5 K/uL   RBC 4.35 3.87 - 5.11 MIL/uL   Hemoglobin 10.0 (L) 12.0 - 15.0 g/dL   HCT 78.2 (L) 95.6 - 21.3 %   MCV 71.7 (L) 80.0 - 100.0 fL   MCH 23.0 (L) 26.0 - 34.0 pg   MCHC 32.1 30.0 - 36.0  g/dL   RDW 08.6 (H) 57.8 - 46.9 %   Platelets 247 150 - 400 K/uL   nRBC 0.0 0.0 - 0.2 %  Glucose, capillary   Collection Time: 08/24/23  7:52 AM  Result Value Ref Range   Glucose-Capillary 156 (H) 70 - 99 mg/dL    Patient Active Problem List   Diagnosis Date Noted   Grand multiparity 08/21/2023   LGA (large for gestational age) fetus affecting management of mother, third  trimester, fetus 1 07/19/2023   AMA (advanced maternal age) multigravida 35+ 07/05/2023   Obesity affecting pregnancy, antepartum 07/05/2023   Late prenatal care affecting pregnancy 07/05/2023   Pregnancy with uncertain dates, unspecified trimester 06/22/2023   Supervision of high risk pregnancy, antepartum 06/18/2023   GDM, class A2 11/05/2015   History of shoulder dystocia in prior pregnancy, currently pregnant in third trimester 10/28/2015    Assessment/Plan:  Erika Potts is a 42 y.o. G7P6006 at [redacted]w[redacted]d here for IOL for A2GDM with LGA  #Labor:early latent labor, will induce with dual cytotec. Cephalic fetal presentation confirmed with bedside US #Pain: Nitrous, IV fentanyl and epidural upon patient request  #FWB: Cat 1 #ID:  [id number]  #MOF: both #MOC: desires OCP at 6 week visit #Circ:  Yes   [name], DO PGY2  08/24/23  8:28 AM   CNM attestation:  I have seen and examined this patient; I agree with above documentation in the resident's note.   Erika Potts is a 42 y.o. [id number] here for IOL due to GDMA2  PE: BP 119/68 (BP Location: Left Arm)   Pulse 97   Temp 98 F (36.7 C) (Oral)   Resp 20   Ht 5\' 4"  (1.626 m)   Wt 92.1 kg   LMP 12/07/2022 (Approximate) Comment: States her period was the end of February  BMI 34.84 kg/m  Gen: calm comfortable, NAD Resp: normal effort, no distress Abd: gravid  ROS, labs, PMH reviewed  Plan: -Admit to Labor and Delivery -Plan for dual cytotec to start; potential for repeat cytotec dosing, cervical foley, AROM or Pit depending on her  response -Continue home Metformin (1gm bid) with CBG checks during labor -Anticipate vag delivery  [name] CNM 08/24/2023, 10:14 AM

## 2023-08-24 NOTE — Inpatient Diabetes Management (Signed)
Inpatient Diabetes Program Recommendations  AACE/ADA: New Consensus Statement on Inpatient Glycemic Control (2015)  Target Ranges:  Prepandial:   less than 140 mg/dL      Peak postprandial:   less than 180 mg/dL (1-2 hours)      Critically ill patients:  140 - 180 mg/dL   Lab Results  Component Value Date   GLUCAP 156 (H) 08/24/2023   HGBA1C 6.8 (H) 06/18/2023    Review of Glycemic Control  Latest Reference Range & Units 08/24/23 07:52  Glucose-Capillary 70 - 99 mg/dL 132 (H)  (H): Data is abnormally high  Diabetes history: GDMA2 Outpatient Diabetes medications: Metformin 1000 mg BID Current orders for Inpatient glycemic control: Metformin 1000 mg BID  Inpatient Diabetes Program Recommendations:    Please consider:  Novolog 0-14 units Q4H  Will continue to follow while inpatient.  Thank you, Dulce Sellar, MSN, CDCES Diabetes Coordinator Inpatient Diabetes Program [phone] (team pager from 8a-5p)

## 2023-08-24 NOTE — Progress Notes (Signed)
Patient ID: Sindy Guadeloupe, female   DOB: [birth year], 42 y.o.   MRN: [id number] LABOR PROGRESS NOTE  Patient Name: Luevina Montani, female   DOB: Sep 10, 1981, 42 y.o.  MRN: [id number]  [id number] at [redacted]w[redacted]d admitted for  IOL for A2GDM and LGA  S: She is feeling well, pain well controlled. Not feeling strong contractions.   O:  BP (!) 107/54 (BP Location: Right Arm)   Pulse 84   Temp 98.6 F (37 C) (Oral)   Resp 20   Ht 5\' 4"  (1.626 m)   Wt 92.1 kg   LMP 12/07/2022 (Approximate) Comment: States her period was the end of February  SpO2 100%   BMI 34.84 kg/m  EFM:150bpm/Moderate variability/ 15x15 accels/ None decels CAT: 1 Toco: irregular, every 4-7 minutes   CVE: Dilation: 3 Effacement (%): 50 Cervical Position: Posterior Station: -1 Presentation: Vertex Exam by:: Dr. Bernette Mayers   A&P:   #Labor: Progressing,AROM performed 1600 with clear fluid, patient consents to initiating pitocin if contractions do not pick up with AROM.  #Pain: Nitrous oxide #FWB: CAT 1 #GBS negative #Anticipate vaginal delivery  #A2GDM Restart home metformin 1g BID.  Most recent glucose 80. - CBG checks q4hrs  [name] DO PGY 2  08/24/23  4:13 PM

## 2023-08-25 LAB — GLUCOSE, CAPILLARY: Glucose-Capillary: 78 mg/dL (ref 70–99)

## 2023-08-25 LAB — BIRTH TISSUE RECOVERY COLLECTION (PLACENTA DONATION)

## 2023-08-25 MED ORDER — PRENATAL MULTIVITAMIN CH
1.0000 | ORAL_TABLET | Freq: Every day | ORAL | Status: DC
  Administered 2023-08-25 – 2023-08-26 (×2): 1 via ORAL
  Filled 2023-08-25 (×2): qty 1

## 2023-08-25 NOTE — Lactation Note (Signed)
This note was copied from a baby's chart. Lactation Consultation Note  Patient Name: Erika Potts [id number] Date: 08/25/2023 Age:42 hours Reason for consult: Initial assessment;Early term 37-38.6wks;Maternal endocrine disorder (See MOB: MR hx A2GDM).  MOB feeding choice is breast feeding and supplementing infant with formula. MOB feels infant is latching well to breast, she plans to latch infant first for every feeding to help establish her milk supply and then supplement infant afterwards each feeding. MOB is experienced with breastfeeding see maternal data below. LC did not observe latch due infant recently receiving 20 mls of formula by bottle at 2200 pm. Previously MOB was planing to alternate feeds breast and then formula at the  next feeding, after discussion regarding establishing MOB milk supply, she plans to latch infant 1st for every feeding. MOB knows how to hand express, currently with hand expression colostrum wasn't present at this time.   MOB request, LC fitted MOB with hand pump for prn home use with 24 mm breast flange.   1-MOB plans to continue to BF infant, latching infant 1st for every feeding every 2 to 3 hours, skin to skin. 2- Afterwards her feeding choice Day 1 will offer 10 mls of formula after latching infant at the breast. 3- MOB knows to call RN/LC if she has any BF questions, concerns or need latch assistance. 4- LC discussed importance of maternal rest, balance diet and hydration.   Maternal Data Has patient been taught Hand Expression?: Yes Does the patient have breastfeeding experience prior to this delivery?: Yes How long did the patient breastfeed?: Per MOB, she BF other 6 children for 2 years each.  Feeding Mother's Current Feeding Choice: Breast Milk and Formula  LATCH Score ( assessment was done by RN) Latch: Grasps breast easily, tongue down, lips flanged, rhythmical sucking.  Audible Swallowing: Spontaneous and intermittent  Type of  Nipple: Everted at rest and after stimulation  Comfort (Breast/Nipple): Soft / non-tender  Hold (Positioning): Full assist, staff holds infant at breast  LATCH Score: 8   Lactation Tools Discussed/Used Tools: Pump Breast pump type: Manual Pump Education: Setup, frequency, and cleaning;Milk Storage Reason for Pumping: MOB request for PRN use Pumping frequency: PRN  Interventions    Discharge Pump: Manual (Manual hand pump given for PRN use, MOB fitted with 24 mm breast flange.)  Consult Status Consult Status: Follow-up Date: 08/25/23 Follow-up type: In-patient    Frederico Hamman 08/25/2023, 12:25 AM

## 2023-08-25 NOTE — Progress Notes (Signed)
POSTPARTUM PROGRESS NOTE  Post Partum Day 1  Subjective:  Erika Potts is a 42 y.o. [id number] s/p SVD at [redacted]w[redacted]d.  She reports she is doing well. No acute events overnight. She denies any problems with ambulating, voiding or po intake. Denies nausea or vomiting.  Pain is well controlled.  Lochia is appropriate.  Objective: Blood pressure (!) 114/57, pulse 80, temperature 98.3 F (36.8 C), temperature source Oral, resp. rate 17, height 5\' 4"  (1.626 m), weight 92.1 kg, last menstrual period 12/07/2022, SpO2 100%, unknown if currently breastfeeding.  Physical Exam:  General: alert, cooperative and no distress Chest: no respiratory distress Heart:regular rate, distal pulses intact Abdomen: soft, nontender,  Uterine Fundus: firm, appropriately tender DVT Evaluation: No calf swelling or tenderness Extremities: no edema Skin: warm, dry  Recent Labs    08/24/23 0746  HGB 10.0*  HCT 31.2*    Assessment/Plan: Erika Potts is a 42 y.o. [id number] s/p SVD at [redacted]w[redacted]d   PPD#1 - Doing well  Routine postpartum care  Contraception: POPs Feeding: breast Dispo: Plan for discharge tomorrow.   LOS: 1 day   Derrel Nip, MD  08/25/2023, 10:12 AM

## 2023-08-26 MED ORDER — IBUPROFEN 600 MG PO TABS: 600.0000 mg | ORAL_TABLET | Freq: Four times a day (QID) | ORAL | 0 refills | Status: AC | PRN

## 2023-08-26 NOTE — Lactation Note (Signed)
This note was copied from a baby's chart. Lactation Consultation Note  Patient Name: Boy Edelle Schley [id number] Date: 08/26/2023 Age:42 hours, Experienced BF, P7  Reason for consult: Follow-up assessment;Early term 37-38.6wks;Infant weight loss;Maternal endocrine disorder (6 % weight loss, per mom the baby is in the nursery.) [facility] reviewed supply and demand, importance of giving the baby time to feed at the breast. Feed with feeding cues and by 3 hours.  LC reviewed BF D/C teaching and the [facility] resources.     Feeding Mother's Current Feeding Choice: Breast Milk and Formula Nipple Type: Slow - flow  LATCH Score - 8    Lactation Tools Discussed/Used Tools: Pump Breast pump type: Manual Pump Education: Milk Storage Reason for Pumping: PRN  Interventions Interventions: Breast feeding basics reviewed;Hand pump;LC Services brochure  Discharge Discharge Education: Engorgement and breast care Pump: Manual  Consult Status Consult Status: Complete Date: 08/26/23    Matilde Sprang Jayceon Troy 08/26/2023, 11:19 AM

## 2023-08-27 ENCOUNTER — Encounter: Payer: 59 | Admitting: Obstetrics and Gynecology

## 2023-09-03 ENCOUNTER — Encounter: Payer: 59 | Admitting: Obstetrics and Gynecology

## 2023-09-06 ENCOUNTER — Telehealth (HOSPITAL_COMMUNITY): Payer: Self-pay

## 2023-09-06 NOTE — Telephone Encounter (Signed)
09/06/2023 1022  Name: Frantasia Coulibaly MRN: [id number] DOB: 21-Oct-1980  Reason for Call:  Transition of Care Hospital Discharge Call  Contact Status: Patient Contact Status: Message  Language assistant needed:          Follow-Up Questions:    Inocente Salles Postnatal Depression Scale:  In the Past 7 Days:    PHQ2-9 Depression Scale:     Discharge Follow-up:    Post-discharge interventions: Reviewed Newborn Safe Sleep Practices  Signature  Signe Colt

## 2023-09-07 ENCOUNTER — Encounter: Payer: Self-pay | Admitting: *Deleted

## 2023-10-18 ENCOUNTER — Ambulatory Visit: Payer: 59 | Admitting: Obstetrics and Gynecology

## 2023-11-08 ENCOUNTER — Encounter: Payer: Self-pay | Admitting: Obstetrics and Gynecology

## 2023-11-08 ENCOUNTER — Ambulatory Visit: Payer: Medicaid Other | Admitting: Obstetrics and Gynecology

## 2023-11-08 DIAGNOSIS — Z30011 Encounter for initial prescription of contraceptive pills: Secondary | ICD-10-CM | POA: Diagnosis not present

## 2023-11-08 DIAGNOSIS — Z1239 Encounter for other screening for malignant neoplasm of breast: Secondary | ICD-10-CM | POA: Diagnosis not present

## 2023-11-08 MED ORDER — NORETHINDRONE 0.35 MG PO TABS: 1.0000 | ORAL_TABLET | Freq: Every day | ORAL | 11 refills | Status: AC

## 2023-11-08 NOTE — Progress Notes (Signed)
Post Partum Visit Note  Erika Potts is a 43 y.o. [phone] female who presents for a postpartum visit. She is 10 weeks postpartum following a normal spontaneous vaginal delivery.  I have fully reviewed the prenatal and intrapartum course. The delivery was at 37/1 gestational weeks.  Anesthesia: none. Postpartum course has been uncomplicated. Baby is doing well. Baby is feeding by both breast and bottle - [employer] Good Start. Bleeding staining only. Bowel function is normal. Bladder function is normal. Patient is not sexually active. Contraception method is none. Postpartum depression screening: negative.   The pregnancy intention screening data noted above was reviewed. Potential methods of contraception were discussed. The patient elected to proceed with progesterone only pills.   Edinburgh Postnatal Depression Scale - 11/08/23 1042       Edinburgh Postnatal Depression Scale:  In the Past 7 Days   I have been able to laugh and see the funny side of things. 0    I have looked forward with enjoyment to things. 0    I have blamed myself unnecessarily when things went wrong. 0    I have been anxious or worried for no good reason. 0    I have felt scared or panicky for no good reason. 0    Things have been getting on top of me. 0    I have been so unhappy that I have had difficulty sleeping. 0    I have felt sad or miserable. 0    I have been so unhappy that I have been crying. 0    The thought of harming myself has occurred to me. 0    Edinburgh Postnatal Depression Scale Total 0             Health Maintenance Due  Topic Date Due   FOOT EXAM  Never done   OPHTHALMOLOGY EXAM  Never done   Cervical Cancer Screening (HPV/Pap Cotest)  05/03/2022   INFLUENZA VACCINE  05/13/2023   COVID-19 Vaccine (1 - 2024-25 season) Never done   Diabetic kidney evaluation - eGFR measurement  12/04/2023   Diabetic kidney evaluation - Urine ACR  12/04/2023    The following portions of the  patient's history were reviewed and updated as appropriate: allergies, current medications, past family history, past medical history, past social history, past surgical history, and problem list.  Review of Systems Pertinent items are noted in HPI.  Objective:  BP 117/82   Pulse 84   Ht 5\' 4"  (1.626 m)   Wt 186 lb (84.4 kg)   LMP 12/07/2022 (Approximate) Comment: States her period was the end of February  Breastfeeding Yes   BMI 31.93 kg/m    General:  alert, cooperative, and no distress   Breasts:  not indicated  Lungs: clear to auscultation bilaterally  Heart:  regular rate and rhythm  Abdomen: soft, non-tender; bowel sounds normal; no masses,  no organomegaly   Wound N/a  GU exam:   deferred       Assessment:     normal postpartum exam.   Plan:   Essential components of care per ACOG recommendations:  1.  Mood and well being: Patient with negative depression screening today. Reviewed local resources for support.  - Patient tobacco use? No.   - hx of drug use? No.    2. Infant care and feeding:  -Patient currently breastmilk feeding? Yes. Reviewed importance of draining breast regularly to support lactation.  -Social determinants of health (SDOH) reviewed in EPIC.  No concerns.  3. Sexuality, contraception and birth spacing - Patient does not want a pregnancy in the next year.  Desired family size is 7 children.  - Reviewed reproductive life planning. Reviewed contraceptive methods based on pt preferences and effectiveness.  Patient desired Oral Contraceptive today.   - Discussed birth spacing of 18 months  4. Sleep and fatigue -Encouraged family/partner/community support of 4 hrs of uninterrupted sleep to help with mood and fatigue  5. Physical Recovery  - Discussed patients delivery and complications. She describes her labor as good. - Patient had a Vaginal, no problems at delivery. Patient had no laceration. Perineal healing reviewed. Patient expressed  understanding - Patient has urinary incontinence? No. - Patient is safe to resume physical and sexual activity  6.  Health Maintenance - HM due items addressed Yes - Last pap smear No results found for: "DIAGPAP" Pap smear not done at today's visit. Pt needs a female provider for pap -Breast Cancer screening indicated? Yes. Patient referred today for mammogram.   7. Chronic Disease/Pregnancy Condition follow up: Gestational Diabetes Will schedule separate 2 hour GTT postpartum - PCP follow up  Warden Fillers, MD [facility] [place], [facility]

## 2023-11-08 NOTE — Progress Notes (Signed)
Pt wants BC Pills.

## 2023-11-08 NOTE — Addendum Note (Signed)
Addended by: Henrietta Dine on: 11/08/2023 02:14 PM   Modules accepted: Orders

## 2023-11-23 DIAGNOSIS — Z0289 Encounter for other administrative examinations: Secondary | ICD-10-CM

## 2023-12-07 ENCOUNTER — Ambulatory Visit: Payer: Medicaid Other

## 2023-12-08 ENCOUNTER — Other Ambulatory Visit: Payer: Self-pay

## 2023-12-09 ENCOUNTER — Ambulatory Visit: Payer: Medicaid Other | Admitting: Obstetrics and Gynecology

## 2023-12-09 ENCOUNTER — Other Ambulatory Visit: Payer: Medicaid Other

## 2023-12-09 ENCOUNTER — Other Ambulatory Visit (HOSPITAL_COMMUNITY)
Admission: RE | Admit: 2023-12-09 | Discharge: 2023-12-09 | Disposition: A | Discharge status: Home / Self Care | Source: Ambulatory Visit | Attending: Obstetrics and Gynecology | Admitting: Obstetrics and Gynecology

## 2023-12-09 ENCOUNTER — Other Ambulatory Visit: Payer: Self-pay

## 2023-12-09 VITALS — BP 107/64 | HR 126 | Wt 182.0 lb

## 2023-12-09 DIAGNOSIS — Z01419 Encounter for gynecological examination (general) (routine) without abnormal findings: Secondary | ICD-10-CM | POA: Diagnosis not present

## 2023-12-09 DIAGNOSIS — Z1331 Encounter for screening for depression: Secondary | ICD-10-CM

## 2023-12-09 DIAGNOSIS — Z124 Encounter for screening for malignant neoplasm of cervix: Secondary | ICD-10-CM | POA: Insufficient documentation

## 2023-12-09 DIAGNOSIS — O0990 Supervision of high risk pregnancy, unspecified, unspecified trimester: Secondary | ICD-10-CM

## 2023-12-09 NOTE — Progress Notes (Signed)
 ANNUAL EXAM Patient name: Erika Potts MRN [id number]  Date of birth: 21-Sep-1981 Chief Complaint:   Gynecologic Exam  History of Present Illness:   Erika Potts is a 43 y.o. [id number] being seen today for a routine annual exam.  Current complaints: pap   Menstrual concerns? No   Breast or nipple changes? Yes breastfeeding, occasional soreness Contraception use? Yes POPs Sexually active? Yes no pain   No LMP recorded.   The pregnancy intention screening data noted above was reviewed. Potential methods of contraception were discussed. The patient elected to proceed with No data recorded.   Last pap No results found for: "DIAGPAP", "HPVHIGH", "ADEQPAP"  Last mammogram: 01/2023 BIRADS 1.  Last colonoscopy: n/a.      06/18/2023   11:20 AM 12/03/2022    9:37 AM 09/14/2019    2:11 PM 10/08/2015    1:32 PM 08/27/2015    8:34 AM  Depression screen PHQ 2/9  Decreased Interest 0 0 0 0 0  Down, Depressed, Hopeless 0 0 0 0 0  PHQ - 2 Score 0 0 0 0 0  Altered sleeping 2  1    Tired, decreased energy 2  1    Change in appetite 3  0    Feeling bad or failure about yourself  0  0    Trouble concentrating 0  0    Moving slowly or fidgety/restless 0  0    Suicidal thoughts 0  0    PHQ-9 Score 7  2          06/18/2023   11:20 AM 09/14/2019    2:12 PM  GAD 7 : Generalized Anxiety Score  Nervous, Anxious, on Edge 0 0  Control/stop worrying 0 0  Worry too much - different things 0 0  Trouble relaxing 0 0  Restless 0 0  Easily annoyed or irritable 0 0  Afraid - awful might happen 0 0  Total GAD 7 Score 0 0     Review of Systems:   Pertinent items are noted in HPI Denies any headaches, blurred vision, fatigue, shortness of breath, chest pain, abdominal pain, abnormal vaginal discharge/itching/odor/irritation, problems with periods, bowel movements, urination, or intercourse unless otherwise stated above. Pertinent History Reviewed:  Reviewed past medical,surgical,  social and family history.  Reviewed problem list, medications and allergies. Physical Assessment:   Vitals:   12/09/23 0950  BP: 107/64  Pulse: (!) 126  Weight: 182 lb (82.6 kg)  Body mass index is 31.24 kg/m.        Physical Examination:   General appearance - well appearing, and in no distress  Mental status - alert, oriented to person, place, and time  Psych:  She has a normal mood and affect  Skin - warm and dry, normal color, no suspicious lesions noted  Chest - effort normal, all lung fields clear to auscultation bilaterally  Heart - normal rate and regular rhythm  Abdomen - soft, nontender, nondistended, no masses or organomegaly  Pelvic -  VULVA: normal appearing vulva with no masses, tenderness or lesions   VAGINA: normal appearing vagina with normal color and discharge, no lesions   CERVIX: difficult to visualize, no clear masses appreciated  Thin prep pap is done with HR HPV cotesting  Extremities:  No swelling or varicosities noted  Chaperone present for exam  No results found for this or any previous visit (from the past 24 hours).    Assessment & Plan:  1. Screening for cervical  cancer (Primary) Routine pap collected - Cytology - PAP  2. Well woman exam with routine gynecological exam No concerns POP for contraceptions Mammogram scheduled   No orders of the defined types were placed in this encounter.   Meds: No orders of the defined types were placed in this encounter.   Follow-up: No follow-ups on file.  Lorriane Shire, MD 12/09/2023 9:57 AM

## 2023-12-10 LAB — GLUCOSE TOLERANCE, 2 HOURS
Glucose, 2 hour: 88 mg/dL (ref 70–139)
Glucose, GTT - Fasting: 101 mg/dL — ABNORMAL HIGH (ref 70–99)

## 2023-12-13 LAB — CYTOLOGY - PAP
Comment: NEGATIVE
Diagnosis: NEGATIVE
High risk HPV: NEGATIVE

## 2023-12-17 ENCOUNTER — Telehealth: Payer: Self-pay

## 2023-12-17 NOTE — Telephone Encounter (Addendum)
-----   Message from Warden Fillers sent at 12/10/2023 11:21 AM EST ----- Failed 2 hour GTT, advise referral to PCP for diabetes management   Called pt; results reviewed. Patient states she has previously established PCP and will call them to follow up.

## 2024-01-28 ENCOUNTER — Ambulatory Visit: Payer: Medicaid Other
